# Patient Record
Sex: Female | Born: 1950 | Race: White | Hispanic: No | Marital: Married | Smoking: Former smoker
Health system: Southern US, Community
[De-identification: ages and names within clinical notes are randomized; demographics above are authoritative.]

## PROBLEM LIST (undated history)

## (undated) DIAGNOSIS — M4802 Spinal stenosis, cervical region: Secondary | ICD-10-CM

## (undated) DIAGNOSIS — M419 Scoliosis, unspecified: Secondary | ICD-10-CM

## (undated) DIAGNOSIS — G43909 Migraine, unspecified, not intractable, without status migrainosus: Secondary | ICD-10-CM

## (undated) DIAGNOSIS — K760 Fatty (change of) liver, not elsewhere classified: Secondary | ICD-10-CM

## (undated) DIAGNOSIS — E039 Hypothyroidism, unspecified: Secondary | ICD-10-CM

## (undated) DIAGNOSIS — I499 Cardiac arrhythmia, unspecified: Secondary | ICD-10-CM

## (undated) DIAGNOSIS — J449 Chronic obstructive pulmonary disease, unspecified: Secondary | ICD-10-CM

## (undated) DIAGNOSIS — E119 Type 2 diabetes mellitus without complications: Secondary | ICD-10-CM

## (undated) DIAGNOSIS — M5430 Sciatica, unspecified side: Secondary | ICD-10-CM

## (undated) DIAGNOSIS — F329 Major depressive disorder, single episode, unspecified: Secondary | ICD-10-CM

## (undated) DIAGNOSIS — K259 Gastric ulcer, unspecified as acute or chronic, without hemorrhage or perforation: Secondary | ICD-10-CM

## (undated) DIAGNOSIS — M1990 Unspecified osteoarthritis, unspecified site: Secondary | ICD-10-CM

## (undated) DIAGNOSIS — K219 Gastro-esophageal reflux disease without esophagitis: Secondary | ICD-10-CM

## (undated) DIAGNOSIS — R739 Hyperglycemia, unspecified: Secondary | ICD-10-CM

## (undated) DIAGNOSIS — J45909 Unspecified asthma, uncomplicated: Secondary | ICD-10-CM

## (undated) DIAGNOSIS — F32A Depression, unspecified: Secondary | ICD-10-CM

## (undated) DIAGNOSIS — E559 Vitamin D deficiency, unspecified: Secondary | ICD-10-CM

## (undated) DIAGNOSIS — E785 Hyperlipidemia, unspecified: Secondary | ICD-10-CM

## (undated) DIAGNOSIS — R011 Cardiac murmur, unspecified: Secondary | ICD-10-CM

## (undated) DIAGNOSIS — F419 Anxiety disorder, unspecified: Secondary | ICD-10-CM

## (undated) DIAGNOSIS — M069 Rheumatoid arthritis, unspecified: Secondary | ICD-10-CM

## (undated) DIAGNOSIS — E669 Obesity, unspecified: Secondary | ICD-10-CM

## (undated) DIAGNOSIS — IMO0002 Reserved for concepts with insufficient information to code with codable children: Secondary | ICD-10-CM

## (undated) DIAGNOSIS — T7840XA Allergy, unspecified, initial encounter: Secondary | ICD-10-CM

## (undated) HISTORY — DX: Migraine, unspecified, not intractable, without status migrainosus: G43.909

## (undated) HISTORY — DX: Sciatica, unspecified side: M54.30

## (undated) HISTORY — DX: Cardiac arrhythmia, unspecified: I49.9

## (undated) HISTORY — DX: Scoliosis, unspecified: M41.9

## (undated) HISTORY — DX: Vitamin D deficiency, unspecified: E55.9

## (undated) HISTORY — DX: Gastric ulcer, unspecified as acute or chronic, without hemorrhage or perforation: K25.9

## (undated) HISTORY — DX: Allergy, unspecified, initial encounter: T78.40XA

## (undated) HISTORY — DX: Obesity, unspecified: E66.9

## (undated) HISTORY — DX: Major depressive disorder, single episode, unspecified: F32.9

## (undated) HISTORY — DX: Chronic obstructive pulmonary disease, unspecified: J44.9

## (undated) HISTORY — DX: Unspecified osteoarthritis, unspecified site: M19.90

## (undated) HISTORY — PX: UPPER GI ENDOSCOPY: SHX6162

## (undated) HISTORY — DX: Hyperglycemia, unspecified: R73.9

## (undated) HISTORY — DX: Hypothyroidism, unspecified: E03.9

## (undated) HISTORY — PX: COLONOSCOPY: SHX174

## (undated) HISTORY — DX: Reserved for concepts with insufficient information to code with codable children: IMO0002

## (undated) HISTORY — DX: Anxiety disorder, unspecified: F41.9

## (undated) HISTORY — PX: POLYPECTOMY: SHX149

## (undated) HISTORY — DX: Hyperlipidemia, unspecified: E78.5

## (undated) HISTORY — DX: Fatty (change of) liver, not elsewhere classified: K76.0

## (undated) HISTORY — PX: COLOSTOMY: SHX63

## (undated) HISTORY — PX: CORNEAL TRANSPLANT: SHX108

## (undated) HISTORY — DX: Unspecified asthma, uncomplicated: J45.909

## (undated) HISTORY — PX: UPPER GASTROINTESTINAL ENDOSCOPY: SHX188

## (undated) HISTORY — PX: APPENDECTOMY: SHX54

## (undated) HISTORY — DX: Depression, unspecified: F32.A

## (undated) HISTORY — DX: Rheumatoid arthritis, unspecified: M06.9

---

## 1984-12-02 HISTORY — PX: OTHER SURGICAL HISTORY: SHX169

## 1987-12-03 HISTORY — PX: TOTAL ABDOMINAL HYSTERECTOMY: SHX209

## 1999-03-01 ENCOUNTER — Encounter: Payer: Self-pay | Admitting: *Deleted

## 1999-03-01 ENCOUNTER — Ambulatory Visit (HOSPITAL_COMMUNITY): Admission: RE | Admit: 1999-03-01 | Discharge: 1999-03-01 | Payer: Self-pay | Admitting: *Deleted

## 1999-03-08 ENCOUNTER — Ambulatory Visit (HOSPITAL_COMMUNITY): Admission: RE | Admit: 1999-03-08 | Discharge: 1999-03-08 | Payer: Self-pay | Admitting: *Deleted

## 1999-05-26 ENCOUNTER — Emergency Department (HOSPITAL_COMMUNITY): Admission: EM | Admit: 1999-05-26 | Discharge: 1999-05-26 | Payer: Self-pay

## 2000-02-25 ENCOUNTER — Encounter: Payer: Self-pay | Admitting: Emergency Medicine

## 2000-02-25 ENCOUNTER — Emergency Department (HOSPITAL_COMMUNITY): Admission: EM | Admit: 2000-02-25 | Discharge: 2000-02-25 | Payer: Self-pay | Admitting: Emergency Medicine

## 2000-04-03 ENCOUNTER — Emergency Department (HOSPITAL_COMMUNITY): Admission: EM | Admit: 2000-04-03 | Discharge: 2000-04-03 | Payer: Self-pay | Admitting: Emergency Medicine

## 2000-04-04 ENCOUNTER — Encounter: Payer: Self-pay | Admitting: Emergency Medicine

## 2000-04-04 ENCOUNTER — Emergency Department (HOSPITAL_COMMUNITY): Admission: EM | Admit: 2000-04-04 | Discharge: 2000-04-04 | Payer: Self-pay | Admitting: Emergency Medicine

## 2001-02-12 ENCOUNTER — Other Ambulatory Visit: Admission: RE | Admit: 2001-02-12 | Discharge: 2001-02-12 | Payer: Self-pay | Admitting: Obstetrics & Gynecology

## 2001-02-12 ENCOUNTER — Encounter (INDEPENDENT_AMBULATORY_CARE_PROVIDER_SITE_OTHER): Payer: Self-pay

## 2002-12-02 ENCOUNTER — Emergency Department (HOSPITAL_COMMUNITY): Admission: EM | Admit: 2002-12-02 | Discharge: 2002-12-02 | Payer: Self-pay | Admitting: Emergency Medicine

## 2002-12-29 ENCOUNTER — Other Ambulatory Visit: Admission: RE | Admit: 2002-12-29 | Discharge: 2002-12-29 | Payer: Self-pay | Admitting: Obstetrics & Gynecology

## 2003-02-09 ENCOUNTER — Encounter: Payer: Self-pay | Admitting: Internal Medicine

## 2003-02-09 ENCOUNTER — Encounter: Admission: RE | Admit: 2003-02-09 | Discharge: 2003-02-09 | Payer: Self-pay | Admitting: Internal Medicine

## 2003-09-12 ENCOUNTER — Ambulatory Visit (HOSPITAL_COMMUNITY): Admission: RE | Admit: 2003-09-12 | Discharge: 2003-09-12 | Payer: Self-pay | Admitting: *Deleted

## 2003-09-12 ENCOUNTER — Encounter (INDEPENDENT_AMBULATORY_CARE_PROVIDER_SITE_OTHER): Payer: Self-pay | Admitting: Specialist

## 2004-12-02 HISTORY — PX: HAND SURGERY: SHX662

## 2005-10-04 ENCOUNTER — Encounter: Admission: RE | Admit: 2005-10-04 | Discharge: 2005-10-04 | Payer: Self-pay | Admitting: Internal Medicine

## 2005-10-18 ENCOUNTER — Ambulatory Visit (HOSPITAL_COMMUNITY): Admission: RE | Admit: 2005-10-18 | Discharge: 2005-10-18 | Payer: Self-pay | Admitting: Orthopedic Surgery

## 2005-10-18 ENCOUNTER — Encounter (INDEPENDENT_AMBULATORY_CARE_PROVIDER_SITE_OTHER): Payer: Self-pay | Admitting: *Deleted

## 2005-10-18 ENCOUNTER — Ambulatory Visit (HOSPITAL_BASED_OUTPATIENT_CLINIC_OR_DEPARTMENT_OTHER): Admission: RE | Admit: 2005-10-18 | Discharge: 2005-10-18 | Payer: Self-pay | Admitting: Orthopedic Surgery

## 2007-02-20 ENCOUNTER — Encounter: Admission: RE | Admit: 2007-02-20 | Discharge: 2007-02-20 | Payer: Self-pay | Admitting: Endocrinology

## 2008-03-25 ENCOUNTER — Encounter: Admission: RE | Admit: 2008-03-25 | Discharge: 2008-03-25 | Payer: Self-pay | Admitting: Internal Medicine

## 2008-05-30 ENCOUNTER — Encounter (INDEPENDENT_AMBULATORY_CARE_PROVIDER_SITE_OTHER): Payer: Self-pay | Admitting: *Deleted

## 2008-05-30 ENCOUNTER — Ambulatory Visit (HOSPITAL_COMMUNITY): Admission: RE | Admit: 2008-05-30 | Discharge: 2008-05-30 | Payer: Self-pay | Admitting: *Deleted

## 2008-06-24 ENCOUNTER — Encounter: Admission: RE | Admit: 2008-06-24 | Discharge: 2008-06-24 | Payer: Self-pay | Admitting: Internal Medicine

## 2008-10-21 ENCOUNTER — Encounter: Admission: RE | Admit: 2008-10-21 | Discharge: 2008-10-21 | Payer: Self-pay | Admitting: Internal Medicine

## 2008-11-30 ENCOUNTER — Ambulatory Visit (HOSPITAL_COMMUNITY): Admission: RE | Admit: 2008-11-30 | Discharge: 2008-11-30 | Payer: Self-pay | Admitting: Internal Medicine

## 2008-12-02 HISTORY — PX: CHOLECYSTECTOMY: SHX55

## 2009-01-25 ENCOUNTER — Encounter: Admission: RE | Admit: 2009-01-25 | Discharge: 2009-01-25 | Payer: Self-pay | Admitting: Internal Medicine

## 2009-02-08 ENCOUNTER — Ambulatory Visit (HOSPITAL_COMMUNITY): Admission: RE | Admit: 2009-02-08 | Discharge: 2009-02-09 | Payer: Self-pay | Admitting: Surgery

## 2009-02-08 ENCOUNTER — Encounter (INDEPENDENT_AMBULATORY_CARE_PROVIDER_SITE_OTHER): Payer: Self-pay | Admitting: Surgery

## 2009-05-08 ENCOUNTER — Encounter: Admission: RE | Admit: 2009-05-08 | Discharge: 2009-05-08 | Payer: Self-pay | Admitting: Internal Medicine

## 2009-07-06 ENCOUNTER — Encounter: Admission: RE | Admit: 2009-07-06 | Discharge: 2009-07-06 | Payer: Self-pay | Admitting: Internal Medicine

## 2009-08-30 ENCOUNTER — Encounter: Admission: RE | Admit: 2009-08-30 | Discharge: 2009-08-30 | Payer: Self-pay | Admitting: Internal Medicine

## 2009-12-14 ENCOUNTER — Encounter: Admission: RE | Admit: 2009-12-14 | Discharge: 2009-12-14 | Payer: Self-pay | Admitting: Internal Medicine

## 2011-03-14 LAB — DIFFERENTIAL
Basophils Absolute: 0.1 10*3/uL (ref 0.0–0.1)
Basophils Relative: 1 % (ref 0–1)
Eosinophils Absolute: 0.1 10*3/uL (ref 0.0–0.7)
Eosinophils Relative: 1 % (ref 0–5)
Lymphocytes Relative: 18 % (ref 12–46)
Lymphs Abs: 1.6 10*3/uL (ref 0.7–4.0)
Monocytes Absolute: 0.7 10*3/uL (ref 0.1–1.0)
Monocytes Relative: 8 % (ref 3–12)
Neutro Abs: 6.4 10*3/uL (ref 1.7–7.7)
Neutrophils Relative %: 73 % (ref 43–77)

## 2011-03-14 LAB — URINE MICROSCOPIC-ADD ON

## 2011-03-14 LAB — COMPREHENSIVE METABOLIC PANEL
ALT: 21 U/L (ref 0–35)
AST: 21 U/L (ref 0–37)
Albumin: 3.9 g/dL (ref 3.5–5.2)
Alkaline Phosphatase: 115 U/L (ref 39–117)
BUN: 15 mg/dL (ref 6–23)
CO2: 26 mEq/L (ref 19–32)
Calcium: 9.2 mg/dL (ref 8.4–10.5)
Chloride: 101 mEq/L (ref 96–112)
Creatinine, Ser: 0.67 mg/dL (ref 0.4–1.2)
GFR calc Af Amer: >60 mL/min (ref >60)
GFR calc non Af Amer: >60 mL/min (ref >60)
Glucose, Bld: 106 mg/dL — ABNORMAL HIGH (ref 70–99)
Potassium: 4.3 mEq/L (ref 3.5–5.1)
Sodium: 136 mEq/L (ref 135–145)
Total Bilirubin: 0.8 mg/dL (ref 0.3–1.2)
Total Protein: 6.8 g/dL (ref 6.0–8.3)

## 2011-03-14 LAB — URINALYSIS, ROUTINE W REFLEX MICROSCOPIC
Glucose, UA: NEGATIVE mg/dL
Ketones, ur: NEGATIVE mg/dL
Leukocytes, UA: NEGATIVE
Nitrite: NEGATIVE
Protein, ur: NEGATIVE mg/dL
Specific Gravity, Urine: 1.024 (ref 1.005–1.030)
Urobilinogen, UA: 1 mg/dL (ref 0.0–1.0)
pH: 6 (ref 5.0–8.0)

## 2011-03-14 LAB — CBC
HCT: 44.1 % (ref 36.0–46.0)
Hemoglobin: 15.1 g/dL — ABNORMAL HIGH (ref 12.0–15.0)
MCHC: 34.3 g/dL (ref 30.0–36.0)
MCV: 98 fL (ref 78.0–100.0)
Platelets: 281 10*3/uL (ref 150–400)
RBC: 4.5 MIL/uL (ref 3.87–5.11)
RDW: 12.8 % (ref 11.5–15.5)
WBC: 8.7 10*3/uL (ref 4.0–10.5)

## 2011-03-14 LAB — LIPASE, BLOOD: Lipase: 15 U/L (ref 11–59)

## 2011-04-16 NOTE — Op Note (Signed)
NAME:  Jessica [name], Jessica [name]                  ACCOUNT NO.:  [id number]   MEDICAL RECORD NO.:  [id number]          PATIENT TYPE:  OIB   LOCATION:  1535                         FACILITY:  [facility]   PHYSICIAN:  Currie Paris, M.D.DATE OF BIRTH:  [birth year]   DATE OF PROCEDURE:  02/08/2009  DATE OF DISCHARGE:                               OPERATIVE REPORT   OFFICE MEDICAL RECORD NUMBER CCS [phone].   PREOPERATIVE DIAGNOSIS:  Chronic calculus cholecystitis.   POSTOPERATIVE DIAGNOSIS:  Chronic calculus cholecystitis.   OPERATION:  Laparoscopic cholecystectomy with operative cholangiogram.   SURGEON:  Dr. Jamey Ripa.   ASSISTANT:  Dr. Luisa Hart.   ANESTHESIA:  General.   CLINICAL HISTORY:  This is a 60 year old lady with multiple abdominal  symptoms who has been evaluated.  She has both greasy and spicy food  intolerance.  An ultrasound has showed gallstones, and she elected to  proceed to cholecystectomy.   DESCRIPTION OF PROCEDURE:  The patient was seen in the holding area, and  she had no further questions.  We confirmed cholecystectomy was the  planned procedure.   She was taken to the operating room, and after satisfactory general  endotracheal anesthesia had been obtained, the abdomen was prepped and  draped.  The time out was done.   I infiltrated the umbilical area with 0.25% plain Marcaine, made an  incision, identified the fascia and entered the peritoneal cavity under  direct vision.  There were no significant adhesions noted, and the  Hasson was easily placed and held with a pursestring.   The abdomen was insufflated to 15 and the patient tilted into reverse  Trendelenburg and to the left.  There were a few omental adhesions to  the falciform from an old upper midline scar but virtually no other  adhesions that would prevent injury, so I placed a 10/11 trocar in the  epigastrium and two 5-mm laterally.   The gallbladder was contracted and chronically scarred, consistent with  a  chronic cholecystitis.  There were multiple omental adhesions, and  these were all taken down with cautery.  I was then able to dissect in  the area of the triangle of Calot and could identify the cystic artery  which looked like it was lying just anterior to the cystic duct and  obscuring it.  I freed up the peritoneum so that I had a nice big window  and then went ahead and divided the cystic artery up on the gallbladder  where I could clearly see the anatomy.  With that done, I found another  small branch which I clipped and divided and was able to dissect out a  long segment of cystic duct.   Duct was clipped at the junction of the gallbladder and opened.  A Cook  catheter was introduced and operative cholangiogram done which was  normal.   The cholangiogram catheter was removed and 2 clips placed on the stay  side of the cystic duct, and it was divided.  A couple small branches of  the artery up on the gallbladder were clipped as we removed the  gallbladder from below to above.   The gallbladder was placed in a bag.  We checked to make sure everything  was dry and then brought the gallbladder out the umbilical site.  Final  irrigation was done, and then the lateral ports were removed under  direct vision with no bleeding.  The umbilical site was closed with a  pursestring.  The abdomen was deflated through the epigastric site.  Skin was closed with 4-0 Monocryl subcuticular and Dermabond.   The patient tolerated the procedure well, and there were no  complications.  All counts were correct.      Currie Paris, M.D.  Electronically Signed     CJS/MEDQ  D:  02/08/2009  T:  02/08/2009  Job:  [id number]   cc:   Massie Maroon, MD  Fax: [phone]

## 2011-04-16 NOTE — Op Note (Signed)
NAME:  Jessica [name], Jessica [name]                  ACCOUNT NO.:  [id number]   MEDICAL RECORD NO.:  [id number]          PATIENT TYPE:  AMB   LOCATION:  ENDO                         FACILITY:  [facility]   PHYSICIAN:  Georgiana Spinner, M.D.    DATE OF BIRTH:  06/29/1951   DATE OF PROCEDURE:  05/30/2008  DATE OF DISCHARGE:                               OPERATIVE REPORT   PROCEDURE:  Colonoscopy.   INDICATIONS:  Colon polyps.   ANESTHESIA:  Fentanyl 75 mcg, Versed 5 mg.   PROCEDURE:  With the patient mildly sedated in the left lateral  decubitus position, the Pentax videoscopic pediatric colonoscope was  inserted in the rectum and passed under direct vision to the cecum  identified by the ileocecal valve and appendiceal orifice, both which  were photographed.  From this point the colonoscope was slowly withdrawn  taking circumferential views of colonic mucosa, stopping to photograph  some diverticula seen along until we reached the rectum which appeared  normal on direct and showed hemorrhoids on retroflexed view.  The  endoscope was straightened and withdrawn.  The patient's vital signs and  pulse oximeter remained stable.  The patient tolerated procedure well  without apparent complications.   FINDINGS:  1. Diverticulosis of sigmoid colon.  2. Internal hemorrhoids.   Otherwise unremarkable exam.   PLAN:  Repeat examination in 5 years           ______________________________  Georgiana Spinner, M.D.     GMO/MEDQ  D:  05/30/2008  T:  05/30/2008  Job:  [id number]

## 2011-04-16 NOTE — Op Note (Signed)
NAME:  Jessica [name], Jessica [name]                  ACCOUNT NO.:  [id number]   MEDICAL RECORD NO.:  [id number]          PATIENT TYPE:  AMB   LOCATION:  ENDO                         FACILITY:  [facility]   PHYSICIAN:  Georgiana Spinner, M.D.    DATE OF BIRTH:  June 22, 1951   DATE OF PROCEDURE:  05/30/2008  DATE OF DISCHARGE:                               OPERATIVE REPORT   PROCEDURE:  Upper endoscopy.   INDICATIONS:  Abdominal pain.   ANESTHESIA:  Fentanyl 75 mcg, Versed 7 mg.   PROCEDURE:  With the patient mildly sedated in the left lateral  decubitus position, the Pentax videoscopic endoscope was inserted in the  mouth, passed under direct vision through the esophagus which appeared  normal.  There was 1 area that was questionable for Barrett's which I  elected to biopsy because the esophagus would not fully distend to allow  me to see if this was normal variant.  Subsequently we entered into the  stomach.  Fundus, body, antrum, duodenal bulb, second portion duodenum  all appeared normal.  From this point, the endoscope was slowly  withdrawn taking circumferential views of duodenal mucosa until the  endoscope had been pulled back into stomach, placed in retroflexion to  view the stomach from below.  The endoscope was straightened and  withdrawn taking circumferential views of the remaining gastric and  esophageal mucosa.  The patient's vital signs and pulse oximeter  remained stable.  The patient tolerated procedure well without apparent  complications.   FINDINGS:  Question of Barrett's esophagus versus normal.  Await biopsy  report.  The patient will call me for results and follow up with me as  an outpatient.  Proceed to colonoscopy as planned.           ______________________________  Georgiana Spinner, M.D.     GMO/MEDQ  D:  05/30/2008  T:  05/30/2008  Job:  [id number]

## 2011-04-19 NOTE — Op Note (Signed)
   NAME:  Jessica [name], Jessica [name]                            ACCOUNT NO.:  [id number]   MEDICAL RECORD NO.:  [id number]                   PATIENT TYPE:  AMB   LOCATION:  ENDO                                 FACILITY:  [facility]   PHYSICIAN:  Georgiana Spinner, M.D.                 DATE OF BIRTH:  1951/04/24   DATE OF PROCEDURE:  DATE OF DISCHARGE:                                 OPERATIVE REPORT   PROCEDURE:  Colonoscopy.   ENDOSCOPIST:  Georgiana Spinner, M.D.   ANESTHESIA:  Demerol 40, Versed 4 mg.   INDICATIONS FOR PROCEDURE:  Colon polyp.   DESCRIPTION OF PROCEDURE:  With the patient mildly sedated in the left  lateral decubitus position, the Olympus videoscope colonoscope was inserted  in the rectum and passed under direct vision into the cecum, identified by  the ileocecal valve and appendiceal orifice, both of which were  photographed. In the cecum was [name] small polyp, which was removed using hot  biopsy forceps technique, set on [name] 20/150 on the [employer]  generator.  The endoscope was then withdrawn, taking circumferential views  of the remaining colonic mucosa, stopping only into the rectum, which  appeared normal on direct and showed hemorrhoids on retroflex view. The  endoscope was straightened and withdrawn.  The patient's vital signs and  pulse oximetry remained stable.  The patient tolerated the procedure well,  there were no apparent complications.   FINDINGS:  Polyp of cecum and internal hemorrhoids, otherwise unremarkable  exam.   PLAN:  Patient will call me for results of biopsy and follow up with me as  an outpatient.                                                   Georgiana Spinner, M.D.    GMO/MEDQ  D:  09/12/2003  T:  09/12/2003  Job:  [id number]

## 2011-04-19 NOTE — Op Note (Signed)
   NAME:  Jessica [name], Seerat A                            ACCOUNT NO.:  [id number]   MEDICAL RECORD NO.:  [id number]                   PATIENT TYPE:  AMB   LOCATION:  ENDO                                 FACILITY:  [facility]   PHYSICIAN:  Georgiana Spinner, M.D.                 DATE OF BIRTH:  02-22-1951   DATE OF PROCEDURE:  DATE OF DISCHARGE:                                 OPERATIVE REPORT   PROCEDURE:  Upper endoscopy.   INDICATION:  GERD.   ANESTHESIA:  Demerol 120 mg, Versed 12 mg.   DESCRIPTION OF PROCEDURE:  With the patient mildly sedated in the left  lateral decubitus position, the Olympus videoscopic endoscope was inserted  into the mouth and passed under direct vision through the esophagus which  appeared normal.  There was no evidence of Barrett's.  We entered into the  stomach and the fundus, body, antrum, duodenal bulb, and second portion of  the duodenum all appeared normal.  From this point, the endoscope was slowly  withdrawn, taking circumferential views of the duodenal mucosa until the  endoscope then pulled back into the stomach and placed in retroflexion,  viewing the stomach from below.  The endoscope was straightened and  withdrawn, taking circumferential views of the remaining gastric and  esophageal mucosa.  The patient's vital signs and pulse oximetry remained  stable and the patient tolerated the procedure well without apparent  complications.   FINDINGS:  Unremarkable examination.   PLAN:  Proceed to colonoscopy.                                               Georgiana Spinner, M.D.    GMO/MEDQ  D:  09/12/2003  T:  09/12/2003  Job:  [id number]

## 2011-04-19 NOTE — Op Note (Signed)
NAME:  Jessica [name], Jessica [name]                  ACCOUNT NO.:  [id number]   MEDICAL RECORD NO.:  [id number]          PATIENT TYPE:  AMB   LOCATION:  DSC                          FACILITY:  [facility]   PHYSICIAN:  Katy Fitch. Sypher, M.D. DATE OF BIRTH:  [birth year]   DATE OF PROCEDURE:  10/18/2005  DATE OF DISCHARGE:                                 OPERATIVE REPORT   PREOPERATIVE DIAGNOSIS:  Three month history of swelling right long finger  metacarpal phalangeal joint with periarticular edema stiffness and swelling  consistent with synovitis; rule out seronegative rheumatoid arthritis versus  psoriatic arthritis.   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   OPERATION:  Arthrotomy of right long finger metacarpal phalangeal joint with  incidental Z lengthening of ulnar sagittal fibers to realign extensor  mechanism.  1.  Cheilectomy of metacarpal head.  2.  Injection of right long finger metacarpal phalangeal joint utilizing      Depo-Medrol and 1% lidocaine.   OPERATING SURGEON:  Katy Fitch. Sypher, M.D.   ASSISTANT:  Molly Maduro Dasnoit PA-C.   ANESTHESIA:  General supervised by the anesthesiologist, Dr. Adonis Huguenin.   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   She was referred by Dr. Syliva Overman of [facility] for  evaluation and management of chronic right long finger swelling.   Dr. Jimmy Footman had followed her for two years and had noted polyarthropathy  with signs and symptoms compatible with a possible seronegative rheumatoid  arthritis or possible psoriatic arthritis.   Jessica [name] had been on the previous course of a Azulfidine without  satisfactory release of her symptoms.   During the past three months she developed marked swelling in the region of  her right long  finger metacarpal phalangeal joint with loss of range of  motion in both flexion and extension.  She began to develop slight ulnar  drift of metacarpal phalangeal joint and a slight ulnar translocation of her  extensor mechanism with intrinsic tightness.   She was recently seen by Dr. Jimmy Footman for a consult on September 30, 2005 and  at that time was noted to have the swelling and stiffness of her long finger  metacarpal phalangeal joint and the absence of trauma.   Dr. Jimmy Footman ordered an MRI of the right hand without contrast on October 04, 2005.   This was interpreted by Dr. Jena Gauss to reveal probable osteoarthrosis of the  long finger metacarpal phalangeal joint with significant synovitis and  interosseous ganglion or interosseous cyst.   Due to these findings Dr. Jimmy Footman referred Jessica [name] for a surgical  evaluation.   Essentially, the question was to further define the nature her arthritis  disorder and possibly obtain tissue to confirm whether or not she had an  inflammatory arthropathy.   She was seen in consultation on October 16, 2005, and we reviewed at the  time plain films from 2004 and 2006 as well as her MRI films.  In 2004, Jessica [name] had normal bony anatomy on her plain films of both hands.  On films of 2006, she was noted have cystic change in the ulnar aspect of  her long finger metacarpal head of the right hand and squaring of her  condyle consistent with either  post-traumatic early osteoarthrosis.   Her MRI was remarkable for some considerable periarticular edema and what  appeared to be synovitis and erosive synovitis beneath the ulnar collateral  ligament.   Given the fact that she was developing intrinsic tightness and ulnar drift  there was significant concern that she may indeed have an inflammatory  arthritis.   Dr. Jimmy Footman had advised to consider methotrexate therapy, however, as she  was concerned about possible side effects of methotrexate  there is great  value in determining the nature her arthritis at this time prior to  initiating a particular medical regimen.   After informed consent, she is brought to the operating at this time  anticipating synovial biopsy in an effort to confirm whether not she has an  inflammatory arthropathy.   Preoperatively questions were invited and answered in detail.   DESCRIPTION OF PROCEDURE:  Hiral Lukasiewicz was brought to the operating room and  placed in supine position on the table.   Following anesthesia consultation with Dr. Krista Blue, general anesthesia by LMA  technique was induced.   Examination of the right arm was performed under anesthesia.  The intrinsic  tightness noted in the office was still present but rather than 2+ was now  1+.  She indeed had an ulnar position of her extensor mechanism over the  apex of the long finger metacarpal head and appeared to have increased  tension and her ulnar intrinsic an ulnar sagittal fibers.  She had palpable  osteophytes and synovium within the joint.   After routine Betadine scrub and paint, the right arm was draped with  sterile stockinette and impervious arthroscopy drapes. Ancef 1 gram was  administered as an IV prophylactic antibiotic.  The right arm was  exsanguinated with Esmarch bandage and arterial tourniquet proximal brachium  inflated to 220 mmHg.   The procedure commenced with a short curvilinear scission centered over the  MP joint. The subcutaneous tissues were divided taking care to spare the  cutaneous sensory branches.  The sagittal fibers were carefully inspected  and found be indeed contracted on the ulnar aspect of the MP joint. There  was noted to be a palpable osteophyte on the dorsal ulnar aspect of the  metacarpal head consistent with early osteoarthrosis of post-traumatic  arthrosis.  A step-cut of the ulnar sagittal fibers was accomplished to relieve tension  in the sagittal fibers followed by capsulotomy on the  ulnar aspect of the  joint.  A cheilectomy was performed removing the prominent osteophyte which  was contributing to the displacement of the sagittal fibers.   A complete synovectomy of the MP joint was accomplished utilizing a  microcurette and a fine rongeur. The area deep to the ulnar collateral  ligament origin was carefully inspected. There was synovitis present at this  level that appeared to be mildly inflammatory but not typical of rheumatoid  arthritis.   The typical rheumatoid pannus was not present.   All synovium removed was placed in formalin and passed off for pathologic  evaluation.   The MP joint was then lavaged with a mixture of 1% lidocaine and Depo-Medrol  40 mg/mL followed by repair the capsule with mattress suture of 3-0 Ethibond  and repair of the ulnar sagittal fibers lengthening the sagittal fibers  approximately 3 mm with a step-cut technique.  The long finger extensor was  more centralized over the metacarpal head after these interventions.   The intrinsic tightness was relieved by simple manipulation with  hyperextension of the MP joint and full flexion of the PIP joint.   The wound was then repaired with intradermal 3-0 Prolene followed by  application of voluminous gauze dressing.   For aftercare, we will encourage Jessica [name] to begin immediate range of  motion excises. She is provided postoperative analgesic medication in the  form of Vicodin 5 mg one p.o. q.4-6 hours p.r.n. pain, 20 tablets without  refill.  She will continue in the usual analgesics.   The provisional diagnosis after exploration is that this is a post traumatic  arthrosis rather than inflammatory arthrosis.  This may render use of  methotrexate and other significant anti-inflammatory medications  unnecessary.   At some point in the future, Ms. Eschete may benefit from reconstruction of her  arthritis with an implant arthroplasty.      Katy Fitch Sypher, M.D.  Electronically  Signed     RVS/MEDQ  D:  10/18/2005  T:  10/19/2005  Job:  [id number]   cc:   Lemmie Evens, M.D.  Fax: [phone]

## 2011-08-16 ENCOUNTER — Other Ambulatory Visit: Payer: Self-pay | Admitting: Internal Medicine

## 2011-08-16 DIAGNOSIS — R945 Abnormal results of liver function studies: Secondary | ICD-10-CM

## 2013-02-02 ENCOUNTER — Other Ambulatory Visit: Payer: Self-pay | Admitting: Internal Medicine

## 2013-02-02 DIAGNOSIS — R945 Abnormal results of liver function studies: Secondary | ICD-10-CM

## 2013-03-08 ENCOUNTER — Inpatient Hospital Stay: Admission: RE | Admit: 2013-03-08 | Payer: Self-pay | Source: Ambulatory Visit

## 2014-04-21 ENCOUNTER — Encounter: Payer: Self-pay | Admitting: Gastroenterology

## 2014-06-29 ENCOUNTER — Ambulatory Visit: Payer: Self-pay | Admitting: Gastroenterology

## 2016-07-25 ENCOUNTER — Other Ambulatory Visit: Payer: Self-pay | Admitting: Pain Medicine

## 2016-07-25 DIAGNOSIS — M542 Cervicalgia: Secondary | ICD-10-CM

## 2016-08-03 ENCOUNTER — Ambulatory Visit
Admission: RE | Admit: 2016-08-03 | Discharge: 2016-08-03 | Disposition: A | Discharge status: Home / Self Care | Payer: 59 | Source: Ambulatory Visit | Attending: Pain Medicine | Admitting: Pain Medicine

## 2016-08-03 DIAGNOSIS — M542 Cervicalgia: Secondary | ICD-10-CM

## 2017-01-03 ENCOUNTER — Other Ambulatory Visit: Payer: Self-pay | Admitting: Internal Medicine

## 2017-01-03 DIAGNOSIS — R109 Unspecified abdominal pain: Secondary | ICD-10-CM

## 2017-09-03 ENCOUNTER — Encounter: Payer: Self-pay | Admitting: Gastroenterology

## 2017-10-13 ENCOUNTER — Encounter: Payer: Self-pay | Admitting: Gastroenterology

## 2017-10-13 ENCOUNTER — Ambulatory Visit (INDEPENDENT_AMBULATORY_CARE_PROVIDER_SITE_OTHER): Payer: 59 | Admitting: Gastroenterology

## 2017-10-13 ENCOUNTER — Other Ambulatory Visit (INDEPENDENT_AMBULATORY_CARE_PROVIDER_SITE_OTHER): Payer: 59

## 2017-10-13 ENCOUNTER — Encounter (INDEPENDENT_AMBULATORY_CARE_PROVIDER_SITE_OTHER): Payer: Self-pay

## 2017-10-13 VITALS — BP 130/70 | HR 74 | Ht 66.0 in | Wt 193.1 lb

## 2017-10-13 DIAGNOSIS — R1030 Lower abdominal pain, unspecified: Secondary | ICD-10-CM

## 2017-10-13 DIAGNOSIS — R197 Diarrhea, unspecified: Secondary | ICD-10-CM

## 2017-10-13 DIAGNOSIS — Z86010 Personal history of colonic polyps: Secondary | ICD-10-CM

## 2017-10-13 LAB — CBC WITH DIFFERENTIAL/PLATELET
Basophils Absolute: 0.1 10*3/uL (ref 0.0–0.1)
Basophils Relative: 0.9 % (ref 0.0–3.0)
Eosinophils Absolute: 0 10*3/uL (ref 0.0–0.7)
Eosinophils Relative: 0.3 % (ref 0.0–5.0)
HCT: 42.3 % (ref 36.0–46.0)
Hemoglobin: 14.5 g/dL (ref 12.0–15.0)
Lymphocytes Relative: 15.9 % (ref 12.0–46.0)
Lymphs Abs: 1.4 10*3/uL (ref 0.7–4.0)
MCHC: 34.3 g/dL (ref 30.0–36.0)
MCV: 92.2 fl (ref 78.0–100.0)
Monocytes Absolute: 0.5 10*3/uL (ref 0.1–1.0)
Monocytes Relative: 5.4 % (ref 3.0–12.0)
Neutro Abs: 7 10*3/uL (ref 1.4–7.7)
Neutrophils Relative %: 77.5 % — ABNORMAL HIGH (ref 43.0–77.0)
Platelets: 251 10*3/uL (ref 150.0–400.0)
RBC: 4.59 Mil/uL (ref 3.87–5.11)
RDW: 13.7 % (ref 11.5–15.5)
WBC: 9 10*3/uL (ref 4.0–10.5)

## 2017-10-13 LAB — BASIC METABOLIC PANEL
BUN: 14 mg/dL (ref 6–23)
CO2: 26 mEq/L (ref 19–32)
Calcium: 9.2 mg/dL (ref 8.4–10.5)
Chloride: 100 mEq/L (ref 96–112)
Creatinine, Ser: 0.54 mg/dL (ref 0.40–1.20)
GFR: 120.7 mL/min (ref >60.00)
Glucose, Bld: 311 mg/dL — ABNORMAL HIGH (ref 70–99)
Potassium: 4.4 mEq/L (ref 3.5–5.1)
Sodium: 136 mEq/L (ref 135–145)

## 2017-10-13 LAB — TSH: TSH: 0.11 u[IU]/mL — ABNORMAL LOW (ref 0.35–4.50)

## 2017-10-13 LAB — HEPATIC FUNCTION PANEL
ALT: 21 U/L (ref 0–35)
AST: 20 U/L (ref 0–37)
Albumin: 4 g/dL (ref 3.5–5.2)
Alkaline Phosphatase: 92 U/L (ref 39–117)
Bilirubin, Direct: 0.1 mg/dL (ref 0.0–0.3)
Total Bilirubin: 0.5 mg/dL (ref 0.2–1.2)
Total Protein: 6.9 g/dL (ref 6.0–8.3)

## 2017-10-13 LAB — IGA: IgA: 132 mg/dL (ref 68–378)

## 2017-10-13 NOTE — Patient Instructions (Signed)
Your physician has requested that you go to the basement for lab work before leaving today.  Resume your Robinul twice daily. You already have to prescription at home.   Normal BMI (Body Mass Index- based on height and weight) is between 19 and 25. Your BMI today is Body mass index is 31.17 kg/m. Marland Kitchen Please consider follow up  regarding your BMI with your Primary Care Provider.  Thank you for choosing me and [facility].  Pricilla Riffle. Dagoberto Ligas., MD., Marval Regal

## 2017-10-13 NOTE — Progress Notes (Signed)
History of Present Illness: This is a 66 year old female referred by Jani Gravel, MD for the evaluation of diarrhea.  She relates intermittent diarrhea with periumbilical abdominal pain and then her bowels return to normal bowel pattern with a daily bowel movement without pain.  Diarrhea is often urgent and watery following a meal.  These symptoms have been present for about 6 months.  He has not been taking glycopyrrolate at all for the past several months. She was given an empiric course of Flagyl by Dr. Maudie Mercury however she does not feel her symptoms improved.  Her reflux symptoms are well controlled on omeprazole.  Denies weight loss, constipation, change in stool caliber, melena, hematochezia, nausea, vomiting, dysphagia, chest pain.   Colonoscopy 10/2003, Dr. Lajoyce Corners, colon polyp (adenoma), internal hemorrhoids.  EGD 10/2003, Dr. Lajoyce Corners, unremarkable. EGD 05/2008, Dr Lajoyce Corners, ? Barrett's, otherwise normal - no Barrett on pathology Colonoscopy 05/2008, Dr Lajoyce Corners, diverticulosis, internal hemorrhoids   Allergies  Allergen Reactions  . Remicade [Infliximab] Itching   Outpatient Medications Prior to Visit  Medication Sig Dispense Refill  . butalbital-acetaminophen-caffeine (FIORICET, ESGIC) 50-325-40 MG per tablet Take 1 tablet by mouth 3 (three) times daily as needed for headache.    . Calcium Carbonate-Vitamin D (CALCIUM-VITAMIN D) 500-200 MG-UNIT per tablet Take 1 tablet by mouth daily.    . cyclobenzaprine (FLEXERIL) 10 MG tablet Take 10 mg by mouth 2 (two) times daily as needed for muscle spasms.    . ergocalciferol (VITAMIN D2) 50000 UNITS capsule Take 50,000 Units by mouth once a week.    . Exenatide (BYDUREON [place]) Inject into the skin.    Marland Kitchen FLUoxetine (PROZAC) 40 MG capsule Take 60 mg by mouth daily.     . folic acid (FOLVITE) 1 MG tablet Take 1 mg by mouth daily.    Marland Kitchen gabapentin (NEURONTIN) 300 MG capsule Take 300 mg by mouth 2 (two) times daily.    Marland Kitchen glimepiride (AMARYL) 4 MG tablet Take 4 mg by  mouth daily with breakfast.    . glycopyrrolate (ROBINUL) 2 MG tablet Take 2 mg by mouth 2 (two) times daily.    Marland Kitchen HYDROcodone-acetaminophen (NORCO/VICODIN) 5-325 MG per tablet Take 1 tablet by mouth at bedtime.    Marland Kitchen levothyroxine (SYNTHROID, LEVOTHROID) 137 MCG tablet Take 137 mcg by mouth daily before breakfast.    . methotrexate (RHEUMATREX) 2.5 MG tablet Take 10 mg by mouth once a week. Caution:Chemotherapy. Protect from light.    . Multiple Vitamin (MULTIVITAMIN) tablet Take 1 tablet by mouth daily.    Marland Kitchen omeprazole (PRILOSEC) 20 MG capsule Take 20 mg by mouth daily.    . pioglitazone (ACTOS) 15 MG tablet Take 15 mg by mouth daily.    . predniSONE (DELTASONE) 10 MG tablet Take 10 mg by mouth daily with breakfast.    . propranolol ER (INDERAL LA) 120 MG 24 hr capsule Take 120 mg by mouth daily.    Marland Kitchen inFLIXimab (REMICADE) 100 MG injection Inject 10 mg/kg into the vein every 30 (thirty) days.    . simvastatin (ZOCOR) 40 MG tablet Take 40 mg by mouth daily.     No facility-administered medications prior to visit.    Past Medical History:  Diagnosis Date  . Asthma   . COPD (chronic obstructive pulmonary disease) ([place])   . DDD (degenerative disc disease)   . Depression   . Fatty liver   . Gastric ulcer   . Hyperglycemia   . Hyperlipidemia   . Hypothyroidism   .  Irregular heartbeat   . Migraines   . Multiple gastric ulcers   . Obesity   . Osteoarthritis   . Rheumatoid arthritis ([place])   . Sciatica   . Scoliosis   . Vitamin D deficiency    Past Surgical History:  Procedure Laterality Date  . APPENDECTOMY    . 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 Perforated Colon  1986   by Dr. Margot Chimes  . TOTAL ABDOMINAL HYSTERECTOMY  1989   Dr. Nori Riis   Social History   Socioeconomic History  . Marital status: Married    Spouse name: None  . Number of children: None  . Years of education: None  . Highest education level: None  Social Needs  .  Financial resource strain: None  . Food insecurity - worry: None  . Food insecurity - inability: None  . Transportation needs - medical: None  . Transportation needs - non-medical: None  Occupational History  . Occupation: Inpatient coordinator  Tobacco Use  . Smoking status: Current Every Day Smoker    Types: Cigarettes  . Smokeless tobacco: Never Used  Substance and Sexual Activity  . Alcohol use: No  . Drug use: No  . Sexual activity: None  Other Topics Concern  . None  Social History Narrative  . None   Family History  Problem Relation Age of Onset  . Heart attack Father   . Heart disease Mother   . Hypertension Mother   . Breast cancer Sister   . Diabetes Brother       Review of Systems: Pertinent positive and negative review of systems were noted in the above HPI section. All other review of systems were otherwise negative.   Physical Exam: General: Well developed, well nourished, no acute distress Head: Normocephalic and atraumatic Eyes:  sclerae anicteric, EOMI Ears: Normal auditory acuity Mouth: No deformity or lesions Neck: Supple, no masses or thyromegaly Lungs: Clear throughout to auscultation Heart: Regular rate and rhythm; no murmurs, rubs or bruits Abdomen: Soft, non tender and non distended. No masses, hepatosplenomegaly or hernias noted. Normal Bowel sounds Musculoskeletal: Symmetrical with no gross deformities  Skin: No lesions on visible extremities Pulses:  Normal pulses noted Extremities: No clubbing, cyanosis, edema or deformities noted Neurological: Alert oriented x 4, grossly nonfocal Cervical Nodes:  No significant cervical adenopathy Inguinal Nodes: No significant inguinal adenopathy Psychological:  Alert and cooperative. Normal mood and affect  Assessment and Recommendations:  1. Diarrhea and lower abdominal pain. Probable IBS. R/O infection, IBD, etc. CMP, CBC, TSH, tTG, IgA and GI pathogen panel.  Resume glycopyrrolate 2 mg twice  daily. RE in 2 months.  2.  Personal history of adenomatous colon polyps.  Overdue for surveillance colonoscopy.  Plan to proceed with colonoscopy at her return office visit.  3.  GERD.  Continue omeprazole 20 mg daily and follow standard antireflux measures.  cc: Jani Gravel, MD

## 2017-10-14 LAB — TISSUE TRANSGLUTAMINASE, IGA: (tTG) Ab, IgA: 1 U/mL

## 2017-10-15 ENCOUNTER — Other Ambulatory Visit: Payer: 59

## 2017-10-15 DIAGNOSIS — R1030 Lower abdominal pain, unspecified: Secondary | ICD-10-CM

## 2017-10-15 DIAGNOSIS — R197 Diarrhea, unspecified: Secondary | ICD-10-CM

## 2017-10-20 LAB — GASTROINTESTINAL PATHOGEN PANEL PCR
C. difficile Tox A/B, PCR: NOT DETECTED
Campylobacter, PCR: NOT DETECTED
Cryptosporidium, PCR: NOT DETECTED
E coli (ETEC) LT/ST PCR: NOT DETECTED
E coli (STEC) stx1/stx2, PCR: NOT DETECTED
E coli 0157, PCR: NOT DETECTED
Giardia lamblia, PCR: NOT DETECTED
Norovirus, PCR: NOT DETECTED
Rotavirus A, PCR: NOT DETECTED
Salmonella, PCR: NOT DETECTED
Shigella, PCR: NOT DETECTED

## 2017-10-29 ENCOUNTER — Other Ambulatory Visit: Payer: Self-pay | Admitting: Neurosurgery

## 2017-12-03 NOTE — Pre-Procedure Instructions (Signed)
Jessica [name]  12/03/2017      [facility] [address] [place], [place] [address] Phone: [phone] Fax: [phone]    Your procedure is scheduled on Mon. Jan. 7  Report to [facility] Admitting at 11:20 A.M.  Call this number if you have problems the morning of surgery:  [phone]   Remember:  Do not eat food or drink liquids after midnight on Sun. Jan. 6  Take these medicines the morning of surgery with A SIP OF WATER :cyclobenzaprine (flexeril), fluoxetine (prozac),gabapentin (neurontin), hydrocodone if needed,levothyroxine (synthroid), omeprazole(prilosec), prednisone (deltasone)                How to Manage Your Diabetes Before and After Surgery  Why is it important to control my blood sugar before and after surgery? . Improving blood sugar levels before and after surgery helps healing and can limit problems. . A way of improving blood sugar control is eating a healthy diet by: o  Eating less sugar and carbohydrates o  Increasing activity/exercise o  Talking with your doctor about reaching your blood sugar goals . High blood sugars (greater than 180 mg/dL) can raise your risk of infections and slow your recovery, so you will need to focus on controlling your diabetes during the weeks before surgery. . Make sure that the doctor who takes care of your diabetes knows about your planned surgery including the date and location.  How do I manage my blood sugar before surgery? . Check your blood sugar at least 4 times a day, starting 2 days before surgery, to make sure that the level is not too high or low. o Check your blood sugar the morning of your surgery when you wake up and every 2 hours until you get to the Short Stay unit. . If your blood sugar is less than 70 mg/dL, you will need to treat for low blood sugar: o Do not take insulin. o Treat a low blood sugar (less than 70 mg/dL) with  cup of  clear juice (cranberry or apple), 4 glucose tablets, OR glucose gel. Recheck blood sugar in 15 minutes after treatment (to make sure it is greater than 70 mg/dL). If your blood sugar is not greater than 70 mg/dL on recheck, call [phone] o  for further instructions. . Report your blood sugar to the short stay nurse when you get to Short Stay.  . If you are admitted to the hospital after surgery: o Your blood sugar will be checked by the staff and you will probably be given insulin after surgery (instead of oral diabetes medicines) to make sure you have good blood sugar levels. o The goal for blood sugar control after surgery is 80-180 mg/dL.      WHAT DO I DO ABOUT MY DIABETES MEDICATION?   Marland Kitchen Do not take oral diabetes medicines (pills) the morning of surgery.    Do not wear jewelry, make-up or nail polish.  Do not wear lotions, powders, or perfumes, or deodorant.  Do not shave 48 hours prior to surgery.  Men may shave face and neck.  Do not bring valuables to the hospital.  [facility] is not responsible for any belongings or valuables.  Contacts, dentures or bridgework may not be worn into surgery.  Leave your suitcase in the car.  After surgery it may be brought to your room.  For patients admitted to the hospital, discharge time  will be determined by your treatment team.  Patients discharged the day of surgery will not be allowed to drive home.   Special instructions:  [facility]- Preparing For Surgery  Before surgery, you can play an important role. Because skin is not sterile, your skin needs to be as free of germs as possible. You can reduce the number of germs on your skin by washing with CHG (chlorahexidine gluconate) Soap before surgery.  CHG is an antiseptic cleaner which kills germs and bonds with the skin to continue killing germs even after washing.  Please do not use if you have an allergy to CHG or antibacterial soaps. If your skin becomes reddened/irritated  stop using the CHG.  Do not shave (including legs and underarms) for at least 48 hours prior to first CHG shower. It is OK to shave your face.  Please follow these instructions carefully.   1. Shower the NIGHT BEFORE SURGERY and the MORNING OF SURGERY with CHG.   2. If you chose to wash your hair, wash your hair first as usual with your normal shampoo.  3. After you shampoo, rinse your hair and body thoroughly to remove the shampoo.  4. Use CHG as you would any other liquid soap. You can apply CHG directly to the skin and wash gently with a scrungie or a clean washcloth.   5. Apply the CHG Soap to your body ONLY FROM THE NECK DOWN.  Do not use on open wounds or open sores. Avoid contact with your eyes, ears, mouth and genitals (private parts). Wash Face and genitals (private parts)  with your normal soap.  6. Wash thoroughly, paying special attention to the area where your surgery will be performed.  7. Thoroughly rinse your body with warm water from the neck down.  8. DO NOT shower/wash with your normal soap after using and rinsing off the CHG Soap.  9. Pat yourself dry with a CLEAN TOWEL.  10. Wear CLEAN PAJAMAS to bed the night before surgery, wear comfortable clothes the morning of surgery  11. Place CLEAN SHEETS on your bed the night of your first shower and DO NOT SLEEP WITH PETS.    Day of Surgery: Do not apply any deodorants/lotions. Please wear clean clothes to the hospital/surgery center.      Please read over the following fact sheets that you were given. Coughing and Deep Breathing and Surgical Site Infection Prevention

## 2017-12-04 ENCOUNTER — Encounter (HOSPITAL_COMMUNITY)
Admission: RE | Admit: 2017-12-04 | Discharge: 2017-12-04 | Disposition: A | Discharge status: Home / Self Care | Payer: 59 | Source: Ambulatory Visit | Attending: Neurosurgery | Admitting: Neurosurgery

## 2017-12-04 ENCOUNTER — Encounter (HOSPITAL_COMMUNITY): Payer: Self-pay | Admitting: *Deleted

## 2017-12-04 ENCOUNTER — Other Ambulatory Visit: Payer: Self-pay

## 2017-12-04 DIAGNOSIS — K760 Fatty (change of) liver, not elsewhere classified: Secondary | ICD-10-CM | POA: Diagnosis not present

## 2017-12-04 DIAGNOSIS — Z9049 Acquired absence of other specified parts of digestive tract: Secondary | ICD-10-CM | POA: Diagnosis not present

## 2017-12-04 DIAGNOSIS — G43909 Migraine, unspecified, not intractable, without status migrainosus: Secondary | ICD-10-CM | POA: Diagnosis not present

## 2017-12-04 DIAGNOSIS — M419 Scoliosis, unspecified: Secondary | ICD-10-CM | POA: Diagnosis not present

## 2017-12-04 DIAGNOSIS — Z8711 Personal history of peptic ulcer disease: Secondary | ICD-10-CM | POA: Insufficient documentation

## 2017-12-04 DIAGNOSIS — E119 Type 2 diabetes mellitus without complications: Secondary | ICD-10-CM | POA: Insufficient documentation

## 2017-12-04 DIAGNOSIS — M069 Rheumatoid arthritis, unspecified: Secondary | ICD-10-CM | POA: Diagnosis not present

## 2017-12-04 DIAGNOSIS — Z7984 Long term (current) use of oral hypoglycemic drugs: Secondary | ICD-10-CM | POA: Insufficient documentation

## 2017-12-04 DIAGNOSIS — K219 Gastro-esophageal reflux disease without esophagitis: Secondary | ICD-10-CM | POA: Diagnosis not present

## 2017-12-04 DIAGNOSIS — Z79899 Other long term (current) drug therapy: Secondary | ICD-10-CM | POA: Insufficient documentation

## 2017-12-04 DIAGNOSIS — J449 Chronic obstructive pulmonary disease, unspecified: Secondary | ICD-10-CM | POA: Diagnosis not present

## 2017-12-04 DIAGNOSIS — E039 Hypothyroidism, unspecified: Secondary | ICD-10-CM | POA: Insufficient documentation

## 2017-12-04 DIAGNOSIS — Z90710 Acquired absence of both cervix and uterus: Secondary | ICD-10-CM | POA: Diagnosis not present

## 2017-12-04 DIAGNOSIS — Z01818 Encounter for other preprocedural examination: Secondary | ICD-10-CM | POA: Diagnosis present

## 2017-12-04 DIAGNOSIS — F329 Major depressive disorder, single episode, unspecified: Secondary | ICD-10-CM | POA: Diagnosis not present

## 2017-12-04 DIAGNOSIS — Z7952 Long term (current) use of systemic steroids: Secondary | ICD-10-CM | POA: Insufficient documentation

## 2017-12-04 DIAGNOSIS — Z01812 Encounter for preprocedural laboratory examination: Secondary | ICD-10-CM | POA: Diagnosis not present

## 2017-12-04 DIAGNOSIS — Z79890 Hormone replacement therapy (postmenopausal): Secondary | ICD-10-CM | POA: Insufficient documentation

## 2017-12-04 HISTORY — DX: Gastro-esophageal reflux disease without esophagitis: K21.9

## 2017-12-04 HISTORY — DX: Cardiac murmur, unspecified: R01.1

## 2017-12-04 HISTORY — DX: Type 2 diabetes mellitus without complications: E11.9

## 2017-12-04 LAB — HEMOGLOBIN A1C
Hgb A1c MFr Bld: 7.8 % — ABNORMAL HIGH (ref 4.8–5.6)
Mean Plasma Glucose: 177.16 mg/dL

## 2017-12-04 LAB — BASIC METABOLIC PANEL
Anion gap: 10 (ref 5–15)
BUN: 9 mg/dL (ref 6–20)
CO2: 25 mmol/L (ref 22–32)
Calcium: 9.3 mg/dL (ref 8.9–10.3)
Chloride: 104 mmol/L (ref 101–111)
Creatinine, Ser: 0.46 mg/dL (ref 0.44–1.00)
GFR calc Af Amer: >60 mL/min (ref >60)
GFR calc non Af Amer: >60 mL/min (ref >60)
Glucose, Bld: 106 mg/dL — ABNORMAL HIGH (ref 65–99)
Potassium: 3.8 mmol/L (ref 3.5–5.1)
Sodium: 139 mmol/L (ref 135–145)

## 2017-12-04 LAB — SURGICAL PCR SCREEN
MRSA, PCR: NEGATIVE
Staphylococcus aureus: POSITIVE — AB

## 2017-12-04 LAB — CBC WITH DIFFERENTIAL/PLATELET
Basophils Absolute: 0 10*3/uL (ref 0.0–0.1)
Basophils Relative: 0 %
Eosinophils Absolute: 0 10*3/uL (ref 0.0–0.7)
Eosinophils Relative: 0 %
HCT: 44.3 % (ref 36.0–46.0)
Hemoglobin: 14.9 g/dL (ref 12.0–15.0)
Lymphocytes Relative: 20 %
Lymphs Abs: 2.4 10*3/uL (ref 0.7–4.0)
MCH: 31.8 pg (ref 26.0–34.0)
MCHC: 33.6 g/dL (ref 30.0–36.0)
MCV: 94.5 fL (ref 78.0–100.0)
Monocytes Absolute: 0.8 10*3/uL (ref 0.1–1.0)
Monocytes Relative: 7 %
Neutro Abs: 8.6 10*3/uL — ABNORMAL HIGH (ref 1.7–7.7)
Neutrophils Relative %: 73 %
Platelets: 241 10*3/uL (ref 150–400)
RBC: 4.69 MIL/uL (ref 3.87–5.11)
RDW: 14.6 % (ref 11.5–15.5)
WBC: 11.9 10*3/uL — ABNORMAL HIGH (ref 4.0–10.5)

## 2017-12-04 LAB — GLUCOSE, CAPILLARY: Glucose-Capillary: 146 mg/dL — ABNORMAL HIGH (ref 65–99)

## 2017-12-04 NOTE — Pre-Procedure Instructions (Signed)
Jessica [name]  12/04/2017      [facility] [address], [place] [address] Phone: [phone] Fax: [phone]    Your procedure is scheduled on December 08, 2017.  Report to [facility] Admitting at 11:20 A.M.  Call this number if you have problems the morning of surgery:  [phone]   Remember:  Do not eat food or drink liquids after midnight.   Take these medicines the morning of surgery with A SIP OF WATER :  Fioricet if needed Cyclobenzaprine (Flexeril) if needed Fluoxetine (Prozac) Gabapentin (Neurontin) Hydrocodone-acetaminophen (Norco) if needed Levothyroxine (Synthroid) Omeprazole (Prilosec) Prednisone Propranolol ER (Inderal LA)   **DO NOT TAKE Pioglitazone (ACTOS) or Metformin (GLUCOPHAGE-XR) the morning of surgery**   7 days prior to surgery STOP taking any Aspirin(unless otherwise instructed by your surgeon), Aleve, Naproxen, Ibuprofen, Motrin, Advil, Goody's, BC's, all herbal medications, fish oil, and all vitamins      How to Manage Your Diabetes Before and After Surgery  Why is it important to control my blood sugar before and after surgery? . Improving blood sugar levels before and after surgery helps healing and can limit problems. . A way of improving blood sugar control is eating a healthy diet by: o  Eating less sugar and carbohydrates o  Increasing activity/exercise o  Talking with your doctor about reaching your blood sugar goals . High blood sugars (greater than 180 mg/dL) can raise your risk of infections and slow your recovery, so you will need to focus on controlling your diabetes during the weeks before surgery. . Make sure that the doctor who takes care of your diabetes knows about your planned surgery including the date and location.  How do I manage my blood sugar before surgery? . Check your blood sugar at least 4 times a day, starting 2 days before  surgery, to make sure that the level is not too high or low. o Check your blood sugar the morning of your surgery when you wake up and every 2 hours until you get to the Short Stay unit. . If your blood sugar is less than 70 mg/dL, you will need to treat for low blood sugar: o Do not take insulin. o Treat a low blood sugar (less than 70 mg/dL) with  cup of clear juice (cranberry or apple), 4 glucose tablets, OR glucose gel. Recheck blood sugar in 15 minutes after treatment (to make sure it is greater than 70 mg/dL). If your blood sugar is not greater than 70 mg/dL on recheck, call [phone] o  for further instructions. . Report your blood sugar to the short stay nurse when you get to Short Stay.  . If you are admitted to the hospital after surgery: o Your blood sugar will be checked by the staff and you will probably be given insulin after surgery (instead of oral diabetes medicines) to make sure you have good blood sugar levels. o The goal for blood sugar control after surgery is 80-180 mg/dL.     Do not wear jewelry, make-up or nail polish.  Do not wear lotions, powders, or perfumes, or deoderant.  Do not shave 48 hours prior to surgery.    Do not bring valuables to the hospital.   [facility] is not responsible for any belongings or valuables.  Contacts, dentures or bridgework may not be worn into surgery.  Leave your suitcase in the car.  After surgery  it may be brought to your room.  For patients admitted to the hospital, discharge time will be determined by your treatment team.  Patients discharged the day of surgery will not be allowed to drive home.  Special instructions:   [facility]- Preparing For Surgery  Before surgery, you can play an important role. Because skin is not sterile, your skin needs to be as free of germs as possible. You can reduce the number of germs on your skin by washing with CHG (chlorahexidine gluconate) Soap before surgery.  CHG is an antiseptic  cleaner which kills germs and bonds with the skin to continue killing germs even after washing.  Please do not use if you have an allergy to CHG or antibacterial soaps. If your skin becomes reddened/irritated stop using the CHG.  Do not shave (including legs and underarms) for at least 48 hours prior to first CHG shower. It is OK to shave your face.  Please follow these instructions carefully.   1. Shower the NIGHT BEFORE SURGERY and the MORNING OF SURGERY with CHG.   2. If you chose to wash your hair, wash your hair first as usual with your normal shampoo.  3. After you shampoo, rinse your hair and body thoroughly to remove the shampoo.  4. Use CHG as you would any other liquid soap. You can apply CHG directly to the skin and wash gently with a scrungie or a clean washcloth.   5. Apply the CHG Soap to your body ONLY FROM THE NECK DOWN.  Do not use on open wounds or open sores. Avoid contact with your eyes, ears, mouth and genitals (private parts). Wash Face and genitals (private parts)  with your normal soap.  6. Wash thoroughly, paying special attention to the area where your surgery will be performed.  7. Thoroughly rinse your body with warm water from the neck down.  8. DO NOT shower/wash with your normal soap after using and rinsing off the CHG Soap.  9. Pat yourself dry with a CLEAN TOWEL.  10. Wear CLEAN PAJAMAS to bed the night before surgery, wear comfortable clothes the morning of surgery  11. Place CLEAN SHEETS on your bed the night of your first shower and DO NOT SLEEP WITH PETS.    Day of Surgery: Do not apply any deodorants/lotions. Please wear clean clothes to the hospital/surgery center.      Please read over the following fact sheets that you were given. Coughing and Deep Breathing and Surgical Site Infection Prevention

## 2017-12-04 NOTE — Progress Notes (Signed)
PCP: Dr. Jani Gravel  EKG: Today CXR: Denies ECHO: Denies Stress Test: Denies Cardiac Cath: Denies  Pt doesn't know typical fasting blood sugars, she reports checking CBG "every few months."    Patient denies shortness of breath, fever, cough, and chest pain at PAT appointment.  Patient verbalized understanding of instructions provided today at the PAT appointment.  Patient asked to review instructions at home and day of surgery.

## 2017-12-05 NOTE — Progress Notes (Addendum)
Anesthesia Chart Review: Patient is a 67 year old female scheduled for C4-5, C5-6, C6-7 ACDF on 12/08/17 by Dr. Earnie Larsson.  History includes smoking, COPD, asthma, RA, osteoarthritis, scoliosis, DM2, irregular heartbeat (not specified; PVCs on EKG), murmur, depression, hypothyroidism, migraines, GERD, gastric ulcers, fatty liver, hysterectomy '89, cholecystectomy '10, appendectomy, perforated colon surgery '86, corneal transplant '70's. BMI is consistent with obesity.  - PCP is Dr. Jani Gravel at [facility]. (09/03/17 office note scanned under Media tab.). - GI is Dr. Lucio Edward.  Meds include Xanax, Fioricet, Exenatide ER, Vytorin, Prozac, Neurontin, Amaryl, Robinul, Norco, levothyroxine, Glucophage XR, methotrexate, Remeron, Prilosec, Actos, prednisone, Inderal LA.  BP 132/71   Pulse 76   Temp 36.9 C   Resp 20   Ht 5\' 6"  (1.676 m)   Wt 196 lb 9.6 oz (89.2 kg)   SpO2 95%   BMI 31.73 kg/m    EKG 12/04/17: SR with occasional PVCs, non-specific ST abnormality. No prior EKG in [facility] or [facility]. She denied chest pain, SOB at PAT.   Preoperative labs noted. Cr 0.46. WBC 11.9, H/H 14.9/44.3. PLT 241. A1c 7.8. LFTS were WNL on 10/13/17.   I was not asked to evaluate patient during PAT. She did list history of murmur, but otherwise no details. She denied a prior echo, stress, and cath. By 10/13/17 notes, Dr. Fuller Plan did not auscultate a murmur on exam, and by 09/03/17 notes by Dr. Jani Gravel, there was no murmur on exam. Anesthesiologist to evaluate on the day of surgery, but based on currently available information, I would anticipate that she can proceed as planned If no acute changes.   George Hugh [facility] Short Stay Center/Anesthesiology Phone [phone] 12/05/2017 11:15 AM

## 2017-12-08 ENCOUNTER — Inpatient Hospital Stay (HOSPITAL_COMMUNITY): Payer: 59 | Admitting: Certified Registered"

## 2017-12-08 ENCOUNTER — Encounter (HOSPITAL_COMMUNITY): Payer: Self-pay

## 2017-12-08 ENCOUNTER — Inpatient Hospital Stay (HOSPITAL_COMMUNITY): Payer: 59 | Admitting: Vascular Surgery

## 2017-12-08 ENCOUNTER — Encounter (HOSPITAL_COMMUNITY)
Admission: RE | Disposition: A | Discharge status: Home / Self Care | Payer: Self-pay | Source: Ambulatory Visit | Attending: Neurosurgery

## 2017-12-08 ENCOUNTER — Inpatient Hospital Stay (HOSPITAL_COMMUNITY)
Admission: RE | Admit: 2017-12-08 | Discharge: 2017-12-09 | DRG: 473 | Disposition: A | Discharge status: Home / Self Care | Payer: 59 | Source: Ambulatory Visit | Attending: Neurosurgery | Admitting: Neurosurgery

## 2017-12-08 ENCOUNTER — Other Ambulatory Visit: Payer: Self-pay

## 2017-12-08 ENCOUNTER — Inpatient Hospital Stay (HOSPITAL_COMMUNITY): Payer: 59

## 2017-12-08 DIAGNOSIS — K219 Gastro-esophageal reflux disease without esophagitis: Secondary | ICD-10-CM | POA: Diagnosis present

## 2017-12-08 DIAGNOSIS — M5030 Other cervical disc degeneration, unspecified cervical region: Secondary | ICD-10-CM | POA: Diagnosis present

## 2017-12-08 DIAGNOSIS — E785 Hyperlipidemia, unspecified: Secondary | ICD-10-CM | POA: Diagnosis present

## 2017-12-08 DIAGNOSIS — Z7952 Long term (current) use of systemic steroids: Secondary | ICD-10-CM | POA: Diagnosis not present

## 2017-12-08 DIAGNOSIS — M4802 Spinal stenosis, cervical region: Secondary | ICD-10-CM | POA: Diagnosis present

## 2017-12-08 DIAGNOSIS — Z79890 Hormone replacement therapy (postmenopausal): Secondary | ICD-10-CM

## 2017-12-08 DIAGNOSIS — Z7984 Long term (current) use of oral hypoglycemic drugs: Secondary | ICD-10-CM | POA: Diagnosis not present

## 2017-12-08 DIAGNOSIS — M542 Cervicalgia: Secondary | ICD-10-CM | POA: Diagnosis present

## 2017-12-08 DIAGNOSIS — M4722 Other spondylosis with radiculopathy, cervical region: Principal | ICD-10-CM | POA: Diagnosis present

## 2017-12-08 DIAGNOSIS — E039 Hypothyroidism, unspecified: Secondary | ICD-10-CM | POA: Diagnosis present

## 2017-12-08 DIAGNOSIS — E119 Type 2 diabetes mellitus without complications: Secondary | ICD-10-CM | POA: Diagnosis present

## 2017-12-08 DIAGNOSIS — K760 Fatty (change of) liver, not elsewhere classified: Secondary | ICD-10-CM | POA: Diagnosis present

## 2017-12-08 DIAGNOSIS — J449 Chronic obstructive pulmonary disease, unspecified: Secondary | ICD-10-CM | POA: Diagnosis present

## 2017-12-08 DIAGNOSIS — M069 Rheumatoid arthritis, unspecified: Secondary | ICD-10-CM | POA: Diagnosis present

## 2017-12-08 DIAGNOSIS — F17210 Nicotine dependence, cigarettes, uncomplicated: Secondary | ICD-10-CM | POA: Diagnosis present

## 2017-12-08 DIAGNOSIS — Z79899 Other long term (current) drug therapy: Secondary | ICD-10-CM | POA: Diagnosis not present

## 2017-12-08 HISTORY — DX: Spinal stenosis, cervical region: M48.02

## 2017-12-08 HISTORY — PX: ANTERIOR CERVICAL DECOMP/DISCECTOMY FUSION: SHX1161

## 2017-12-08 LAB — GLUCOSE, CAPILLARY
Glucose-Capillary: 175 mg/dL — ABNORMAL HIGH (ref 65–99)
Glucose-Capillary: 192 mg/dL — ABNORMAL HIGH (ref 65–99)
Glucose-Capillary: 190 mg/dL — ABNORMAL HIGH (ref 65–99)
Glucose-Capillary: 148 mg/dL — ABNORMAL HIGH (ref 65–99)

## 2017-12-08 SURGERY — ANTERIOR CERVICAL DECOMPRESSION/DISCECTOMY FUSION 3 LEVELS
Anesthesia: General | Site: Spine Cervical

## 2017-12-08 MED ORDER — LIDOCAINE 2% (20 MG/ML) 5 ML SYRINGE
INTRAMUSCULAR | Status: DC | PRN
  Administered 2017-12-08 (×2): 60 mg via INTRAVENOUS

## 2017-12-08 MED ORDER — OXYCODONE HCL 5 MG/5ML PO SOLN: 5.0000 mg | Freq: Once | ORAL | Status: AC | PRN

## 2017-12-08 MED ORDER — HYDROCODONE-ACETAMINOPHEN 5-325 MG PO TABS: 1.0000 | ORAL_TABLET | ORAL | Status: DC | PRN

## 2017-12-08 MED ORDER — SODIUM CHLORIDE 0.9% FLUSH: 3.0000 mL | INTRAVENOUS | Status: DC | PRN

## 2017-12-08 MED ORDER — ACETAMINOPHEN 325 MG PO TABS: 325.0000 mg | ORAL_TABLET | ORAL | Status: DC | PRN

## 2017-12-08 MED ORDER — 0.9 % SODIUM CHLORIDE (POUR BTL) OPTIME
TOPICAL | Status: DC | PRN
  Administered 2017-12-08: 1000 mL

## 2017-12-08 MED ORDER — GLYCOPYRROLATE 0.2 MG/ML IV SOSY
PREFILLED_SYRINGE | INTRAVENOUS | Status: DC | PRN
  Administered 2017-12-08: .2 mg via INTRAVENOUS

## 2017-12-08 MED ORDER — CYCLOBENZAPRINE HCL 10 MG PO TABS
10.0000 mg | ORAL_TABLET | Freq: Three times a day (TID) | ORAL | Status: DC | PRN
  Administered 2017-12-08: 10 mg via ORAL
  Filled 2017-12-08: qty 1

## 2017-12-08 MED ORDER — DIPHENHYDRAMINE HCL 50 MG/ML IJ SOLN
INTRAMUSCULAR | Status: DC | PRN
  Administered 2017-12-08: 12.5 mg via INTRAVENOUS

## 2017-12-08 MED ORDER — GLYCOPYRROLATE 1 MG PO TABS
2.0000 mg | ORAL_TABLET | Freq: Two times a day (BID) | ORAL | Status: DC
  Administered 2017-12-09: 2 mg via ORAL
  Filled 2017-12-08 (×2): qty 2

## 2017-12-08 MED ORDER — ONDANSETRON HCL 4 MG/2ML IJ SOLN
INTRAMUSCULAR | Status: AC
  Filled 2017-12-08: qty 2

## 2017-12-08 MED ORDER — MIDAZOLAM HCL 2 MG/2ML IJ SOLN
INTRAMUSCULAR | Status: DC | PRN
  Administered 2017-12-08: 2 mg via INTRAVENOUS

## 2017-12-08 MED ORDER — ACETAMINOPHEN 325 MG PO TABS: 650.0000 mg | ORAL_TABLET | ORAL | Status: DC | PRN

## 2017-12-08 MED ORDER — MIDAZOLAM HCL 2 MG/2ML IJ SOLN
INTRAMUSCULAR | Status: AC
  Filled 2017-12-08: qty 2

## 2017-12-08 MED ORDER — MENTHOL 3 MG MT LOZG: 1.0000 | LOZENGE | OROMUCOSAL | Status: DC | PRN

## 2017-12-08 MED ORDER — CEFAZOLIN SODIUM-DEXTROSE 2-4 GM/100ML-% IV SOLN
2.0000 g | INTRAVENOUS | Status: AC
  Administered 2017-12-08: 2 g via INTRAVENOUS
  Filled 2017-12-08: qty 100

## 2017-12-08 MED ORDER — PHENYLEPHRINE 40 MCG/ML (10ML) SYRINGE FOR IV PUSH (FOR BLOOD PRESSURE SUPPORT)
PREFILLED_SYRINGE | INTRAVENOUS | Status: DC | PRN
  Administered 2017-12-08: 80 ug via INTRAVENOUS

## 2017-12-08 MED ORDER — FENTANYL CITRATE (PF) 100 MCG/2ML IJ SOLN
INTRAMUSCULAR | Status: DC | PRN
  Administered 2017-12-08 (×2): 50 ug via INTRAVENOUS
  Administered 2017-12-08: 100 ug via INTRAVENOUS
  Administered 2017-12-08: 50 ug via INTRAVENOUS
  Administered 2017-12-08: 100 ug via INTRAVENOUS
  Administered 2017-12-08 (×2): 50 ug via INTRAVENOUS

## 2017-12-08 MED ORDER — THROMBIN (RECOMBINANT) 5000 UNITS EX SOLR
CUTANEOUS | Status: DC | PRN
  Administered 2017-12-08: 5000 [IU] via TOPICAL

## 2017-12-08 MED ORDER — CEFAZOLIN SODIUM-DEXTROSE 1-4 GM/50ML-% IV SOLN
1.0000 g | Freq: Three times a day (TID) | INTRAVENOUS | Status: AC
  Administered 2017-12-09: 1 g via INTRAVENOUS
  Filled 2017-12-08 (×2): qty 50

## 2017-12-08 MED ORDER — FENTANYL CITRATE (PF) 100 MCG/2ML IJ SOLN
25.0000 ug | INTRAMUSCULAR | Status: DC | PRN
  Administered 2017-12-08 (×2): 50 ug via INTRAVENOUS

## 2017-12-08 MED ORDER — THROMBIN (RECOMBINANT) 20000 UNITS EX SOLR
CUTANEOUS | Status: DC | PRN
  Administered 2017-12-08: 20 mL via TOPICAL

## 2017-12-08 MED ORDER — PHENOL 1.4 % MT LIQD
1.0000 | OROMUCOSAL | Status: DC | PRN
  Administered 2017-12-08: 1 via OROMUCOSAL
  Filled 2017-12-08: qty 177

## 2017-12-08 MED ORDER — GLIMEPIRIDE 2 MG PO TABS
4.0000 mg | ORAL_TABLET | Freq: Every day | ORAL | Status: DC
  Administered 2017-12-09: 4 mg via ORAL
  Filled 2017-12-08: qty 2

## 2017-12-08 MED ORDER — LEVOTHYROXINE SODIUM 137 MCG PO TABS
137.0000 ug | ORAL_TABLET | Freq: Every day | ORAL | Status: DC
  Administered 2017-12-09: 137 ug via ORAL
  Filled 2017-12-08: qty 1

## 2017-12-08 MED ORDER — PROPOFOL 10 MG/ML IV BOLUS
INTRAVENOUS | Status: AC
  Filled 2017-12-08: qty 20

## 2017-12-08 MED ORDER — LIDOCAINE 2% (20 MG/ML) 5 ML SYRINGE
INTRAMUSCULAR | Status: AC
  Filled 2017-12-08: qty 5

## 2017-12-08 MED ORDER — ACETAMINOPHEN 650 MG RE SUPP: 650.0000 mg | RECTAL | Status: DC | PRN

## 2017-12-08 MED ORDER — MIRTAZAPINE 15 MG PO TABS
15.0000 mg | ORAL_TABLET | Freq: Every day | ORAL | Status: DC
  Administered 2017-12-08: 15 mg via ORAL
  Filled 2017-12-08: qty 1

## 2017-12-08 MED ORDER — PREDNISONE 10 MG PO TABS
10.0000 mg | ORAL_TABLET | Freq: Every day | ORAL | Status: DC
  Administered 2017-12-09: 10 mg via ORAL
  Filled 2017-12-08: qty 1

## 2017-12-08 MED ORDER — SUGAMMADEX SODIUM 200 MG/2ML IV SOLN
INTRAVENOUS | Status: AC
  Filled 2017-12-08: qty 2

## 2017-12-08 MED ORDER — ROCURONIUM BROMIDE 10 MG/ML (PF) SYRINGE
PREFILLED_SYRINGE | INTRAVENOUS | Status: DC | PRN
  Administered 2017-12-08: 20 mg via INTRAVENOUS
  Administered 2017-12-08: 50 mg via INTRAVENOUS

## 2017-12-08 MED ORDER — VITAMIN D (ERGOCALCIFEROL) 1.25 MG (50000 UNIT) PO CAPS: 50000.0000 [IU] | ORAL_CAPSULE | ORAL | Status: DC

## 2017-12-08 MED ORDER — SUGAMMADEX SODIUM 200 MG/2ML IV SOLN
INTRAVENOUS | Status: DC | PRN
  Administered 2017-12-08: 180 mg via INTRAVENOUS

## 2017-12-08 MED ORDER — PROPOFOL 1000 MG/100ML IV EMUL
INTRAVENOUS | Status: AC
  Filled 2017-12-08: qty 100

## 2017-12-08 MED ORDER — DEXAMETHASONE SODIUM PHOSPHATE 10 MG/ML IJ SOLN
10.0000 mg | INTRAMUSCULAR | Status: AC
  Administered 2017-12-08: 10 mg via INTRAVENOUS
  Filled 2017-12-08: qty 1

## 2017-12-08 MED ORDER — CHLORHEXIDINE GLUCONATE CLOTH 2 % EX PADS: 6.0000 | MEDICATED_PAD | Freq: Once | CUTANEOUS | Status: DC

## 2017-12-08 MED ORDER — FENTANYL CITRATE (PF) 250 MCG/5ML IJ SOLN
INTRAMUSCULAR | Status: AC
  Filled 2017-12-08: qty 5

## 2017-12-08 MED ORDER — OXYCODONE HCL 5 MG PO TABS
5.0000 mg | ORAL_TABLET | Freq: Once | ORAL | Status: AC | PRN
  Administered 2017-12-08: 5 mg via ORAL

## 2017-12-08 MED ORDER — BUTALBITAL-APAP-CAFFEINE 50-325-40 MG PO TABS
1.0000 | ORAL_TABLET | Freq: Three times a day (TID) | ORAL | Status: DC | PRN
  Administered 2017-12-09: 1 via ORAL
  Filled 2017-12-08: qty 1

## 2017-12-08 MED ORDER — ALPRAZOLAM 0.5 MG PO TABS
0.5000 mg | ORAL_TABLET | Freq: Every day | ORAL | Status: DC
Start: 2017-12-08 — End: 2017-12-09
  Administered 2017-12-08: 0.5 mg via ORAL
  Filled 2017-12-08: qty 1

## 2017-12-08 MED ORDER — ONDANSETRON HCL 4 MG/2ML IJ SOLN: 4.0000 mg | Freq: Four times a day (QID) | INTRAMUSCULAR | Status: DC | PRN

## 2017-12-08 MED ORDER — LACTATED RINGERS IV SOLN
INTRAVENOUS | Status: DC
Start: 2017-12-08 — End: 2017-12-08
  Administered 2017-12-08 (×3): via INTRAVENOUS

## 2017-12-08 MED ORDER — ADULT MULTIVITAMIN W/MINERALS CH: 1.0000 | ORAL_TABLET | Freq: Every day | ORAL | Status: DC

## 2017-12-08 MED ORDER — DIPHENHYDRAMINE HCL 50 MG/ML IJ SOLN
INTRAMUSCULAR | Status: AC
  Filled 2017-12-08: qty 1

## 2017-12-08 MED ORDER — PROPOFOL 500 MG/50ML IV EMUL
INTRAVENOUS | Status: DC | PRN
  Administered 2017-12-08: 25 ug/kg/min via INTRAVENOUS

## 2017-12-08 MED ORDER — OXYCODONE HCL 5 MG PO TABS
ORAL_TABLET | ORAL | Status: AC
  Administered 2017-12-08: 5 mg via ORAL
  Filled 2017-12-08: qty 1

## 2017-12-08 MED ORDER — FENTANYL CITRATE (PF) 100 MCG/2ML IJ SOLN
INTRAMUSCULAR | Status: AC
  Administered 2017-12-08: 50 ug via INTRAVENOUS
  Filled 2017-12-08: qty 2

## 2017-12-08 MED ORDER — ONDANSETRON HCL 4 MG PO TABS
4.0000 mg | ORAL_TABLET | Freq: Four times a day (QID) | ORAL | Status: DC | PRN
  Administered 2017-12-09: 4 mg via ORAL
  Filled 2017-12-08: qty 1

## 2017-12-08 MED ORDER — METFORMIN HCL ER 500 MG PO TB24
500.0000 mg | ORAL_TABLET | Freq: Every day | ORAL | Status: DC
  Administered 2017-12-08: 500 mg via ORAL
  Filled 2017-12-08: qty 1

## 2017-12-08 MED ORDER — ROCURONIUM BROMIDE 10 MG/ML (PF) SYRINGE
PREFILLED_SYRINGE | INTRAVENOUS | Status: AC
  Filled 2017-12-08: qty 5

## 2017-12-08 MED ORDER — THROMBIN (RECOMBINANT) 5000 UNITS EX SOLR
CUTANEOUS | Status: AC
  Filled 2017-12-08: qty 5000

## 2017-12-08 MED ORDER — THROMBIN (RECOMBINANT) 20000 UNITS EX SOLR
CUTANEOUS | Status: AC
  Filled 2017-12-08: qty 20000

## 2017-12-08 MED ORDER — INSULIN ASPART 100 UNIT/ML ~~LOC~~ SOLN
0.0000 [IU] | Freq: Three times a day (TID) | SUBCUTANEOUS | Status: DC
  Administered 2017-12-08: 4 [IU] via SUBCUTANEOUS
  Administered 2017-12-09: 3 [IU] via SUBCUTANEOUS
  Administered 2017-12-09: 7 [IU] via SUBCUTANEOUS

## 2017-12-08 MED ORDER — HEMOSTATIC AGENTS (NO CHARGE) OPTIME
TOPICAL | Status: DC | PRN
  Administered 2017-12-08: 1 via TOPICAL

## 2017-12-08 MED ORDER — HYDROCODONE-ACETAMINOPHEN 10-325 MG PO TABS
2.0000 | ORAL_TABLET | ORAL | Status: DC | PRN
  Administered 2017-12-08 – 2017-12-09 (×5): 2 via ORAL
  Filled 2017-12-08 (×5): qty 2

## 2017-12-08 MED ORDER — EZETIMIBE-SIMVASTATIN 10-40 MG PO TABS
1.0000 | ORAL_TABLET | Freq: Every day | ORAL | Status: DC
  Administered 2017-12-09: 1 via ORAL
  Filled 2017-12-08 (×2): qty 1

## 2017-12-08 MED ORDER — SODIUM CHLORIDE 0.9% FLUSH
3.0000 mL | Freq: Two times a day (BID) | INTRAVENOUS | Status: DC
  Administered 2017-12-08: 3 mL via INTRAVENOUS

## 2017-12-08 MED ORDER — PIOGLITAZONE HCL 15 MG PO TABS
15.0000 mg | ORAL_TABLET | Freq: Every day | ORAL | Status: DC
  Administered 2017-12-09: 15 mg via ORAL
  Filled 2017-12-08 (×2): qty 1

## 2017-12-08 MED ORDER — PANTOPRAZOLE SODIUM 40 MG PO TBEC
40.0000 mg | DELAYED_RELEASE_TABLET | Freq: Every day | ORAL | Status: DC
  Administered 2017-12-09: 40 mg via ORAL
  Filled 2017-12-08: qty 1

## 2017-12-08 MED ORDER — GABAPENTIN 300 MG PO CAPS
300.0000 mg | ORAL_CAPSULE | Freq: Three times a day (TID) | ORAL | Status: DC
  Administered 2017-12-08 – 2017-12-09 (×3): 300 mg via ORAL
  Filled 2017-12-08 (×3): qty 1

## 2017-12-08 MED ORDER — FLUOXETINE HCL 20 MG PO CAPS
60.0000 mg | ORAL_CAPSULE | Freq: Every day | ORAL | Status: DC
  Administered 2017-12-09: 60 mg via ORAL
  Filled 2017-12-08: qty 3

## 2017-12-08 MED ORDER — SODIUM CHLORIDE 0.9 % IR SOLN
Status: DC | PRN
  Administered 2017-12-08: 500 mL

## 2017-12-08 MED ORDER — PHENYLEPHRINE HCL 10 MG/ML IJ SOLN
INTRAMUSCULAR | Status: DC | PRN
  Administered 2017-12-08: 20 ug/min via INTRAVENOUS

## 2017-12-08 MED ORDER — HYDROMORPHONE HCL 1 MG/ML IJ SOLN
1.0000 mg | INTRAMUSCULAR | Status: DC | PRN
  Administered 2017-12-08: 1 mg via INTRAVENOUS
  Filled 2017-12-08: qty 1

## 2017-12-08 MED ORDER — ONDANSETRON HCL 4 MG/2ML IJ SOLN
INTRAMUSCULAR | Status: DC | PRN
  Administered 2017-12-08: 4 mg via INTRAVENOUS

## 2017-12-08 MED ORDER — ACETAMINOPHEN 160 MG/5ML PO SOLN: 325.0000 mg | ORAL | Status: DC | PRN

## 2017-12-08 MED ORDER — PROPRANOLOL HCL ER 120 MG PO CP24
120.0000 mg | ORAL_CAPSULE | Freq: Every day | ORAL | Status: DC
  Administered 2017-12-09: 120 mg via ORAL
  Filled 2017-12-08 (×2): qty 1

## 2017-12-08 MED ORDER — PROPOFOL 10 MG/ML IV BOLUS
INTRAVENOUS | Status: DC | PRN
  Administered 2017-12-08: 130 mg via INTRAVENOUS
  Administered 2017-12-08: 10 mg via INTRAVENOUS

## 2017-12-08 SURGICAL SUPPLY — 66 items
ADH SKN CLS APL DERMABOND .7 (GAUZE/BANDAGES/DRESSINGS) ×1
APL SKNCLS STERI-STRIP NONHPOA (GAUZE/BANDAGES/DRESSINGS) ×1
BAG DECANTER FOR FLEXI CONT (MISCELLANEOUS) ×2 IMPLANT
BENZOIN TINCTURE PRP APPL 2/3 (GAUZE/BANDAGES/DRESSINGS) ×2 IMPLANT
BIT DRILL 13 (BIT) ×1 IMPLANT
BUR MATCHSTICK NEURO 3.0 LAGG (BURR) ×2 IMPLANT
CAGE PEEK 6X14X11 (Cage) ×6 IMPLANT
CAGE SPNL 11X14X6XRADOPQ (Cage) IMPLANT
CANISTER SUCT 3000ML PPV (MISCELLANEOUS) ×2 IMPLANT
CARTRIDGE OIL MAESTRO DRILL (MISCELLANEOUS) ×1 IMPLANT
CLSR STERI-STRIP ANTIMIC 1/2X4 (GAUZE/BANDAGES/DRESSINGS) ×1 IMPLANT
DERMABOND ADVANCED (GAUZE/BANDAGES/DRESSINGS) ×1
DERMABOND ADVANCED .7 DNX12 (GAUZE/BANDAGES/DRESSINGS) IMPLANT
DIFFUSER DRILL AIR PNEUMATIC (MISCELLANEOUS) ×2 IMPLANT
DRAPE C-ARM 42X72 X-RAY (DRAPES) ×4 IMPLANT
DRAPE LAPAROTOMY 100X72 PEDS (DRAPES) ×2 IMPLANT
DRAPE MICROSCOPE LEICA (MISCELLANEOUS) ×2 IMPLANT
DRAPE POUCH INSTRU U-SHP 10X18 (DRAPES) ×2 IMPLANT
DURAPREP 6ML APPLICATOR 50/CS (WOUND CARE) ×2 IMPLANT
ELECT COATED BLADE 2.86 ST (ELECTRODE) ×2 IMPLANT
ELECT REM PT RETURN 9FT ADLT (ELECTROSURGICAL) ×2
ELECTRODE REM PT RTRN 9FT ADLT (ELECTROSURGICAL) ×1 IMPLANT
GAUZE SPONGE 4X4 12PLY STRL (GAUZE/BANDAGES/DRESSINGS) ×2 IMPLANT
GAUZE SPONGE 4X4 12PLY STRL LF (GAUZE/BANDAGES/DRESSINGS) ×1 IMPLANT
GAUZE SPONGE 4X4 16PLY XRAY LF (GAUZE/BANDAGES/DRESSINGS) ×1 IMPLANT
GLOVE BIOGEL PI IND STRL 6.5 (GLOVE) IMPLANT
GLOVE BIOGEL PI INDICATOR 6.5 (GLOVE) ×2
GLOVE ECLIPSE 9.0 STRL (GLOVE) ×1 IMPLANT
GLOVE EXAM NITRILE LRG STRL (GLOVE) IMPLANT
GLOVE EXAM NITRILE XL STR (GLOVE) IMPLANT
GLOVE EXAM NITRILE XS STR PU (GLOVE) IMPLANT
GLOVE SS PI 9.0 STRL (GLOVE) ×1 IMPLANT
GLOVE SURG SS PI 6.0 STRL IVOR (GLOVE) ×2 IMPLANT
GLOVE SURG SS PI 6.5 STRL IVOR (GLOVE) ×2 IMPLANT
GOWN STRL REUS W/ TWL LRG LVL3 (GOWN DISPOSABLE) IMPLANT
GOWN STRL REUS W/ TWL XL LVL3 (GOWN DISPOSABLE) IMPLANT
GOWN STRL REUS W/TWL 2XL LVL3 (GOWN DISPOSABLE) IMPLANT
GOWN STRL REUS W/TWL LRG LVL3 (GOWN DISPOSABLE) ×4
GOWN STRL REUS W/TWL XL LVL3 (GOWN DISPOSABLE) ×2
HALTER HD/CHIN CERV TRACTION D (MISCELLANEOUS) ×2 IMPLANT
HEMOSTAT POWDER SURGIFOAM 1G (HEMOSTASIS) ×1 IMPLANT
KIT BASIN OR (CUSTOM PROCEDURE TRAY) ×2 IMPLANT
KIT ROOM TURNOVER OR (KITS) ×2 IMPLANT
NDL SPNL 20GX3.5 QUINCKE YW (NEEDLE) ×1 IMPLANT
NEEDLE SPNL 20GX3.5 QUINCKE YW (NEEDLE) ×2 IMPLANT
NS IRRIG 1000ML POUR BTL (IV SOLUTION) ×2 IMPLANT
OIL CARTRIDGE MAESTRO DRILL (MISCELLANEOUS) ×2
PACK LAMINECTOMY NEURO (CUSTOM PROCEDURE TRAY) ×2 IMPLANT
PAD ARMBOARD 7.5X6 YLW CONV (MISCELLANEOUS) ×6 IMPLANT
PLATE 3 55XLCK NS SPNE CVD (Plate) IMPLANT
PLATE 3 ATLANTIS TRANS (Plate) ×2 IMPLANT
RUBBERBAND STERILE (MISCELLANEOUS) ×4 IMPLANT
SCREW ST FIX 4 ATL 3120213 (Screw) ×8 IMPLANT
SPACER SPNL 11X14X6XPEEK CVD (Cage) IMPLANT
SPCR SPNL 11X14X6XPEEK CVD (Cage) ×2 IMPLANT
SPONGE INTESTINAL PEANUT (DISPOSABLE) ×2 IMPLANT
SPONGE SURGIFOAM ABS GEL 100 (HEMOSTASIS) ×2 IMPLANT
STRIP CLOSURE SKIN 1/2X4 (GAUZE/BANDAGES/DRESSINGS) ×2 IMPLANT
SUT VIC AB 3-0 SH 8-18 (SUTURE) ×2 IMPLANT
SUT VIC AB 4-0 RB1 18 (SUTURE) ×2 IMPLANT
TAPE CLOTH 4X10 WHT NS (GAUZE/BANDAGES/DRESSINGS) ×2 IMPLANT
TAPE CLOTH SURG 4X10 WHT LF (GAUZE/BANDAGES/DRESSINGS) ×1 IMPLANT
TOWEL GREEN STERILE (TOWEL DISPOSABLE) ×2 IMPLANT
TOWEL GREEN STERILE FF (TOWEL DISPOSABLE) ×2 IMPLANT
TRAP SPECIMEN MUCOUS 40CC (MISCELLANEOUS) ×2 IMPLANT
WATER STERILE IRR 1000ML POUR (IV SOLUTION) ×2 IMPLANT

## 2017-12-08 NOTE — Op Note (Signed)
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  Asst. Surgeon: Ditty  Anesthesia: General  Indication: 67 year old female with progressive neck and bilateral upper extremity symptoms failing conservative management.  Workup demonstrates evidence of marked multilevel cervical disc degeneration with associated spondylosis and severe foraminal stenosis.  Patient presents now for 3 level anterior cervical discectomy and fusion in hopes of improving her symptoms.  Operative note: After induction of anesthesia, patient position supine with neck slightly extended and held in place holder traction.  Patient's anterior cervical region prepped and draped sterilely.  Incision made overlying C5-6.  Dissection performed on the right.  Retractor placed.  Fluoroscopy used.  Levels confirmed.  Disc spaces then incised at all 3 levels.  Discectomy performed using various instruments down to level the posterior annulus.  Microscope was then brought to the field used throughout the remainder of the discectomies.  Remaining aspects of annulus and osteophytes were removed down to level the posterior longitudinal ligament.  Posterior logical is not elevated and resected in a piecemeal fashion.  Underlying thecal sac was then identified.  Wide central decompression was then performed by undercutting the bodies of C4 and C5.  Decompression then proceeded into each neural foramen.  Wide anterior foraminotomies were performed on the course exiting C5 nerve roots bilaterally.  At this point a very thorough decompression had been achieved.  There was no evidence of injury to the  thecal sac or nerve roots.  Procedure was then repeated at C5-6 and C6-7 again without complication.  Wound was then irrigated fanlike solution.  [employer] anatomic peek cages packed with locally harvested autograft was then impacted into place at all 3 levels.  Each cage was recessed slightly from the anterior cortical margin.  Atlantis translational plate was then placed over the C4, C5, C6 and C7 levels.  This then attached under fluoroscopic guidance using 13 mm fixed angle screws to each at all 4 levels.  All screws given final tightening found to be solidly within the bone.  Locking screws engaged all 4levels.  Wound was then irrigated with antibiotic solution.  Final images reveal good position of the cages and the instrumentation at the proper level with normal alignment of spine.  Hemostasis was assured with bipolar cautery.  Wounds and closed in layers.  Steri-Strips and sterile dressing were applied.  No apparent complications.  Patient tolerated the procedure well and she returns to the recovery room postop.Marland Kitchen

## 2017-12-08 NOTE — Transfer of Care (Signed)
Immediate Anesthesia Transfer of Care Note  Patient: Jessica [name]  Procedure(s) Performed: Anterior Cervical Decompression/Discectomy Fusion - Cervical four-Cervical five - Cervical five-Cervical six - Cervical six-Cervical seven (Spine Cervical)  Patient Location: PACU  Anesthesia Type:General  Level of Consciousness: awake, oriented and patient cooperative  Airway & Oxygen Therapy: Patient Spontanous Breathing and Patient connected to nasal cannula oxygen  Post-op Assessment: Report given to RN and Patient moving all extremities X 4  Post vital signs: Reviewed and stable  Last Vitals:  Vitals:   12/08/17 0840 12/08/17 1308  BP: (!) 121/91 129/75  Pulse: 70 83  Resp: 18 17  Temp: 36.7 C 37.7 C  SpO2: 98% 92%    Last Pain:  Vitals:   12/08/17 0853  TempSrc:   PainSc: 0-No pain      Patients Stated Pain Goal: 3 (16/07/37 1062)  Complications: No apparent anesthesia complications

## 2017-12-08 NOTE — Anesthesia Postprocedure Evaluation (Signed)
Anesthesia Post Note  Patient: Avriel A Bessinger  Procedure(s) Performed: Anterior Cervical Decompression/Discectomy Fusion - Cervical four-Cervical five - Cervical five-Cervical six - Cervical six-Cervical seven (Spine Cervical)     Patient location during evaluation: PACU Anesthesia Type: General Level of consciousness: awake and alert Pain management: pain level controlled Vital Signs Assessment: post-procedure vital signs reviewed and stable Respiratory status: spontaneous breathing, nonlabored ventilation, respiratory function stable and patient connected to nasal cannula oxygen Cardiovascular status: blood pressure returned to baseline and stable Postop Assessment: no apparent nausea or vomiting Anesthetic complications: no    Last Vitals:  Vitals:   12/08/17 1407 12/08/17 1415  BP: 126/68 111/69  Pulse: 65 71  Resp: 16 11  Temp:    SpO2: 93% 93%    Last Pain:  Vitals:   12/08/17 1407  TempSrc:   PainSc: 7                  Alexavier Tsutsui

## 2017-12-08 NOTE — Anesthesia Procedure Notes (Signed)
Procedure Name: Intubation Date/Time: 12/08/2017 10:20 AM Performed by: Barrington Ellison, CRNA Pre-anesthesia Checklist: Patient identified, Emergency Drugs available, Suction available and Patient being monitored Patient Re-evaluated:Patient Re-evaluated prior to induction Oxygen Delivery Method: Circle System Utilized Preoxygenation: Pre-oxygenation with 100% oxygen Induction Type: IV induction Ventilation: Mask ventilation without difficulty Laryngoscope Size: Mac and 3 Grade View: Grade I Tube type: Oral Tube size: 7.0 mm Number of attempts: 2 Airway Equipment and Method: Stylet and Oral airway Placement Confirmation: ETT inserted through vocal cords under direct vision,  positive ETCO2 and breath sounds checked- equal and bilateral Secured at: 21 cm Tube secured with: Tape Dental Injury: Teeth and Oropharynx as per pre-operative assessment

## 2017-12-08 NOTE — H&P (Signed)
Jessica [name] is an 67 y.o. female.   Chief Complaint: Neck pain HPI: 67 year old female with chronic and progressive neck pain with radiation to both upper extremities with associated sensory loss and some weakness.  Workup demonstrates evidence of marked cervical disc degeneration with associated spondylosis and severe foraminal stenosis worse at C5-6 and C6-7 but also present at C4-5 patient presents now for 3 level anterior cervical decompression and fusion in hopes of improving her symptoms.  Past Medical History:  Diagnosis Date  . Asthma   . Cervical stenosis of spine   . COPD (chronic obstructive pulmonary disease) ([place])   . DDD (degenerative disc disease)   . Depression   . Diabetes mellitus without complication ([place])   . Fatty liver   . Gastric ulcer   . GERD (gastroesophageal reflux disease)   . Heart murmur   . Hyperglycemia   . Hyperlipidemia   . Hypothyroidism   . Irregular heartbeat   . Migraines   . Multiple gastric ulcers   . Obesity   . Osteoarthritis   . Rheumatoid arthritis ([place])   . Sciatica   . Scoliosis   . Vitamin D deficiency     Past Surgical History:  Procedure Laterality Date  . APPENDECTOMY    . CHOLECYSTECTOMY  2010  . COLONOSCOPY    . 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 Perforated Colon  1986   by Dr. Margot Chimes  . TOTAL ABDOMINAL HYSTERECTOMY  1989   Dr. Nori Riis  . UPPER GI ENDOSCOPY      Family History  Problem Relation Age of Onset  . Heart attack Father   . Heart disease Mother   . Hypertension Mother   . Breast cancer Sister   . Diabetes Brother    Social History:  reports that she has been smoking cigarettes.  She has a 40.00 pack-year smoking history. she has never used smokeless tobacco. She reports that she does not drink alcohol or use drugs.  Allergies:  Allergies  Allergen Reactions  . Remicade [Infliximab] Itching  . Adhesive [Tape] Itching, Rash and Other (See Comments)    Paper  tape okay  . Latex Rash    Medications Prior to Admission  Medication Sig Dispense Refill  . ALPRAZolam (XANAX) 0.5 MG tablet Take 0.5 mg by mouth at bedtime.    . butalbital-acetaminophen-caffeine (FIORICET, ESGIC) 50-325-40 MG per tablet Take 1 tablet by mouth 3 (three) times daily as needed for headache or migraine.     . cyclobenzaprine (FLEXERIL) 10 MG tablet Take 10 mg by mouth 2 (two) times daily as needed for muscle spasms.    . ergocalciferol (VITAMIN D2) 50000 UNITS capsule Take 50,000 Units by mouth every Wednesday.     . Exenatide ER (BYDUREON) 2 MG PEN Inject 2 mg into the skin every Wednesday.    . ezetimibe-simvastatin (VYTORIN) 10-40 MG tablet Take 1 tablet by mouth daily.    Marland Kitchen FLUoxetine (PROZAC) 20 MG capsule Take 60 mg by mouth daily.     Marland Kitchen gabapentin (NEURONTIN) 300 MG capsule Take 300 mg by mouth 3 (three) times daily.     Marland Kitchen glimepiride (AMARYL) 4 MG tablet Take 4 mg by mouth daily with breakfast.    . glycopyrrolate (ROBINUL) 2 MG tablet Take 2 mg by mouth 2 (two) times daily.    Marland Kitchen HYDROcodone-acetaminophen (NORCO/VICODIN) 5-325 MG per tablet Take 1 tablet by mouth daily.     Marland Kitchen levothyroxine (  SYNTHROID, LEVOTHROID) 137 MCG tablet Take 137 mcg by mouth daily before breakfast.    . metFORMIN (GLUCOPHAGE-XR) 500 MG 24 hr tablet Take 500 mg by mouth at bedtime.    . methotrexate (RHEUMATREX) 2.5 MG tablet Take 10 mg by mouth every Wednesday. Caution:Chemotherapy. Protect from light.     . mirtazapine (REMERON) 15 MG tablet Take 15 mg by mouth at bedtime.    . Multiple Vitamin (MULTIVITAMIN) tablet Take 1 tablet by mouth daily.    Marland Kitchen omeprazole (PRILOSEC) 20 MG capsule Take 20 mg by mouth daily.    . pioglitazone (ACTOS) 15 MG tablet Take 15 mg by mouth daily.    . predniSONE (DELTASONE) 10 MG tablet Take 10 mg by mouth daily with breakfast.    . propranolol ER (INDERAL LA) 120 MG 24 hr capsule Take 120 mg by mouth daily.      Results for orders placed or performed during  the hospital encounter of 12/08/17 (from the past 48 hour(s))  Glucose, capillary     Status: Abnormal   Collection Time: 12/08/17  8:38 AM  Result Value Ref Range   Glucose-Capillary 175 (H) 65 - 99 mg/dL   Comment 1 Notify RN    No results found.  Pertinent items noted in HPI and remainder of comprehensive ROS otherwise negative.  Blood pressure (!) 121/91, pulse 70, temperature 98 F (36.7 C), temperature source Oral, resp. rate 18, height 5\' 6"  (1.676 m), weight 88.9 kg (196 lb), SpO2 98 %.  Patient is awake and alert.  She is oriented and appropriate.  Her speech is fluent.  Her judgment and insight are intact cranial nerve function normal bilaterally.  Motor and sensory function of the extremities with some mild distal sensory loss in both upper extremities and some mild weakness of grip and intrinsic function bilaterally.  No evidence of long track signs.  Gait and posture normal.  Examination of the head ears eyes nose throat is unremarkable her chest and abdomen are benign.  Extremities are free from injury deformity. Assessment/Plan C4-5, C5-6, C6-7 spondylosis with foraminal stenosis and neck pain with radiculopathy.  Plan C4-5, C5-6, C6-7 anterior cervical discectomy with interbody fusion utilizing interbody cages, locally harvested autograft, and anterior plate instrumentation.  Risks and benefits of been explained.  Patient wishes to proceed.  Mallie Mussel A Nadene Witherspoon 12/08/2017, 9:47 AM

## 2017-12-08 NOTE — Brief Op Note (Signed)
12/08/2017  12:32 PM  PATIENT:  Jessica [name]  67 y.o. female  PRE-OPERATIVE DIAGNOSIS:  Stenosis  POST-OPERATIVE DIAGNOSIS:  Stenosis  PROCEDURE:  Procedure(s): Anterior Cervical Decompression/Discectomy Fusion - Cervical four-Cervical five - Cervical five-Cervical six - Cervical six-Cervical seven  SURGEON:  Surgeon(s) and Role:    * Earnie Larsson, MD - Primary    * Ditty, Kevan Ny, MD - Assisting  PHYSICIAN ASSISTANT:   ASSISTANTS:    ANESTHESIA:   general  EBL:  150 mL   BLOOD ADMINISTERED:none  DRAINS: none   LOCAL MEDICATIONS USED:  NONE  SPECIMEN:  No Specimen  DISPOSITION OF SPECIMEN:  N/A  COUNTS:  YES  TOURNIQUET:  * No tourniquets in log *  DICTATION: .Dragon Dictation  PLAN OF CARE: Admit to inpatient   PATIENT DISPOSITION:  PACU - hemodynamically stable.   Delay start of Pharmacological VTE agent (>24hrs) due to surgical blood loss or risk of bleeding: yes

## 2017-12-08 NOTE — Anesthesia Preprocedure Evaluation (Signed)
Anesthesia Evaluation  Patient identified by MRN, date of birth, ID band Patient awake    Reviewed: Allergy & Precautions, NPO status , Patient's Chart, lab work & pertinent test results, reviewed documented beta blocker date and time   History of Anesthesia Complications Negative for: history of anesthetic complications  Airway Mallampati: II  TM Distance: >3 FB Neck ROM: Full    Dental  (+) Teeth Intact,    Pulmonary asthma , COPD, Current Smoker,    breath sounds clear to auscultation       Cardiovascular + dysrhythmias  Rhythm:Regular     Neuro/Psych  Headaches, PSYCHIATRIC DISORDERS Depression  Neuromuscular disease    GI/Hepatic Neg liver ROS, PUD, GERD  Medicated and Controlled,  Endo/Other  diabetes, Type 2Hypothyroidism   Renal/GU      Musculoskeletal  (+) Arthritis ,   Abdominal   Peds  Hematology negative hematology ROS (+)   Anesthesia Other Findings   Reproductive/Obstetrics                             Anesthesia Physical Anesthesia Plan  ASA: II  Anesthesia Plan: General   Post-op Pain Management:    Induction: Intravenous  PONV Risk Score and Plan: 2 and Ondansetron and Dexamethasone  Airway Management Planned: Oral ETT  Additional Equipment: None  Intra-op Plan:   Post-operative Plan: Extubation in OR  Informed Consent: I have reviewed the patients History and Physical, chart, labs and discussed the procedure including the risks, benefits and alternatives for the proposed anesthesia with the patient or authorized representative who has indicated his/her understanding and acceptance.   Dental advisory given  Plan Discussed with: CRNA and Surgeon  Anesthesia Plan Comments:         Anesthesia Quick Evaluation

## 2017-12-09 ENCOUNTER — Ambulatory Visit: Payer: 59 | Admitting: Gastroenterology

## 2017-12-09 LAB — GLUCOSE, CAPILLARY
Glucose-Capillary: 130 mg/dL — ABNORMAL HIGH (ref 65–99)
Glucose-Capillary: 212 mg/dL — ABNORMAL HIGH (ref 65–99)

## 2017-12-09 MED ORDER — CYCLOBENZAPRINE HCL 10 MG PO TABS: 10.0000 mg | ORAL_TABLET | Freq: Three times a day (TID) | ORAL | 0 refills | Status: DC | PRN

## 2017-12-09 MED ORDER — HYDROCODONE-ACETAMINOPHEN 5-325 MG PO TABS: 1.0000 | ORAL_TABLET | ORAL | 0 refills | Status: DC | PRN

## 2017-12-09 NOTE — Progress Notes (Signed)
Patient alert and oriented, mae's well, voiding adequate amount of urine, swallowing without difficulty, no c/o pain at time of discharge. Patient discharged home with family. Script and discharged instructions given to patient. Patient and family stated understanding of instructions given. Patient has an appointment with Dr. Pool  

## 2017-12-09 NOTE — Discharge Summary (Signed)
Physician Discharge Summary  Patient ID: Jessica [name] MRN: [id number] DOB/AGE: [birth year] 67 y.o.  Admit date: 12/08/2017 Discharge date: 12/09/2017  Admission Diagnoses:  Discharge Diagnoses:  Active Problems:   Foraminal stenosis of cervical region   Discharged Condition: good  Hospital Course: Patient admitted to hospital where she underwent an uncomplicated 3 level anterior cervical decompression and fusion.  Postoperatively doing well.  Preoperative neck and upper extremity pain much improved.  Standing and walking without difficulty.  Swallowing well.  Ready for discharge home  Consults:   Significant Diagnostic Studies:   Treatments:   Discharge Exam: Blood pressure 118/65, pulse 77, temperature 99.3 F (37.4 C), resp. rate 18, height 5\' 6"  (1.676 m), weight 88.9 kg (196 lb), SpO2 96 %. Awake and alert.  Oriented and appropriate.  Cranial nerve function intact.  Motor and sensory function extremities normal.  Wound clean and dry.  Chest and abdomen benign.  Disposition:    Allergies as of 12/09/2017      Reactions   Remicade [infliximab] Itching   Adhesive [tape] Itching, Rash, Other (See Comments)   Paper tape okay   Latex Rash      Medication List    TAKE these medications   ALPRAZolam 0.5 MG tablet Commonly known as:  XANAX Take 0.5 mg by mouth at bedtime.   butalbital-acetaminophen-caffeine 50-325-40 MG tablet Commonly known as:  FIORICET, ESGIC Take 1 tablet by mouth 3 (three) times daily as needed for headache or migraine.   BYDUREON 2 MG Pen Generic drug:  Exenatide ER Inject 2 mg into the skin every Wednesday.   cyclobenzaprine 10 MG tablet Commonly known as:  FLEXERIL Take 1 tablet (10 mg total) by mouth 3 (three) times daily as needed for muscle spasms. What changed:  when to take this   ergocalciferol 50000 units capsule Commonly known as:  VITAMIN D2 Take 50,000 Units by mouth every Wednesday.   ezetimibe-simvastatin 10-40 MG  tablet Commonly known as:  VYTORIN Take 1 tablet by mouth daily.   FLUoxetine 20 MG capsule Commonly known as:  PROZAC Take 60 mg by mouth daily.   gabapentin 300 MG capsule Commonly known as:  NEURONTIN Take 300 mg by mouth 3 (three) times daily.   glimepiride 4 MG tablet Commonly known as:  AMARYL Take 4 mg by mouth daily with breakfast.   glycopyrrolate 2 MG tablet Commonly known as:  ROBINUL Take 2 mg by mouth 2 (two) times daily.   HYDROcodone-acetaminophen 5-325 MG tablet Commonly known as:  NORCO/VICODIN Take 1-2 tablets by mouth every 4 (four) hours as needed for moderate pain. What changed:    how much to take  when to take this  reasons to take this   levothyroxine 137 MCG tablet Commonly known as:  SYNTHROID, LEVOTHROID Take 137 mcg by mouth daily before breakfast.   metFORMIN 500 MG 24 hr tablet Commonly known as:  GLUCOPHAGE-XR Take 500 mg by mouth at bedtime.   methotrexate 2.5 MG tablet Commonly known as:  RHEUMATREX Take 10 mg by mouth every Wednesday. Caution:Chemotherapy. Protect from light.   mirtazapine 15 MG tablet Commonly known as:  REMERON Take 15 mg by mouth at bedtime.   multivitamin tablet Take 1 tablet by mouth daily.   omeprazole 20 MG capsule Commonly known as:  PRILOSEC Take 20 mg by mouth daily.   pioglitazone 15 MG tablet Commonly known as:  ACTOS Take 15 mg by mouth daily.   predniSONE 10 MG tablet Commonly known as:  DELTASONE  Take 10 mg by mouth daily with breakfast.   propranolol ER 120 MG 24 hr capsule Commonly known as:  INDERAL LA Take 120 mg by mouth daily.        Signed: Cooper Render Emmy Keng 12/09/2017, 1:23 PM

## 2017-12-09 NOTE — Discharge Instructions (Signed)
Wound Care Keep incision covered and dry. Do not put any creams, lotions, or ointments on incision. Leave steri-strips on neck.  They will fall off by themselves. Activity Walk each and every day, increasing distance each day. No lifting greater than 5 lbs.  Avoid excessive neck motion. No driving for 2 weeks; may ride as a passenger locally. Wear neck brace at all times except when showering. Diet Resume your normal diet.  Return to Work Will be discussed at you follow up appointment. Call Your Doctor If Any of These Occur Redness, drainage, or swelling at the wound.  Temperature greater than 101 degrees. Severe pain not relieved by pain medication. Incision starts to come apart. Follow Up Appt Call today for appointment in 1-2 weeks ([phone]) or for problems.  If you have any hardware placed in your spine, you will need an x-ray before your appointment.

## 2017-12-12 ENCOUNTER — Encounter (HOSPITAL_COMMUNITY): Payer: Self-pay | Admitting: Neurosurgery

## 2017-12-15 ENCOUNTER — Emergency Department (HOSPITAL_COMMUNITY)
Admission: EM | Admit: 2017-12-15 | Discharge: 2017-12-15 | Disposition: A | Discharge status: Home / Self Care | Payer: 59 | Source: Home / Self Care

## 2017-12-15 ENCOUNTER — Emergency Department (HOSPITAL_COMMUNITY): Payer: 59

## 2017-12-15 ENCOUNTER — Encounter (HOSPITAL_COMMUNITY): Payer: Self-pay | Admitting: Emergency Medicine

## 2017-12-15 ENCOUNTER — Other Ambulatory Visit: Payer: Self-pay

## 2017-12-15 ENCOUNTER — Encounter (HOSPITAL_COMMUNITY): Payer: Self-pay

## 2017-12-15 ENCOUNTER — Emergency Department (HOSPITAL_COMMUNITY)
Admission: EM | Admit: 2017-12-15 | Discharge: 2017-12-15 | Disposition: A | Discharge status: Home / Self Care | Payer: 59 | Attending: Emergency Medicine | Admitting: Emergency Medicine

## 2017-12-15 DIAGNOSIS — Z7984 Long term (current) use of oral hypoglycemic drugs: Secondary | ICD-10-CM | POA: Insufficient documentation

## 2017-12-15 DIAGNOSIS — Z5321 Procedure and treatment not carried out due to patient leaving prior to being seen by health care provider: Secondary | ICD-10-CM | POA: Insufficient documentation

## 2017-12-15 DIAGNOSIS — J45909 Unspecified asthma, uncomplicated: Secondary | ICD-10-CM | POA: Insufficient documentation

## 2017-12-15 DIAGNOSIS — J449 Chronic obstructive pulmonary disease, unspecified: Secondary | ICD-10-CM | POA: Diagnosis not present

## 2017-12-15 DIAGNOSIS — G8928 Other chronic postprocedural pain: Secondary | ICD-10-CM | POA: Diagnosis not present

## 2017-12-15 DIAGNOSIS — G8918 Other acute postprocedural pain: Secondary | ICD-10-CM

## 2017-12-15 DIAGNOSIS — M542 Cervicalgia: Secondary | ICD-10-CM | POA: Insufficient documentation

## 2017-12-15 DIAGNOSIS — E119 Type 2 diabetes mellitus without complications: Secondary | ICD-10-CM | POA: Diagnosis not present

## 2017-12-15 DIAGNOSIS — F17210 Nicotine dependence, cigarettes, uncomplicated: Secondary | ICD-10-CM | POA: Insufficient documentation

## 2017-12-15 MED ORDER — FENTANYL CITRATE (PF) 100 MCG/2ML IJ SOLN
50.0000 ug | Freq: Once | INTRAMUSCULAR | Status: DC
  Filled 2017-12-15: qty 2

## 2017-12-15 MED ORDER — DIAZEPAM 5 MG PO TABS: 5.0000 mg | ORAL_TABLET | Freq: Two times a day (BID) | ORAL | 0 refills | Status: DC

## 2017-12-15 MED ORDER — DIAZEPAM 5 MG PO TABS
5.0000 mg | ORAL_TABLET | Freq: Once | ORAL | Status: AC
  Administered 2017-12-15: 5 mg via ORAL
  Filled 2017-12-15: qty 1

## 2017-12-15 NOTE — ED Provider Notes (Signed)
[facility] DEPT Provider Note   CSN: [id number] Arrival date & time: 12/15/17  0546     History   Chief Complaint Chief Complaint  Patient presents with  . Neck Pain    HPI Jessica [name] is a 67 y.o. female who recently underwent a 3 level anterior cervical decompression and fusion for cervical disc degeneration with associated spondylolysis and severe foraminal stenosis of C4-5, C5-C6 and C6-7 on 12/08/2017 by Dr. Annette Stable of neurosurgery.  Patient states that she was discharged home on Norco, and muscle relaxers.  She has been taking these without relief of her symptoms.  She states she has been taking 1-2 Norco every 4-6 hours for her symptoms without relief.  She states that the pain was worse tonight and woke her from her sleep. She states that around 3am she awoke with pain in the back of her neck with radiation to her shoulder that she described as sharp. She has been wearing her soft cervical collar as prescribed. No fall or trauma.  She denies any weakness of the upper extremities or paresthesias. She has been without fever, N/V, drainage from incision site. No difficulty swallowing or breathing.   HPI  Past Medical History:  Diagnosis Date  . Asthma   . Cervical stenosis of spine   . COPD (chronic obstructive pulmonary disease) ([place])   . DDD (degenerative disc disease)   . Depression   . Diabetes mellitus without complication ([place])   . Fatty liver   . Gastric ulcer   . GERD (gastroesophageal reflux disease)   . Heart murmur   . Hyperglycemia   . Hyperlipidemia   . Hypothyroidism   . Irregular heartbeat   . Migraines   . Multiple gastric ulcers   . Obesity   . Osteoarthritis   . Rheumatoid arthritis ([place])   . Sciatica   . Scoliosis   . Vitamin D deficiency     Patient Active Problem List   Diagnosis Date Noted  . Foraminal stenosis of cervical region 12/08/2017    Past Surgical History:  Procedure Laterality Date  . ANTERIOR  CERVICAL DECOMP/DISCECTOMY FUSION  12/08/2017   Procedure: Anterior Cervical Decompression/Discectomy Fusion - Cervical four-Cervical five - Cervical five-Cervical six - Cervical six-Cervical seven;  Surgeon: Earnie Larsson, MD;  Location: [facility];  Service: Neurosurgery;;  . APPENDECTOMY    . CHOLECYSTECTOMY  2010  . COLONOSCOPY    . 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 Perforated Colon  1986   by Dr. Margot Chimes  . TOTAL ABDOMINAL HYSTERECTOMY  1989   Dr. Nori Riis  . UPPER GI ENDOSCOPY      OB History    No data available       Home Medications    Prior to Admission medications   Medication Sig Start Date End Date Taking? Authorizing Provider  ALPRAZolam Duanne Moron) 0.5 MG tablet Take 0.5 mg by mouth at bedtime.    [provider]  butalbital-acetaminophen-caffeine (FIORICET, ESGIC) 50-325-40 MG per tablet Take 1 tablet by mouth 3 (three) times daily as needed for headache or migraine.     [provider]  cyclobenzaprine (FLEXERIL) 10 MG tablet Take 1 tablet (10 mg total) by mouth 3 (three) times daily as needed for muscle spasms. 12/09/17   Earnie Larsson, MD  ergocalciferol (VITAMIN D2) 50000 UNITS capsule Take 50,000 Units by mouth every Wednesday.     [provider]  Exenatide ER (BYDUREON)  2 MG PEN Inject 2 mg into the skin every Wednesday.    [provider]  ezetimibe-simvastatin (VYTORIN) 10-40 MG tablet Take 1 tablet by mouth daily.    [provider]  FLUoxetine (PROZAC) 20 MG capsule Take 60 mg by mouth daily.     [provider]  gabapentin (NEURONTIN) 300 MG capsule Take 300 mg by mouth 3 (three) times daily.     [provider]  glimepiride (AMARYL) 4 MG tablet Take 4 mg by mouth daily with breakfast.    [provider]  glycopyrrolate (ROBINUL) 2 MG tablet Take 2 mg by mouth 2 (two) times daily.    [provider]  HYDROcodone-acetaminophen (NORCO/VICODIN) 5-325 MG  tablet Take 1-2 tablets by mouth every 4 (four) hours as needed for moderate pain. 12/09/17   Earnie Larsson, MD  levothyroxine (SYNTHROID, LEVOTHROID) 137 MCG tablet Take 137 mcg by mouth daily before breakfast.    [provider]  metFORMIN (GLUCOPHAGE-XR) 500 MG 24 hr tablet Take 500 mg by mouth at bedtime.    [provider]  methotrexate (RHEUMATREX) 2.5 MG tablet Take 10 mg by mouth every Wednesday. Caution:Chemotherapy. Protect from light.     [provider]  mirtazapine (REMERON) 15 MG tablet Take 15 mg by mouth at bedtime.    [provider]  Multiple Vitamin (MULTIVITAMIN) tablet Take 1 tablet by mouth daily.    [provider]  omeprazole (PRILOSEC) 20 MG capsule Take 20 mg by mouth daily.    [provider]  pioglitazone (ACTOS) 15 MG tablet Take 15 mg by mouth daily.    [provider]  predniSONE (DELTASONE) 10 MG tablet Take 10 mg by mouth daily with breakfast.    [provider]  propranolol ER (INDERAL LA) 120 MG 24 hr capsule Take 120 mg by mouth daily.    [provider]    Family History Family History  Problem Relation Age of Onset  . Heart attack Father   . Heart disease Mother   . Hypertension Mother   . Breast cancer Sister   . Diabetes Brother     Social History Social History   Tobacco Use  . Smoking status: Current Every Day Smoker    Packs/day: 1.00    Years: 40.00    Pack years: 40.00    Types: Cigarettes  . Smokeless tobacco: Never Used  Substance Use Topics  . Alcohol use: No  . Drug use: No     Allergies   Remicade [infliximab]; Adhesive [tape]; and Latex   Review of Systems Review of Systems  All other systems reviewed and are negative.    Physical Exam Updated Vital Signs BP 134/72   Pulse 89   Temp 98.1 F (36.7 C) (Oral)   Resp 16   SpO2 95%   Physical Exam  Constitutional: She appears well-developed and well-nourished.  HENT:  Head:  Normocephalic and atraumatic.  Right Ear: External ear normal.  Left Ear: External ear normal.  Eyes: Conjunctivae are normal. Right eye exhibits no discharge. Left eye exhibits no discharge. No scleral icterus.  Neck:  Patient soft cervical collar.  After removal there is a 6 cm healing incision above the right clavicle from recent surgery.  There is noted ecchymosis around this area without any redness, heat, induration, fluctuance or drainage.  Patient does have tenderness diffusely throughout the entire cervical spine  Pulmonary/Chest: Effort normal. No respiratory distress.  Musculoskeletal:       Right  shoulder: Normal.       Left shoulder: Normal.  Patient with full range of motion of the bilateral shoulders.  Bilateral shoulder and grip strength 5/5.  Sensation intact to light touch for axillary, radial, median and ulnar nerve distrubution's. No wrist drop.   Neurological: She is alert.  Skin: No pallor.  Psychiatric: She has a normal mood and affect.  Nursing note and vitals reviewed.    ED Treatments / Results  Labs (all labs ordered are listed, but only abnormal results are displayed) Labs Reviewed - No data to display  EKG  EKG Interpretation None       Radiology No results found.  Procedures Procedures (including critical care time)  Medications Ordered in ED Medications  diazepam (VALIUM) tablet 5 mg (5 mg Oral Given 12/15/17 0731)     Initial Impression / Assessment and Plan / ED Course  I have reviewed the triage vital signs and the nursing notes.  Pertinent labs & imaging results that were available during my care of the patient were reviewed by me and considered in my medical decision making (see chart for details).     67 y.o. female who recently underwent a 3 level anterior cervical decompression and fusion for cervical disc degeneration with associated spondylolysis and severe foraminal stenosis of C4-5, C5-C6 and C6-7 on 12/08/2017 by Dr. Annette Stable of  neurosurgery resenting with neck pain that radiates into her bilateral trapezius.  There is no paresthesias or decreased range of motion's.  She is without fever or signs of infection around incision site.  She denies difficulty swallowing or breathing.  Exam without swelling, redness, discharge, fluctuance, induration or concern for infection or abscess development at incision site.  Patient is in control of her secretions and able to swallow without difficulty.  She has normal phonation.  Patient with diffuse neck pain on palpation. She is NVI.  X-ray reassuring.  She was given Valium in the department with relief of her symptoms.  Will discharge with short course of this and close follow-up with neurosurgeon.  She agreed to call as soon as she leaves the department today.  She was given strict return precautions and appears safe for discharge.  Final Clinical Impressions(s) / ED Diagnoses   Final diagnoses:  Post-operative pain    ED Discharge Orders    None       Lorelle Gibbs 12/15/17 7412    Virgel Manifold, MD 12/15/17 1630

## 2017-12-15 NOTE — ED Notes (Signed)
Patient transported to X-ray 

## 2017-12-15 NOTE — Discharge Instructions (Signed)
Your exam and x-ray were reassuring.  It is important to follow-up with your neurosurgeon.  Please call his office as soon as you leave the department today.  I am prescribing you short course of Valium to take as needed for additional symptoms.  If you have any difficulty swallowing, redness or drainage around the incision site please return.  If any numbness/tingling or weakness of the upper extremities you can also return to the emergency room.

## 2017-12-15 NOTE — ED Triage Notes (Signed)
Pt brought in by EMS from home with c/o neck pain  Pt had cervical fusion on Monday and has been having pain ever since  Pt has not called the dr  Pt was prescribed oxycodone 5mg  1-2 every 4 hrs as needed   Script was filled on 1/9 with 60 tabs and has 9 left in bottle

## 2017-12-15 NOTE — ED Triage Notes (Signed)
Pt had neck surgery last week and cannot get her pain under control, pt is drowsy and can't keep her eyes open, family states she last took pain meds about 5pm

## 2018-02-18 ENCOUNTER — Encounter: Payer: Self-pay | Admitting: Gastroenterology

## 2018-02-18 ENCOUNTER — Ambulatory Visit (INDEPENDENT_AMBULATORY_CARE_PROVIDER_SITE_OTHER): Payer: 59 | Admitting: Gastroenterology

## 2018-02-18 ENCOUNTER — Other Ambulatory Visit (INDEPENDENT_AMBULATORY_CARE_PROVIDER_SITE_OTHER): Payer: 59

## 2018-02-18 VITALS — BP 116/82 | HR 86 | Ht 66.0 in | Wt 191.1 lb

## 2018-02-18 DIAGNOSIS — R1084 Generalized abdominal pain: Secondary | ICD-10-CM

## 2018-02-18 DIAGNOSIS — K219 Gastro-esophageal reflux disease without esophagitis: Secondary | ICD-10-CM

## 2018-02-18 DIAGNOSIS — Z86010 Personal history of colonic polyps: Secondary | ICD-10-CM

## 2018-02-18 DIAGNOSIS — R197 Diarrhea, unspecified: Secondary | ICD-10-CM | POA: Diagnosis not present

## 2018-02-18 MED ORDER — NA SULFATE-K SULFATE-MG SULF 17.5-3.13-1.6 GM/177ML PO SOLN: 1.0000 | Freq: Once | ORAL | 0 refills | Status: AC

## 2018-02-18 MED ORDER — OMEPRAZOLE 20 MG PO CPDR: 20.0000 mg | DELAYED_RELEASE_CAPSULE | Freq: Two times a day (BID) | ORAL | 11 refills | Status: DC

## 2018-02-18 NOTE — Patient Instructions (Addendum)
Your physician has requested that you go to the basement for lab work before leaving today.  We have sent the following medications to your pharmacy for you to pick up at your convenience: omeprazole 20 mg one tablet by mouth twice daily.   You have been scheduled for a CT scan of the abdomen and pelvis at [facility] ([address]---this is in the same building as [facility]).   You are scheduled on 02/20/18 at 3:00pm. You should arrive 15 minutes prior to your appointment time for registration. Please follow the written instructions below on the day of your exam:  WARNING: IF YOU ARE ALLERGIC TO IODINE/X-RAY DYE, PLEASE NOTIFY RADIOLOGY IMMEDIATELY AT [phone]! YOU WILL BE GIVEN A 13 HOUR PREMEDICATION PREP.  1) Do not eat or drink anything after 11:00am (4 hours prior to your test) 2) You have been given 2 bottles of oral contrast to drink. The solution may taste               better if refrigerated, but do NOT add ice or any other liquid to this solution. Shake             well before drinking.    Drink 1 bottle of contrast @ 1:00pm (2 hours prior to your exam)  Drink 1 bottle of contrast @ 2:00pm (1 hour prior to your exam)  You may take any medications as prescribed with a small amount of water except for the following: Metformin, Glucophage, Glucovance, Avandamet, Riomet, Fortamet, Actoplus Met, Janumet, Glumetza or Metaglip. The above medications must be held the day of the exam AND 48 hours after the exam.  The purpose of you drinking the oral contrast is to aid in the visualization of your intestinal tract. The contrast solution may cause some diarrhea. Before your exam is started, you will be given a small amount of fluid to drink. Depending on your individual set of symptoms, you may also receive an intravenous injection of x-ray contrast/dye. Plan on being at [facility] for 30 minutes or longer, depending on the type of exam you are having  performed.  This test typically takes 30-45 minutes to complete.  If you have any questions regarding your exam or if you need to reschedule, you may call the CT department at [phone] between the hours of 8:00 am and 5:00 pm, Monday-Friday.  ________________________________________________________________________  Jessica [name] have been scheduled for an endoscopy and colonoscopy. Please follow the written instructions given to you at your visit today. Please pick up your prep supplies at the pharmacy within the next 1-3 days. If you use inhalers (even only as needed), please bring them with you on the day of your procedure. Your physician has requested that you go to www.startemmi.com and enter the access code given to you at your visit today. This web site gives a general overview about your procedure. However, you should still follow specific instructions given to you by our office regarding your preparation for the procedure.  Normal BMI (Body Mass Index- based on height and weight) is between 19 and 25. Your BMI today is Body mass index is 30.85 kg/m. Marland Kitchen Please consider follow up  regarding your BMI with your Primary Care Provider.  Thank you for choosing me and [facility].  Pricilla Riffle. Dagoberto Ligas., MD., Marval Regal

## 2018-02-18 NOTE — Progress Notes (Signed)
    History of Present Illness: This is a 67 year old with frequent diarrhea generalized abdominal pain.  She states her symptoms slightly improved with dicyclomine and Imodium but all symptoms persist.  She states she has been very anxious since discontinuing cigarette smoking in January.  She is very concerned she may have a significant underlying gastrointestinal disease.  Reflux symptoms are not well controlled on current regimen with frequent breakthrough symptoms.  CBC, CMP, tTG, IgA, GI pathogen panel in 10/2017 were all negative.   Current Medications, Allergies, Past Medical History, Past Surgical History, Family History and Social History were reviewed in [employer] record.  Physical Exam: General: Well developed, well nourished, no acute distress Head: Normocephalic and atraumatic Eyes:  sclerae anicteric, EOMI Ears: Normal auditory acuity Mouth: No deformity or lesions Lungs: Clear throughout to auscultation Heart: Regular rate and rhythm; no murmurs, rubs or bruits Abdomen: Soft, non tender and non distended. No masses, hepatosplenomegaly or hernias noted. Normal Bowel sounds Rectal: deferred to colonoscopy Musculoskeletal: Symmetrical with no gross deformities  Pulses:  Normal pulses noted Extremities: No clubbing, cyanosis, edema or deformities noted Neurological: Alert oriented x 4, grossly nonfocal Psychological:  Alert and cooperative. Anxious.   Assessment and Recommendations:  1.  Generalized abdominal pain, frequent diarrhea, GERD.  Continue glycopyrrolate 2 mg twice daily.  Continue Imodium 3 times daily as needed.  Closely follow antireflux measures.  Increase omeprazole to 20 mg twice daily.  Trial of a low FODMAP diet for 1 week.  Schedule abdominal pelvic CT, EGD and colonoscopy. The risks (including bleeding, perforation, infection, missed lesions, medication reactions and possible hospitalization or surgery if complications occur), benefits,  and alternatives to endoscopy with possible biopsy and possible dilation were discussed with the patient and they consent to proceed. The risks (including bleeding, perforation, infection, missed lesions, medication reactions and possible hospitalization or surgery if complications occur), benefits, and alternatives to endoscopy with possible biopsy and possible dilation were discussed with the patient and they consent to proceed.   2.  Personal history of adenomatous colon polyps. Colonoscopy as above.

## 2018-02-19 LAB — BASIC METABOLIC PANEL
BUN: 9 mg/dL (ref 6–23)
CO2: 19 mEq/L (ref 19–32)
Calcium: 9.5 mg/dL (ref 8.4–10.5)
Chloride: 101 mEq/L (ref 96–112)
Creatinine, Ser: 0.52 mg/dL (ref 0.40–1.20)
GFR: 125.93 mL/min (ref >60.00)
Glucose, Bld: 271 mg/dL — ABNORMAL HIGH (ref 70–99)
Potassium: 4.5 mEq/L (ref 3.5–5.1)
Sodium: 140 mEq/L (ref 135–145)

## 2018-02-20 ENCOUNTER — Ambulatory Visit (INDEPENDENT_AMBULATORY_CARE_PROVIDER_SITE_OTHER)
Admission: RE | Admit: 2018-02-20 | Discharge: 2018-02-20 | Disposition: A | Discharge status: Home / Self Care | Payer: 59 | Source: Ambulatory Visit | Attending: Gastroenterology | Admitting: Gastroenterology

## 2018-02-20 DIAGNOSIS — R197 Diarrhea, unspecified: Secondary | ICD-10-CM

## 2018-02-20 DIAGNOSIS — K219 Gastro-esophageal reflux disease without esophagitis: Secondary | ICD-10-CM

## 2018-02-20 DIAGNOSIS — R1084 Generalized abdominal pain: Secondary | ICD-10-CM | POA: Diagnosis not present

## 2018-02-20 MED ORDER — IOPAMIDOL (ISOVUE-300) INJECTION 61%
100.0000 mL | Freq: Once | INTRAVENOUS | Status: AC | PRN
  Administered 2018-02-20: 100 mL via INTRAVENOUS

## 2018-03-04 ENCOUNTER — Encounter: Payer: Self-pay | Admitting: Gastroenterology

## 2018-03-12 ENCOUNTER — Ambulatory Visit (AMBULATORY_SURGERY_CENTER): Payer: 59 | Admitting: Gastroenterology

## 2018-03-12 ENCOUNTER — Encounter: Payer: Self-pay | Admitting: Gastroenterology

## 2018-03-12 ENCOUNTER — Other Ambulatory Visit: Payer: Self-pay

## 2018-03-12 VITALS — BP 140/78 | HR 73 | Temp 97.3°F | Resp 15 | Ht 66.0 in | Wt 191.0 lb

## 2018-03-12 DIAGNOSIS — R1084 Generalized abdominal pain: Secondary | ICD-10-CM | POA: Diagnosis not present

## 2018-03-12 DIAGNOSIS — K219 Gastro-esophageal reflux disease without esophagitis: Secondary | ICD-10-CM | POA: Diagnosis not present

## 2018-03-12 DIAGNOSIS — K635 Polyp of colon: Secondary | ICD-10-CM | POA: Diagnosis not present

## 2018-03-12 DIAGNOSIS — D126 Benign neoplasm of colon, unspecified: Secondary | ICD-10-CM

## 2018-03-12 DIAGNOSIS — D123 Benign neoplasm of transverse colon: Secondary | ICD-10-CM

## 2018-03-12 DIAGNOSIS — Z86010 Personal history of colonic polyps: Secondary | ICD-10-CM

## 2018-03-12 DIAGNOSIS — D124 Benign neoplasm of descending colon: Secondary | ICD-10-CM | POA: Diagnosis not present

## 2018-03-12 DIAGNOSIS — R197 Diarrhea, unspecified: Secondary | ICD-10-CM

## 2018-03-12 MED ORDER — SODIUM CHLORIDE 0.9 % IV SOLN: 500.0000 mL | Freq: Once | INTRAVENOUS | Status: DC

## 2018-03-12 NOTE — Op Note (Addendum)
[place] Patient Name: Jessica [name] Procedure Date: 03/12/2018 3:00 PM MRN: [id number] Endoscopist: Ladene Artist , MD Age: 67 Referring MD:  Date of Birth: March 13, 1951 Gender: Female Account #: [id number] Procedure:                Colonoscopy Indications:              Clinically significant diarrhea of unexplained                            origin, Personal history of adenomatous colon                            polyps. Medicines:                Monitored Anesthesia Care Procedure:                Pre-Anesthesia Assessment:                           - Prior to the procedure, a History and Physical                            was performed, and patient medications and                            allergies were reviewed. The patient's tolerance of                            previous anesthesia was also reviewed. The risks                            and benefits of the procedure and the sedation                            options and risks were discussed with the patient.                            All questions were answered, and informed consent                            was obtained. Prior Anticoagulants: The patient has                            taken no previous anticoagulant or antiplatelet                            agents. ASA Grade Assessment: II - A patient with                            mild systemic disease. After reviewing the risks                            and benefits, the patient was deemed in  satisfactory condition to undergo the procedure.                           After obtaining informed consent, the colonoscope                            was passed under direct vision. Throughout the                            procedure, the patient's blood pressure, pulse, and                            oxygen saturations were monitored continuously. The                            Model PCF-H190DL [id number]) scope was introduced                   through the anus and advanced to the the terminal                            ileum, with identification of the appendiceal                            orifice and IC valve. The terminal ileum, ileocecal                            valve, appendiceal orifice, and rectum were                            photographed. The quality of the bowel preparation                            was excellent. The colonoscopy was performed                            without difficulty. The patient tolerated the                            procedure well. Scope In: 3:06:42 PM Scope Out: 3:30:27 PM Scope Withdrawal Time: 0 hours 20 minutes 2 seconds  Total Procedure Duration: 0 hours 23 minutes 45 seconds  Findings:                 The perianal and digital rectal examinations were                            normal.                           The terminal ileum contained a single small                            diverticulum.                           The remainder of the  exam in the terminal ileum was                            normal.                           There was evidence of a prior end-to-end                            colo-colonic anastomosis in the transverse colon.                            This was patent and was characterized by healthy                            appearing mucosa. The anastomosis was traversed.                           Four sessile polyps were found in the descending                            colon (2) and transverse colon (2). The polyps were                            6 to 7 mm in size. These polyps were removed with a                            cold snare. Resection and retrieval were complete.                           A 4 mm polyp was found in the descending colon. The                            polyp was sessile. The polyp was removed with a                            cold biopsy forceps. Resection and retrieval were                            complete.                            Multiple medium-mouthed diverticula were found in                            the left colon. There was no evidence of                            diverticular bleeding.                           A localized area of moderately erythematous mucosa  was found in the distal rectum. Biopsies were taken                            with a cold forceps for histology.                           The exam was otherwise without abnormality on                            direct and retroflexion views. Random biopsies                            obtained throughout the colon. Complications:            No immediate complications. Estimated blood loss:                            None. Estimated Blood Loss:     Estimated blood loss: none. Impression:               - Ileal diverticulum.                           - Patent end-to-end colo-colonic anastomosis,                            characterized by healthy appearing mucosa.                           - Four 6 to 7 mm polyps in the descending colon and                            in the transverse colon, removed with a cold snare.                            Resected and retrieved.                           - One 4 mm polyp in the descending colon, removed                            with a cold biopsy forceps. Resected and retrieved.                           - Moderate diverticulosis in the left colon. There                            was no evidence of diverticular bleeding.                           - Erythematous mucosa in the distal rectum.                            Biopsied.                           -  The examination was otherwise normal on direct                            and retroflexion views. Random biopsies obtained. Recommendation:           - Repeat colonoscopy in 3 - 5 years for                            surveillance pending pathology review.                           - Patient has a contact  number available for                            emergencies. The signs and symptoms of potential                            delayed complications were discussed with the                            patient. Return to normal activities tomorrow.                            Written discharge instructions were provided to the                            patient.                           - Resume previous diet.                           - Continue present medications.                           - Await pathology results.                           - Hold metformin for 3-4 days under supervision of                            Dr. Maudie Mercury and assess impact on diarrhea Ladene Artist, MD 03/12/2018 3:45:40 PM This report has been signed electronically.

## 2018-03-12 NOTE — Patient Instructions (Addendum)
YOU HAD AN ENDOSCOPIC PROCEDURE TODAY AT [facility]:   Refer to the procedure report that was given to you for any specific questions about what was found during the examination.  If the procedure report does not answer your questions, please call your gastroenterologist to clarify.  If you requested that your care partner not be given the details of your procedure findings, then the procedure report has been included in a sealed envelope for you to review at your convenience later.  YOU SHOULD EXPECT: Some feelings of bloating in the abdomen. Passage of more gas than usual.  Walking can help get rid of the air that was put into your GI tract during the procedure and reduce the bloating. If you had a lower endoscopy (such as a colonoscopy or flexible sigmoidoscopy) you may notice spotting of blood in your stool or on the toilet paper. If you underwent a bowel prep for your procedure, you may not have a normal bowel movement for a few days.  Please Note:  You might notice some irritation and congestion in your nose or some drainage.  This is from the oxygen used during your procedure.  There is no need for concern and it should clear up in a day or so.  SYMPTOMS TO REPORT IMMEDIATELY:   Following lower endoscopy (colonoscopy or flexible sigmoidoscopy):  Excessive amounts of blood in the stool  Significant tenderness or worsening of abdominal pains  Swelling of the abdomen that is new, acute  Fever of 100F or higher   Following upper endoscopy (EGD)  Vomiting of blood or coffee ground material  New chest pain or pain under the shoulder blades  Painful or persistently difficult swallowing  New shortness of breath  Fever of 100F or higher  Black, tarry-looking stools  For urgent or emergent issues, a gastroenterologist can be reached at any hour by calling [phone].   DIET:  We do recommend a small meal at first, but then you may proceed to your regular diet.  Drink  plenty of fluids but you should avoid alcoholic beverages for 24 hours.  MEDICATIONS: Continue present medications. Hold Metformin for 3-4 days under supervision of Dr. Maudie Mercury and assess impact on diarrhea.  Please see handouts given to you by your recovery nurse.  Follow antireflux precautions (see handout given to you by your recovery nurse).  ACTIVITY:  You should plan to take it easy for the rest of today and you should NOT DRIVE or use heavy machinery until tomorrow (because of the sedation medicines used during the test).    FOLLOW UP: Our staff will call the number listed on your records the next business day following your procedure to check on you and address any questions or concerns that you may have regarding the information given to you following your procedure. If we do not reach you, we will leave a message.  However, if you are feeling well and you are not experiencing any problems, there is no need to return our call.  We will assume that you have returned to your regular daily activities without incident.  If any biopsies were taken you will be contacted by phone or by letter within the next 1-3 weeks.  Please call us at [phone] if you have not heard about the biopsies in 3 weeks.   Thank you for allowing [place] to provide for your healthcare needs today.   SIGNATURES/CONFIDENTIALITY: You and/or your care partner have signed paperwork which will be  entered into your electronic medical record.  These signatures attest to the fact that that the information above on your After Visit Summary has been reviewed and is understood.  Full responsibility of the confidentiality of this discharge information lies with you and/or your care-partner. 

## 2018-03-12 NOTE — Progress Notes (Signed)
Report to PACU, RN, vss, BBS= Clear.  

## 2018-03-12 NOTE — Progress Notes (Signed)
Pt had cervical spine surg 12-08-17.  Metal in her neck. maw

## 2018-03-12 NOTE — Op Note (Signed)
[place] Patient Name: Jessica [name] Procedure Date: 03/12/2018 3:00 PM MRN: [id number] Endoscopist: Ladene Artist , MD Age: 67 Referring MD:  Date of Birth: 1951/05/09 Gender: Female Account #: [id number] Procedure:                Upper GI endoscopy Indications:              Generalized abdominal pain, Gastroesophageal reflux                            disease Medicines:                Monitored Anesthesia Care Procedure:                Pre-Anesthesia Assessment:                           - Prior to the procedure, a History and Physical                            was performed, and patient medications and                            allergies were reviewed. The patient's tolerance of                            previous anesthesia was also reviewed. The risks                            and benefits of the procedure and the sedation                            options and risks were discussed with the patient.                            All questions were answered, and informed consent                            was obtained. Prior Anticoagulants: The patient has                            taken no previous anticoagulant or antiplatelet                            agents. ASA Grade Assessment: II - A patient with                            mild systemic disease. After reviewing the risks                            and benefits, the patient was deemed in                            satisfactory condition to undergo the procedure.  After obtaining informed consent, the endoscope was                            passed under direct vision. Throughout the                            procedure, the patient's blood pressure, pulse, and                            oxygen saturations were monitored continuously. The                            Endoscope was introduced through the mouth, and                            advanced to the second part of duodenum. The upper                             GI endoscopy was accomplished without difficulty.                            The patient tolerated the procedure well. Scope In: Scope Out: Findings:                 The examined esophagus was normal.                           Patchy mildly erythematous mucosa without bleeding                            was found in the gastric body and in the gastric                            antrum. Biopsies were taken with a cold forceps for                            histology.                           The exam of the stomach was otherwise normal.                           The duodenal bulb and second portion of the                            duodenum were normal. Complications:            No immediate complications. Estimated Blood Loss:     Estimated blood loss was minimal. Impression:               - Normal esophagus.                           - Erythematous mucosa in the gastric body and  antrum. Biopsied.                           - Normal duodenal bulb and second portion of the                            duodenum. Recommendation:           - Patient has a contact number available for                            emergencies. The signs and symptoms of potential                            delayed complications were discussed with the                            patient. Return to normal activities tomorrow.                            Written discharge instructions were provided to the                            patient.                           - Resume previous diet.                           - Follow antireflux measures.                           - Continue present medications.                           - Await pathology results. Ladene Artist, MD 03/12/2018 3:50:00 PM This report has been signed electronically.

## 2018-03-13 ENCOUNTER — Telehealth: Payer: Self-pay

## 2018-03-13 ENCOUNTER — Telehealth: Payer: Self-pay | Admitting: *Deleted

## 2018-03-13 NOTE — Telephone Encounter (Signed)
No answer. Name identifier. Message left to call if questions or concerns. 

## 2018-03-13 NOTE — Telephone Encounter (Signed)
Attempted to reach pt. With follow-up call following endoscopic procedure 03/12/2018.  LM on pt. Voice mail.   Will try to reach pt. Again later today.

## 2018-03-20 ENCOUNTER — Encounter: Payer: Self-pay | Admitting: Gastroenterology

## 2018-05-19 ENCOUNTER — Telehealth: Payer: Self-pay | Admitting: Gastroenterology

## 2018-05-19 NOTE — Telephone Encounter (Signed)
Patient with chronic diarrhea.  She feels she is dehydrated.  Her primary care gave her flagyl but it is not helping.  She will come in and see Ellouise Newer, PA on 05/22/18 2:30

## 2018-05-19 NOTE — Telephone Encounter (Signed)
Pt states that she has been having diarrhea for several weeks and does not know what to do. She had endo colon this past April and everything was normal. She wants to know if there is anything else that she can be tested for.

## 2018-05-22 ENCOUNTER — Ambulatory Visit (INDEPENDENT_AMBULATORY_CARE_PROVIDER_SITE_OTHER): Payer: 59 | Admitting: Physician Assistant

## 2018-05-22 ENCOUNTER — Encounter: Payer: Self-pay | Admitting: Physician Assistant

## 2018-05-22 VITALS — BP 128/72 | HR 78 | Ht 66.0 in | Wt 190.1 lb

## 2018-05-22 DIAGNOSIS — K529 Noninfective gastroenteritis and colitis, unspecified: Secondary | ICD-10-CM

## 2018-05-22 MED ORDER — CHOLESTYRAMINE 4 G PO PACK: 4.0000 g | PACK | Freq: Two times a day (BID) | ORAL | 3 refills | Status: DC

## 2018-05-22 NOTE — Progress Notes (Signed)
Chief Complaint: Chronic diarrhea  HPI:    [name] is a 67 year old female with a past medical history as listed below, who follows with Dr. Fuller Plan and presents to clinic today for follow-up of her chronic diarrhea.     02/18/2018 office visit for frequent diarrhea and generalized abdominal pain.  It improved slightly on dicyclomine and Imodium.  Had work-up including CBC, CMP, celiac studies and GI pathogen panel which were all negative.  At that time she is continued on glycopyrrolate 2 mg twice daily and Imodium 3 times daily.  Also scheduled for a CT of the abdomen and pelvis as well as EGD and colonoscopy.    02/20/2018 CT abdomen pelvis with no acute abdominal pelvic findings, mass or lesions or lymphadenopathy, diffuse fatty infiltration of the liver mild hepatomegaly, status post cholecystectomy with no biliary dilatation and age advanced atherosclerotic calcifications involving the aorta and iliac arteries.    03/12/2018 colonoscopy showing ileal diverticulum, patent end-to-end colocolonic anastomosis, 4 6-7 mm polyps in the descending and transverse colon, one 4 mm polyp in the descending colon, moderate diverticulosis in the left colon, erythematous mucosal and distal rectum and otherwise normal.  Repeat was recommended in 3 years.  Pathology showed tubular adenoma as well as ischemic changes in the rectum.  It was recommend the patient trial stopping her metformin.    03/12/18 EGD with erythematous mucosa in the gastric body and antrum.    Today, explains that she will have episodes of diarrhea which can last for weeks, typically she eats and about 30 minutes later will hear a lot of gurgling and it will all come out, like [address].  Patient claims she can hear her stools working all the way down through her until they come out.  Also with some incontinence.  Can be in the bathroom for up to 30 minutes after eating having multiple loose stools.  About 2 weeks ago patient did have to 3  days of formed stools, but prior to this also had diarrhea.  Patient does not believe that Glycopyrrolate is working at all.  She just recently was started on Cholestyramine 1 packet a day earlier this week by her PCP but tells me she does not like the orange taste.    Denies fever, chills, blood in her stool, melena, weight loss, anorexia, nausea, vomiting, heartburn or reflux.  Past Medical History:  Diagnosis Date  . Allergy   . Anxiety   . Asthma   . Cervical stenosis of spine   . COPD (chronic obstructive pulmonary disease) ([place])   . DDD (degenerative disc disease)   . Depression   . Diabetes mellitus without complication ([place])   . Fatty liver   . Gastric ulcer   . GERD (gastroesophageal reflux disease)   . Heart murmur   . Hyperglycemia   . Hyperlipidemia   . Hypothyroidism   . Irregular heartbeat   . Migraines   . Multiple gastric ulcers   . Obesity   . Osteoarthritis   . Rheumatoid arthritis ([place])   . Sciatica   . Scoliosis   . Vitamin D deficiency     Past Surgical History:  Procedure Laterality Date  . ANTERIOR CERVICAL DECOMP/DISCECTOMY FUSION  12/08/2017   Procedure: Anterior Cervical Decompression/Discectomy Fusion - Cervical four-Cervical five - Cervical five-Cervical six - Cervical six-Cervical seven;  Surgeon: Earnie Larsson, MD;  Location: [facility];  Service: Neurosurgery;;  . APPENDECTOMY    . CHOLECYSTECTOMY  2010  . COLONOSCOPY    .  COLOSTOMY     removed 3 inches of colon 1987  . 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 Perforated Colon  1986   by Dr. Margot Chimes  . POLYPECTOMY    . TOTAL ABDOMINAL HYSTERECTOMY  1989   Dr. Nori Riis  . UPPER GASTROINTESTINAL ENDOSCOPY    . UPPER GI ENDOSCOPY      Current Outpatient Medications  Medication Sig Dispense Refill  . ALPRAZolam (XANAX) 0.5 MG tablet Take 0.5 mg by mouth at bedtime.    . butalbital-acetaminophen-caffeine (FIORICET, ESGIC) 50-325-40 MG per tablet Take 1 tablet by  mouth 3 (three) times daily as needed for headache or migraine.     . cyclobenzaprine (FLEXERIL) 10 MG tablet Take 1 tablet (10 mg total) by mouth 3 (three) times daily as needed for muscle spasms. 30 tablet 0  . ergocalciferol (VITAMIN D2) 50000 UNITS capsule Take 50,000 Units by mouth every Wednesday.     . ezetimibe-simvastatin (VYTORIN) 10-40 MG tablet Take 1 tablet by mouth daily.    Marland Kitchen FLUoxetine (PROZAC) 20 MG capsule Take 60 mg by mouth daily.     Marland Kitchen gabapentin (NEURONTIN) 300 MG capsule Take 300 mg by mouth 3 (three) times daily.     Marland Kitchen glimepiride (AMARYL) 4 MG tablet Take 4 mg by mouth daily with breakfast.    . glycopyrrolate (ROBINUL) 2 MG tablet Take 2 mg by mouth 2 (two) times daily.    Marland Kitchen levothyroxine (SYNTHROID, LEVOTHROID) 137 MCG tablet Take 137 mcg by mouth daily before breakfast.    . metroNIDAZOLE (FLAGYL) 500 MG tablet Take 500 mg by mouth every 8 (eight) hours. For 10 days    . Multiple Vitamin (MULTIVITAMIN) tablet Take 1 tablet by mouth daily.    Marland Kitchen omeprazole (PRILOSEC) 20 MG capsule Take 1 capsule (20 mg total) by mouth 2 (two) times daily before a meal. 60 capsule 11  . pioglitazone (ACTOS) 15 MG tablet Take 15 mg by mouth daily.    . predniSONE (DELTASONE) 10 MG tablet Take 10 mg by mouth daily with breakfast.    . propranolol ER (INDERAL LA) 120 MG 24 hr capsule Take 120 mg by mouth daily.    . cholestyramine (QUESTRAN) 4 g packet Take 1 packet (4 g total) by mouth 2 (two) times daily. 60 each 3   Current Facility-Administered Medications  Medication Dose Route Frequency Provider Last Rate Last Dose  . 0.9 %  sodium chloride infusion  500 mL Intravenous Once Ladene Artist, MD        Allergies as of 05/22/2018 - Review Complete 05/22/2018  Allergen Reaction Noted  . Remicade [infliximab] Itching 10/13/2017  . Adhesive [tape] Itching, Rash, and Other (See Comments) 11/27/2017  . Latex Rash 11/27/2017    Family History  Problem Relation Age of Onset  . Heart  attack Father   . Heart disease Mother   . Hypertension Mother   . Breast cancer Sister   . Diabetes Brother   . Colon cancer Neg Hx   . Esophageal cancer Neg Hx   . Liver cancer Neg Hx   . Pancreatic cancer Neg Hx   . Rectal cancer Neg Hx   . Stomach cancer Neg Hx     Social History   Socioeconomic History  . Marital status: Married    Spouse name: Not on file  . Number of children: Not on file  . Years of education: Not on file  . Highest  education level: Not on file  Occupational History  . Occupation: Inpatient coordinator  Social Needs  . Financial resource strain: Not on file  . Food insecurity:    Worry: Not on file    Inability: Not on file  . Transportation needs:    Medical: Not on file    Non-medical: Not on file  Tobacco Use  . Smoking status: Former Smoker    Packs/day: 1.00    Years: 40.00    Pack years: 40.00    Types: Cigarettes    Last attempt to quit: 12/08/2017    Years since quitting: 0.4  . Smokeless tobacco: Never Used  Substance and Sexual Activity  . Alcohol use: No  . Drug use: No  . Sexual activity: Not on file  Lifestyle  . Physical activity:    Days per week: Not on file    Minutes per session: Not on file  . Stress: Not on file  Relationships  . Social connections:    Talks on phone: Not on file    Gets together: Not on file    Attends religious service: Not on file    Active member of club or organization: Not on file    Attends meetings of clubs or organizations: Not on file    Relationship status: Not on file  . Intimate partner violence:    Fear of current or ex partner: Not on file    Emotionally abused: Not on file    Physically abused: Not on file    Forced sexual activity: Not on file  Other Topics Concern  . Not on file  Social History Narrative  . Not on file    Review of Systems:    Constitutional: No weight loss, fever or chills Cardiovascular: No chest pain Respiratory: No SOB  Gastrointestinal: See HPI  and otherwise negative   Physical Exam:  Vital signs: BP 128/72   Pulse 78   Ht 5\' 6"  (1.676 m)   Wt 190 lb 2 oz (86.2 kg)   BMI 30.69 kg/m   Constitutional:   Pleasant Caucasian female appears to be in NAD, Well developed, Well nourished, alert and cooperative Respiratory: Respirations even and unlabored. Lungs clear to auscultation bilaterally.   No wheezes, crackles, or rhonchi.  Cardiovascular: Normal S1, S2. No MRG. Regular rate and rhythm. No peripheral edema, cyanosis or pallor.  Gastrointestinal:  Soft, nondistended, nontender. No rebound or guarding. Normal bowel sounds. No appreciable masses or hepatomegaly. Psychiatric:  Demonstrates good judgement and reason without abnormal affect or behaviors.  RELEVANT LABS AND IMAGING: CBC    Component Value Date/Time   WBC 11.9 (H) 12/04/2017 1500   RBC 4.69 12/04/2017 1500   HGB 14.9 12/04/2017 1500   HCT 44.3 12/04/2017 1500   PLT 241 12/04/2017 1500   MCV 94.5 12/04/2017 1500   MCH 31.8 12/04/2017 1500   MCHC 33.6 12/04/2017 1500   RDW 14.6 12/04/2017 1500   LYMPHSABS 2.4 12/04/2017 1500   MONOABS 0.8 12/04/2017 1500   EOSABS 0.0 12/04/2017 1500   BASOSABS 0.0 12/04/2017 1500    CMP     Component Value Date/Time   NA 140 02/18/2018 1225   K 4.5 02/18/2018 1225   CL 101 02/18/2018 1225   CO2 19 02/18/2018 1225   GLUCOSE 271 (H) 02/18/2018 1225   BUN 9 02/18/2018 1225   CREATININE 0.52 02/18/2018 1225   CALCIUM 9.5 02/18/2018 1225   PROT 6.9 10/13/2017 1043   ALBUMIN 4.0 10/13/2017 1043  AST 20 10/13/2017 1043   ALT 21 10/13/2017 1043   ALKPHOS 92 10/13/2017 1043   BILITOT 0.5 10/13/2017 1043   GFRNONAA >60 12/04/2017 1500   GFRAA >60 12/04/2017 1500    Assessment: 1.  Chronic diarrhea: Continues with loose stool, regardless of Glycopyrrolate and Imodium; consider relation to status post cholecystectomy/bile dumping versus IBS  Plan: 1.  Recommend the patient increase her Cholestyramine to twice daily  dosing.  She can discuss flavoring with the pharmacy. 2.  Also recommend patient start IBGard 2 capsules twice daily x1 month, hopefully we can titrate this down with time.  Provided her with samples and a coupon. 3.  Can stop Glycopyrrolate for now 4.  Patient to follow in clinic with me in 4-6 weeks or sooner if necessary.  Ellouise Newer, PA-C [facility] 05/22/2018, 4:25 PM  Cc: Jani Gravel, MD

## 2018-05-22 NOTE — Patient Instructions (Signed)
We have sent the following medications to your pharmacy for you to pick up at your convenience:  Cholestyramine twice a day   Start IB gard 2 capsules twice a day.

## 2018-05-25 NOTE — Progress Notes (Signed)
Reviewed and agree with initial management plan.  Cardell Rachel T. Jovane Foutz, MD FACG 

## 2018-06-19 ENCOUNTER — Ambulatory Visit: Payer: 59 | Admitting: Physician Assistant

## 2018-09-12 IMAGING — CR DG CERVICAL SPINE COMPLETE 4+V
8 series · 8 of 8 positions shown · non-contrast
Comparison: Cervical MRI October 29, 2017; intraoperative cervical
image December 08, 2017

CLINICAL DATA: Cervicalgia.  Recent fusion

EXAM:
CERVICAL SPINE - COMPLETE 4+ VIEW

[w cervical spine lat]
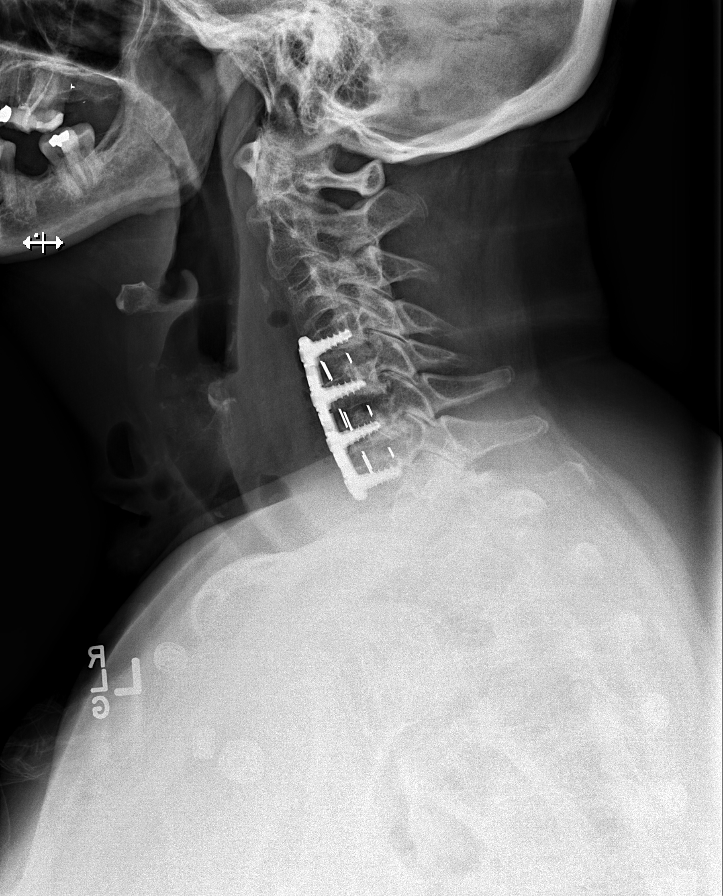

[w cervical spine ap_obl (1 of 4)]
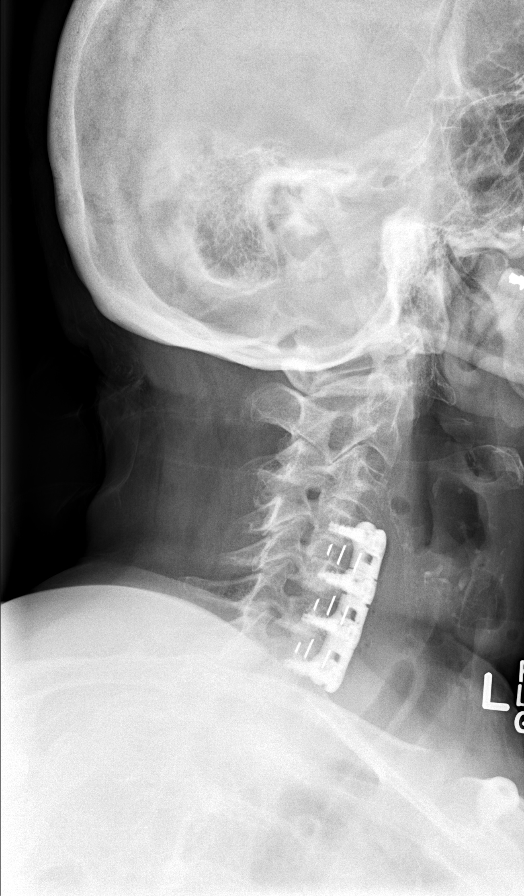

[w cervical spine ap_obl (2 of 4)]
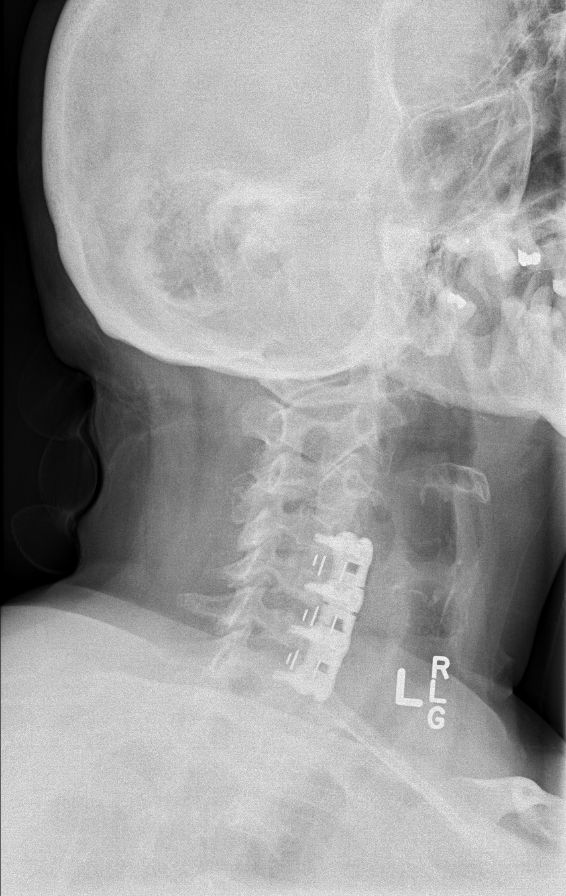

[w cervical spine ap_obl (3 of 4)]
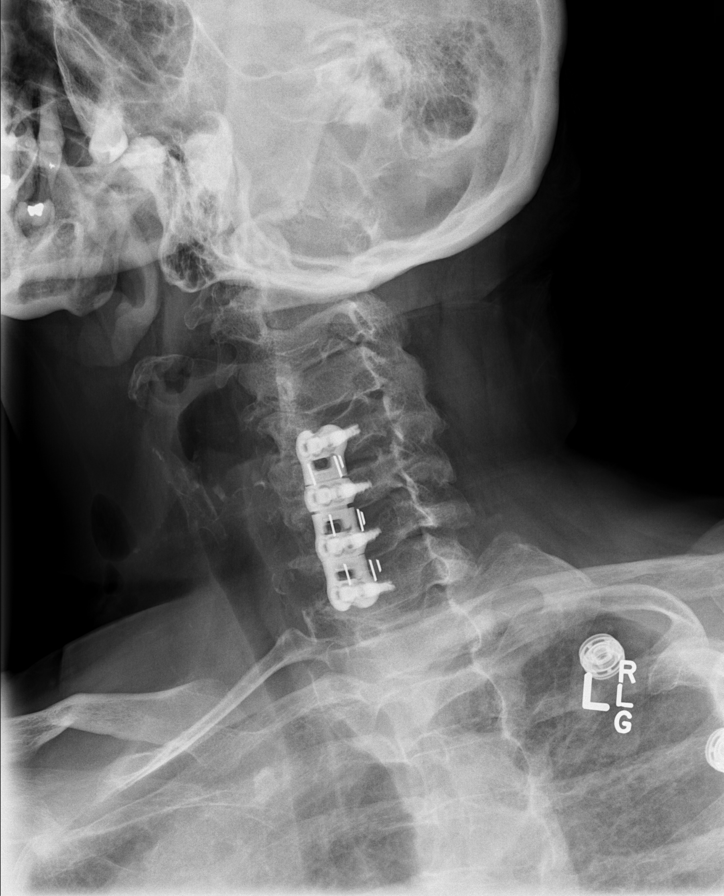

[w cervical spine ap_obl (4 of 4)]
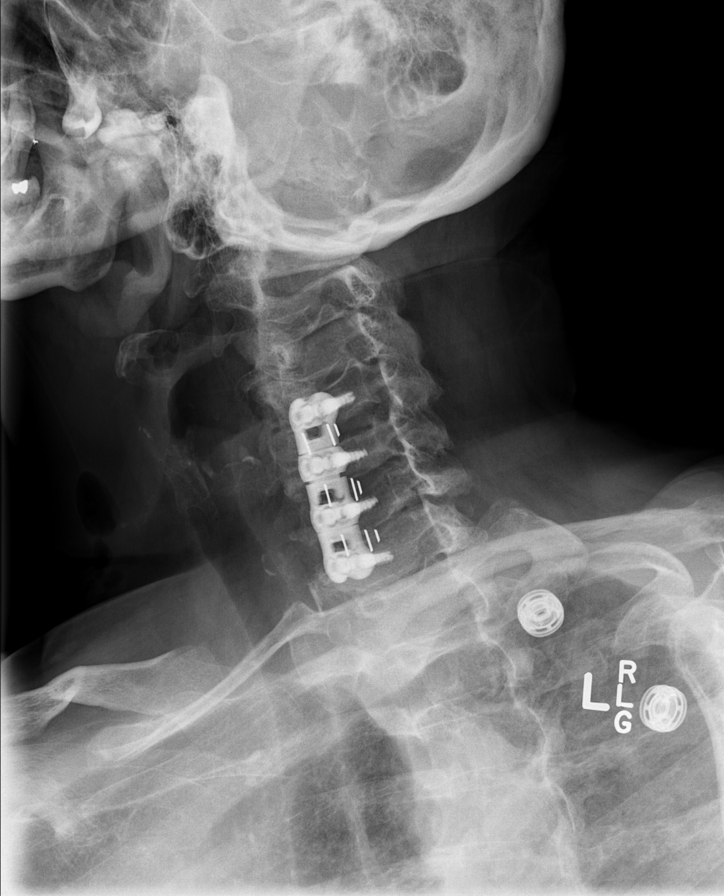

[w cervical spine ap]
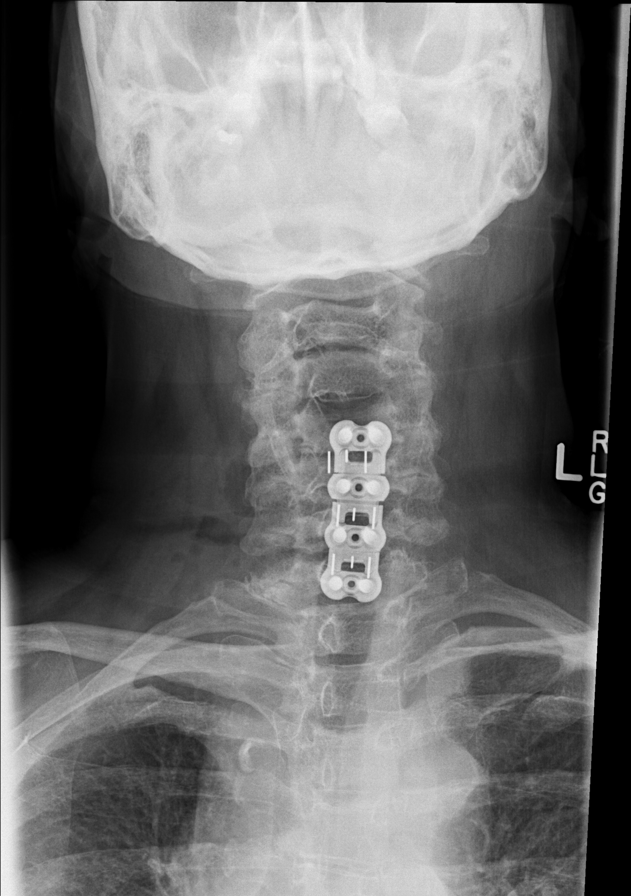

[w cervical spine odontoid (1 of 2)]
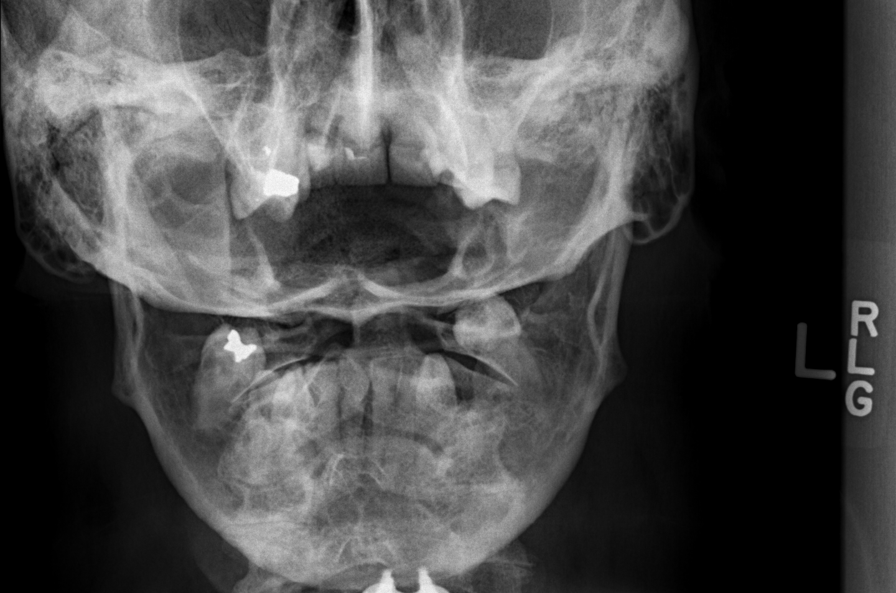

[w cervical spine odontoid (2 of 2)]
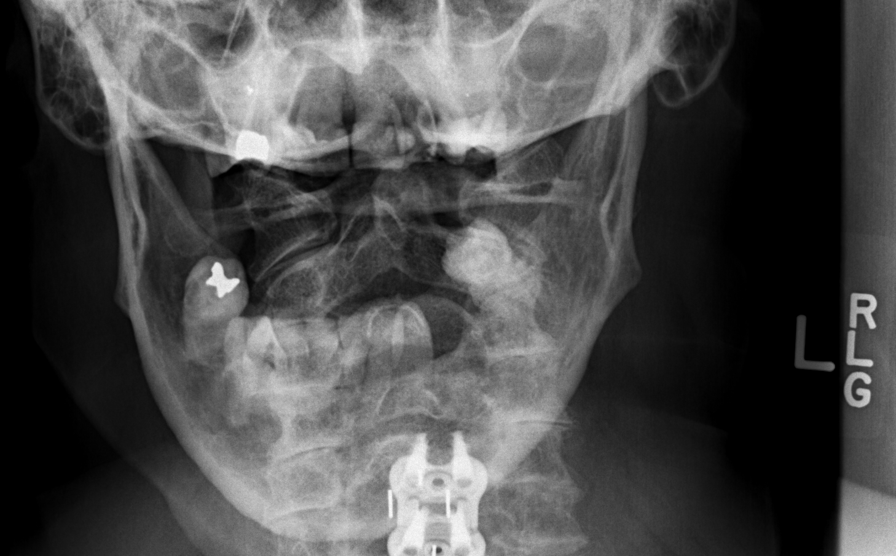

[8 of 8 positions shown; findings below may reference images not displayed]

FINDINGS: Frontal, lateral, open-mouth odontoid, and bilateral oblique views
were obtained. The patient is status post anterior screw and plate
fixation from C4-C7. There are disc spacers at C4-5, C5-6, and C6-7.
The support hardware appears intact. There is no demonstrable
fracture or spondylolisthesis. Prevertebral soft tissues and
predental space regions are normal. The disc spaces appear
unremarkable. There is relatively mild bony hypertrophy on the right
at C3-4 and C4-5 and on the left at C4-5, felt to be due to a degree
of facet osteoarthritic change. Lung apices are clear.
IMPRESSION: Areas of facet osteoarthritic change. Postoperative change from
C4-C7 with support hardware intact. No appreciable disc space
narrowing. No fracture or spondylolisthesis.

## 2018-11-27 ENCOUNTER — Emergency Department (HOSPITAL_COMMUNITY)
Admission: EM | Admit: 2018-11-27 | Discharge: 2018-11-27 | Disposition: A | Discharge status: Home / Self Care | Payer: 59 | Attending: Emergency Medicine | Admitting: Emergency Medicine

## 2018-11-27 ENCOUNTER — Other Ambulatory Visit: Payer: Self-pay

## 2018-11-27 ENCOUNTER — Emergency Department (HOSPITAL_COMMUNITY): Payer: 59

## 2018-11-27 ENCOUNTER — Encounter (HOSPITAL_COMMUNITY): Payer: Self-pay | Admitting: Emergency Medicine

## 2018-11-27 DIAGNOSIS — R1110 Vomiting, unspecified: Secondary | ICD-10-CM

## 2018-11-27 DIAGNOSIS — Z87891 Personal history of nicotine dependence: Secondary | ICD-10-CM | POA: Insufficient documentation

## 2018-11-27 DIAGNOSIS — Z7984 Long term (current) use of oral hypoglycemic drugs: Secondary | ICD-10-CM | POA: Insufficient documentation

## 2018-11-27 DIAGNOSIS — Z79899 Other long term (current) drug therapy: Secondary | ICD-10-CM | POA: Insufficient documentation

## 2018-11-27 DIAGNOSIS — E039 Hypothyroidism, unspecified: Secondary | ICD-10-CM | POA: Diagnosis not present

## 2018-11-27 DIAGNOSIS — R112 Nausea with vomiting, unspecified: Secondary | ICD-10-CM | POA: Diagnosis not present

## 2018-11-27 DIAGNOSIS — E119 Type 2 diabetes mellitus without complications: Secondary | ICD-10-CM | POA: Insufficient documentation

## 2018-11-27 DIAGNOSIS — R197 Diarrhea, unspecified: Secondary | ICD-10-CM | POA: Insufficient documentation

## 2018-11-27 DIAGNOSIS — J449 Chronic obstructive pulmonary disease, unspecified: Secondary | ICD-10-CM | POA: Insufficient documentation

## 2018-11-27 DIAGNOSIS — Z91040 Latex allergy status: Secondary | ICD-10-CM | POA: Diagnosis not present

## 2018-11-27 LAB — CBC
HCT: 47.5 % — ABNORMAL HIGH (ref 36.0–46.0)
Hemoglobin: 15.7 g/dL — ABNORMAL HIGH (ref 12.0–15.0)
MCH: 33.2 pg (ref 26.0–34.0)
MCHC: 33.1 g/dL (ref 30.0–36.0)
MCV: 100.4 fL — ABNORMAL HIGH (ref 80.0–100.0)
Platelets: 263 10*3/uL (ref 150–400)
RBC: 4.73 MIL/uL (ref 3.87–5.11)
RDW: 13.6 % (ref 11.5–15.5)
WBC: 11.4 10*3/uL — ABNORMAL HIGH (ref 4.0–10.5)
nRBC: 0 % (ref 0.0–0.2)

## 2018-11-27 LAB — COMPREHENSIVE METABOLIC PANEL
ALT: 32 U/L (ref 0–44)
AST: 62 U/L — ABNORMAL HIGH (ref 15–41)
Albumin: 4.6 g/dL (ref 3.5–5.0)
Alkaline Phosphatase: 98 U/L (ref 38–126)
Anion gap: 17 — ABNORMAL HIGH (ref 5–15)
BUN: 12 mg/dL (ref 8–23)
CO2: 24 mmol/L (ref 22–32)
Calcium: 8.7 mg/dL — ABNORMAL LOW (ref 8.9–10.3)
Chloride: 98 mmol/L (ref 98–111)
Creatinine, Ser: 0.66 mg/dL (ref 0.44–1.00)
GFR calc Af Amer: >60 mL/min (ref >60)
GFR calc non Af Amer: >60 mL/min (ref >60)
Glucose, Bld: 221 mg/dL — ABNORMAL HIGH (ref 70–99)
Potassium: 4.3 mmol/L (ref 3.5–5.1)
Sodium: 139 mmol/L (ref 135–145)
Total Bilirubin: 1.5 mg/dL — ABNORMAL HIGH (ref 0.3–1.2)
Total Protein: 8.2 g/dL — ABNORMAL HIGH (ref 6.5–8.1)

## 2018-11-27 LAB — URINALYSIS, ROUTINE W REFLEX MICROSCOPIC
Bacteria, UA: NONE SEEN
Bilirubin Urine: NEGATIVE
Glucose, UA: NEGATIVE mg/dL
Ketones, ur: 20 mg/dL — AB
Leukocytes, UA: NEGATIVE
Nitrite: NEGATIVE
Protein, ur: NEGATIVE mg/dL
Specific Gravity, Urine: 1.016 (ref 1.005–1.030)
pH: 5 (ref 5.0–8.0)

## 2018-11-27 LAB — LIPASE, BLOOD: Lipase: 25 U/L (ref 11–51)

## 2018-11-27 MED ORDER — METOCLOPRAMIDE HCL 5 MG/ML IJ SOLN
10.0000 mg | Freq: Once | INTRAMUSCULAR | Status: AC
  Administered 2018-11-27: 10 mg via INTRAVENOUS
  Filled 2018-11-27: qty 2

## 2018-11-27 MED ORDER — ONDANSETRON HCL 4 MG/2ML IJ SOLN
4.0000 mg | Freq: Once | INTRAMUSCULAR | Status: AC | PRN
  Administered 2018-11-27: 4 mg via INTRAVENOUS

## 2018-11-27 MED ORDER — ONDANSETRON 8 MG PO TBDP: 8.0000 mg | ORAL_TABLET | Freq: Three times a day (TID) | ORAL | 0 refills | Status: DC | PRN

## 2018-11-27 MED ORDER — MORPHINE SULFATE (PF) 4 MG/ML IV SOLN
4.0000 mg | Freq: Once | INTRAVENOUS | Status: AC
  Administered 2018-11-27: 4 mg via INTRAVENOUS
  Filled 2018-11-27: qty 1

## 2018-11-27 MED ORDER — SODIUM CHLORIDE 0.9 % IV BOLUS
1000.0000 mL | Freq: Once | INTRAVENOUS | Status: AC
  Administered 2018-11-27: 1000 mL via INTRAVENOUS

## 2018-11-27 NOTE — ED Notes (Signed)
Bed: SY45 Expected date:  Expected time:  Means of arrival:  Comments: EMS-V/D

## 2018-11-27 NOTE — ED Triage Notes (Signed)
Pt woke this morning with nausea, diarrhea.

## 2018-11-27 NOTE — ED Provider Notes (Signed)
[facility] DEPT Provider Note   CSN: [id number] Arrival date & time: 11/27/18  0808     History   Chief Complaint Chief Complaint  Patient presents with  . Nausea  . Emesis  . Diarrhea    HPI Jessica [name] is a 67 y.o. female.  HPI 67 year old female presents to the emergency department nausea vomiting and diarrhea.  No blood in her vomit.  No blood in her stool.  No recent sick contacts.  No recent antibiotics.  Denies recent travel.  No fevers or chills.  Denies focal abdominal pain.  No recent change in medications.  Symptoms are moderate in severity   Past Medical History:  Diagnosis Date  . Allergy   . Anxiety   . Asthma   . Cervical stenosis of spine   . COPD (chronic obstructive pulmonary disease) ([place])   . DDD (degenerative disc disease)   . Depression   . Diabetes mellitus without complication ([place])   . Fatty liver   . Gastric ulcer   . GERD (gastroesophageal reflux disease)   . Heart murmur   . Hyperglycemia   . Hyperlipidemia   . Hypothyroidism   . Irregular heartbeat   . Migraines   . Multiple gastric ulcers   . Obesity   . Osteoarthritis   . Rheumatoid arthritis ([place])   . Sciatica   . Scoliosis   . Vitamin D deficiency     Patient Active Problem List   Diagnosis Date Noted  . Foraminal stenosis of cervical region 12/08/2017    Past Surgical History:  Procedure Laterality Date  . ANTERIOR CERVICAL DECOMP/DISCECTOMY FUSION  12/08/2017   Procedure: Anterior Cervical Decompression/Discectomy Fusion - Cervical four-Cervical five - Cervical five-Cervical six - Cervical six-Cervical seven;  Surgeon: Earnie Larsson, MD;  Location: [facility];  Service: Neurosurgery;;  . APPENDECTOMY    . CHOLECYSTECTOMY  2010  . COLONOSCOPY    . COLOSTOMY     removed 3 inches of colon 1987  . 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 Perforated Colon  1986   by Dr. Margot Chimes  . POLYPECTOMY    . TOTAL  ABDOMINAL HYSTERECTOMY  1989   Dr. Nori Riis  . UPPER GASTROINTESTINAL ENDOSCOPY    . UPPER GI ENDOSCOPY       OB History   No obstetric history on file.      Home Medications    Prior to Admission medications   Medication Sig Start Date End Date Taking? Authorizing Provider  ALPRAZolam Duanne Moron) 0.5 MG tablet Take 0.5 mg by mouth at bedtime.   Yes [provider]  butalbital-acetaminophen-caffeine (FIORICET, ESGIC) 50-325-40 MG per tablet Take 1 tablet by mouth 3 (three) times daily as needed for headache or migraine.    Yes [provider]  ergocalciferol (VITAMIN D2) 50000 UNITS capsule Take 50,000 Units by mouth every Wednesday.    Yes [provider]  ezetimibe-simvastatin (VYTORIN) 10-40 MG tablet Take 1 tablet by mouth daily.   Yes [provider]  gabapentin (NEURONTIN) 300 MG capsule Take 300 mg by mouth 2 (two) times daily as needed (nerve pain).    Yes [provider]  glimepiride (AMARYL) 4 MG tablet Take 4 mg by mouth daily with breakfast.   Yes [provider]  glycopyrrolate (ROBINUL) 2 MG tablet Take 2 mg by mouth 2 (two) times daily.   Yes [provider]  levothyroxine (SYNTHROID, [place]) [place]  tablet Take 137 mcg by mouth daily before breakfast.   Yes [provider]  Multiple Vitamin (MULTIVITAMIN) tablet Take 1 tablet by mouth daily.   Yes [provider]  omeprazole (PRILOSEC) 20 MG capsule Take 1 capsule (20 mg total) by mouth 2 (two) times daily before a meal. 02/18/18  Yes Ladene Artist, MD  pioglitazone (ACTOS) 15 MG tablet Take 15 mg by mouth daily.   Yes [provider]  predniSONE (DELTASONE) 10 MG tablet Take 10 mg by mouth daily with breakfast.   Yes [provider]  propranolol ER (INDERAL LA) 120 MG 24 hr capsule Take 120 mg by mouth daily.   Yes [provider]  cholestyramine (QUESTRAN) 4 g packet Take 1 packet (4 g total) by mouth 2 (two) times  daily. Patient not taking: Reported on 11/27/2018 05/22/18   Levin Erp, PA  cyclobenzaprine (FLEXERIL) 10 MG tablet Take 1 tablet (10 mg total) by mouth 3 (three) times daily as needed for muscle spasms. Patient not taking: Reported on 11/27/2018 12/09/17   Earnie Larsson, MD  ondansetron (ZOFRAN ODT) 8 MG disintegrating tablet Take 1 tablet (8 mg total) by mouth every 8 (eight) hours as needed for nausea or vomiting. 11/27/18   Jola Schmidt, MD    Family History Family History  Problem Relation Age of Onset  . Heart attack Father   . Heart disease Mother   . Hypertension Mother   . Breast cancer Sister   . Diabetes Brother   . Colon cancer Neg Hx   . Esophageal cancer Neg Hx   . Liver cancer Neg Hx   . Pancreatic cancer Neg Hx   . Rectal cancer Neg Hx   . Stomach cancer Neg Hx     Social History Social History   Tobacco Use  . Smoking status: Former Smoker    Packs/day: 1.00    Years: 40.00    Pack years: 40.00    Types: Cigarettes    Last attempt to quit: 12/08/2017    Years since quitting: 0.9  . Smokeless tobacco: Never Used  Substance Use Topics  . Alcohol use: No  . Drug use: No     Allergies   Remicade [infliximab]; Adhesive [tape]; and Latex   Review of Systems Review of Systems  All other systems reviewed and are negative.    Physical Exam Updated Vital Signs BP (!) 120/109   Pulse (!) 103   Temp (!) 97.4 F (36.3 C) (Oral)   Resp 20   SpO2 94%   Physical Exam Vitals signs and nursing note reviewed.  Constitutional:      General: She is not in acute distress.    Appearance: She is well-developed.  HENT:     Head: Normocephalic and atraumatic.  Neck:     Musculoskeletal: Normal range of motion.  Cardiovascular:     Rate and Rhythm: Normal rate and regular rhythm.     Heart sounds: Normal heart sounds.  Pulmonary:     Effort: Pulmonary effort is normal.     Breath sounds: Normal breath sounds.  Abdominal:     General: There is  no distension.     Palpations: Abdomen is soft.     Tenderness: There is no abdominal tenderness.  Musculoskeletal: Normal range of motion.  Skin:    General: Skin is warm and dry.  Neurological:     Mental Status: She is alert and oriented to person, place, and time.  Psychiatric:  Judgment: Judgment normal.      ED Treatments / Results  Labs (all labs ordered are listed, but only abnormal results are displayed) Labs Reviewed  COMPREHENSIVE METABOLIC PANEL - Abnormal; Notable for the following components:      Result Value   Glucose, Bld 221 (*)    Calcium 8.7 (*)    Total Protein 8.2 (*)    AST 62 (*)    Total Bilirubin 1.5 (*)    Anion gap 17 (*)    All other components within normal limits  CBC - Abnormal; Notable for the following components:   WBC 11.4 (*)    Hemoglobin 15.7 (*)    HCT 47.5 (*)    MCV 100.4 (*)    All other components within normal limits  URINALYSIS, ROUTINE W REFLEX MICROSCOPIC - Abnormal; Notable for the following components:   Hgb urine dipstick SMALL (*)    Ketones, ur 20 (*)    All other components within normal limits  LIPASE, BLOOD    EKG None  Radiology Dg Abd 2 Views  Result Date: 11/27/2018 CLINICAL DATA:  Nausea, diarrhea EXAM: ABDOMEN - 2 VIEW COMPARISON:  CT abdomen pelvis of 02/20/2018 FINDINGS: Supine and erect views the abdomen show a paucity of bowel gas. No bowel obstruction is seen. No free air is noted on the erect view. Surgical clips are present in the right upper quadrant from prior cholecystectomy with clips noted in the right abdomen is well apparently due to prior perforated colon with repair. No opaque calculi are noted. There is cardiomegaly noted. There is thoracolumbar scoliosis noted as well with moderate abdominal aortic atherosclerosis present. IMPRESSION: 1. Paucity of bowel gas. No evidence of bowel obstruction or free air. 2. Cardiomegaly. 3. Thoracolumbar scoliosis. Electronically Signed   By: Ivar Drape M.D.   On: 11/27/2018 09:59    Procedures Procedures (including critical care time)  Medications Ordered in ED Medications  ondansetron (ZOFRAN) injection 4 mg (4 mg Intravenous Given 11/27/18 0815)  morphine 4 MG/ML injection 4 mg (4 mg Intravenous Given 11/27/18 0819)  sodium chloride 0.9 % bolus 1,000 mL (0 mLs Intravenous Stopped 11/27/18 0945)  metoCLOPramide (REGLAN) injection 10 mg (10 mg Intravenous Given 11/27/18 0938)     Initial Impression / Assessment and Plan / ED Course  I have reviewed the triage vital signs and the nursing notes.  Pertinent labs & imaging results that were available during my care of the patient were reviewed by me and considered in my medical decision making (see chart for details).     12:30 PM Feels much better after symptomatic treatment here in the emergency department.  Work-up in the ER without significant abnormality.  Repeat abdominal exam without focal tenderness.  No indication for advanced imaging of her abdomen.  Symptoms improved.  Tolerating oral fluids.  Likely viral in nature.  Close primary care follow-up.  Patient encouraged to return to the emergency department for new or worsening symptoms  Final Clinical Impressions(s) / ED Diagnoses   Final diagnoses:  Vomiting  Nausea vomiting and diarrhea    ED Discharge Orders         Ordered    ondansetron (ZOFRAN ODT) 8 MG disintegrating tablet  Every 8 hours PRN     11/27/18 1229           Jola Schmidt, MD 11/27/18 1231

## 2019-09-20 DIAGNOSIS — R197 Diarrhea, unspecified: Secondary | ICD-10-CM | POA: Diagnosis not present

## 2019-09-21 ENCOUNTER — Telehealth: Payer: Self-pay | Admitting: Gastroenterology

## 2019-09-21 NOTE — Telephone Encounter (Signed)
Pt is experiencing nausea and diarrhea and requested an appointment ASAP.

## 2019-09-21 NOTE — Telephone Encounter (Signed)
Patient was seen by her PCP yesterday for several days of diarrhea, N&V.  She was given a phenergan shot yesterday and she is not feeling any better today.  She will come in and see Alonza Bogus, PA tomorrow at 2:30

## 2019-09-22 ENCOUNTER — Encounter: Payer: Self-pay | Admitting: Gastroenterology

## 2019-09-22 ENCOUNTER — Ambulatory Visit (INDEPENDENT_AMBULATORY_CARE_PROVIDER_SITE_OTHER): Payer: No Typology Code available for payment source | Admitting: Gastroenterology

## 2019-09-22 VITALS — BP 104/62 | HR 42 | Temp 98.5°F | Ht 66.0 in | Wt 186.2 lb

## 2019-09-22 DIAGNOSIS — K529 Noninfective gastroenteritis and colitis, unspecified: Secondary | ICD-10-CM

## 2019-09-22 DIAGNOSIS — R110 Nausea: Secondary | ICD-10-CM

## 2019-09-22 MED ORDER — OMEPRAZOLE 40 MG PO CPDR: 40.0000 mg | DELAYED_RELEASE_CAPSULE | Freq: Two times a day (BID) | ORAL | 2 refills | Status: DC

## 2019-09-22 MED ORDER — ONDANSETRON HCL 4 MG PO TABS: 4.0000 mg | ORAL_TABLET | Freq: Four times a day (QID) | ORAL | 1 refills | Status: DC | PRN

## 2019-09-22 NOTE — Progress Notes (Signed)
09/22/2019 Jessica [name] [id number] 15-Nov-1951   HISTORY OF PRESENT ILLNESS: This is a 68 year old female who is a patient of Dr. Lynne Leader.  She was last seen here by Ellouise Newer, PA-C, in June 2019 for complaints of chronic diarrhea.  Her diarrhea is thought to be due to postcholecystectomy state/bile salt dumping versus IBS vs due to changes from partial colectomy.  She has Questran twice daily on her medication list, but says that she does not use it because it does not want to mix well in liquid and she does not like to take it.  She tells me she has sometimes 5-6 bowel movements per day.  Apparently her PCP, Dr. Maudie Mercury, performed a stool study recently, earlier this week, but she does not have those results yet.  More recently, she has been experiencing a lot of nausea.  She does not vomit.  She describes a "raw" feeling in her abdomen, but no pain per se.  She is on omeprazole 20 mg twice daily.  She has Zofran on her medication list, but says that she does not have that and does not recall taking that anytime recently.  She says that her PCP gave her a shot of Phenergan in the office, but it did not seem to help.  She reports feeling depressed.  She had both EGD and colonoscopy in April 2019 at which time she was found to have the following:  Colonoscopy showed:  - Patent end-to-end colo-colonic anastomosis, characterized by healthy appearing mucosa. - Four 6 to 7 mm polyps in the descending colon and in the transverse colon, removed with acold snare. Resected and retrieved. - One 4 mm polyp in the descending colon, removed with a cold biopsy forceps. Resected and retrieved. - Moderate diverticulosis in the left colon. There was no evidence of diverticular bleeding. - Erythematous mucosa in the distal rectum. Biopsied. - The examination was otherwise normal on direct and retroflexion views. Random biopsies obtained.  EGD showed:  - Normal esophagus. - Erythematous mucosa in the  gastric body and antrum. Biopsied. - Normal duodenal bulb and second portion of the duodenum.  Pathology showed the following:  1. Surgical [P], random sites - BENIGN COLONIC MUCOSA. - NO SIGNIFICANT INFLAMMATION OR OTHER ABNORMALITIES IDENTIFIED. 2. Surgical [P], transverse, descending, polyp (5) - TUBULAR ADENOMA(S). (MULTIPLE FRAGMENTS). - HYPERPLASTIC POLYP. - HIGH GRADE DYSPLASIA IS NOT IDENTIFIED. 3. Surgical [P], rectal erythema - FEATURES CONSISTENT WITH ISCHEMIC COLITIS. - THERE IS NO EVIDENCE OF IDIOPATHIC INFLAMMATORY BOWEL DISEASE, DYSPLASIA OR MALIGNANCY. 4. Surgical [P], gastric antrum and body erythema - CHRONIC INACTIVE GASTRITIS, MILD. - THERE IS NO EVIDENCE OF HELICOBACTER PYLORI, DYSPLASIA OR MALIGNANCY. - SEE COMMENT.   Past Medical History:  Diagnosis Date  . Allergy   . Anxiety   . Asthma   . Cervical stenosis of spine   . COPD (chronic obstructive pulmonary disease) ([place])   . DDD (degenerative disc disease)   . Depression   . Diabetes mellitus without complication ([place])   . Fatty liver   . Gastric ulcer   . GERD (gastroesophageal reflux disease)   . Heart murmur   . Hyperglycemia   . Hyperlipidemia   . Hypothyroidism   . Irregular heartbeat   . Migraines   . Multiple gastric ulcers   . Obesity   . Osteoarthritis   . Rheumatoid arthritis ([place])   . Sciatica   . Scoliosis   . Vitamin D deficiency    Past Surgical History:  Procedure Laterality Date  . ANTERIOR CERVICAL DECOMP/DISCECTOMY FUSION  12/08/2017   Procedure: Anterior Cervical Decompression/Discectomy Fusion - Cervical four-Cervical five - Cervical five-Cervical six - Cervical six-Cervical seven;  Surgeon: Earnie Larsson, MD;  Location: [facility];  Service: Neurosurgery;;  . APPENDECTOMY    . CHOLECYSTECTOMY  2010  . COLONOSCOPY    . COLOSTOMY     removed 3 inches of colon 1987  . 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 Perforated Colon  1986   by Dr.  Margot Chimes  . POLYPECTOMY    . TOTAL ABDOMINAL HYSTERECTOMY  1989   Dr. Nori Riis  . UPPER GASTROINTESTINAL ENDOSCOPY    . UPPER GI ENDOSCOPY      reports that she quit smoking about 21 months ago. Her smoking use included cigarettes. She has a 40.00 pack-year smoking history. She has never used smokeless tobacco. She reports that she does not drink alcohol or use drugs. family history includes Breast cancer in her sister; Diabetes in her brother; Heart attack in her father; Heart disease in her mother; Hypertension in her mother. Allergies  Allergen Reactions  . Remicade [Infliximab] Itching  . Adhesive [Tape] Itching, Rash and Other (See Comments)    Paper tape okay  . Latex Rash      Outpatient Encounter Medications as of 09/22/2019  Medication Sig  . ALPRAZolam (XANAX) 0.5 MG tablet Take 0.5 mg by mouth at bedtime.  . butalbital-acetaminophen-caffeine (FIORICET, ESGIC) 50-325-40 MG per tablet Take 1 tablet by mouth 3 (three) times daily as needed for headache or migraine.   . cholestyramine (QUESTRAN) 4 g packet Take 1 packet (4 g total) by mouth 2 (two) times daily.  . cyclobenzaprine (FLEXERIL) 10 MG tablet Take 1 tablet (10 mg total) by mouth 3 (three) times daily as needed for muscle spasms.  . ergocalciferol (VITAMIN D2) 50000 UNITS capsule Take 50,000 Units by mouth every Wednesday.   . ezetimibe-simvastatin (VYTORIN) 10-40 MG tablet Take 1 tablet by mouth daily.  Marland Kitchen gabapentin (NEURONTIN) 300 MG capsule Take 300 mg by mouth 2 (two) times daily as needed (nerve pain).   Marland Kitchen glimepiride (AMARYL) 4 MG tablet Take 4 mg by mouth daily with breakfast.  . glycopyrrolate (ROBINUL) 2 MG tablet Take 2 mg by mouth 2 (two) times daily.  Marland Kitchen levothyroxine (SYNTHROID, LEVOTHROID) 137 MCG tablet Take 137 mcg by mouth daily before breakfast.  . Multiple Vitamin (MULTIVITAMIN) tablet Take 1 tablet by mouth daily.  Marland Kitchen omeprazole (PRILOSEC) 20 MG capsule Take 1 capsule (20 mg total) by mouth 2 (two) times  daily before a meal.  . ondansetron (ZOFRAN ODT) 8 MG disintegrating tablet Take 1 tablet (8 mg total) by mouth every 8 (eight) hours as needed for nausea or vomiting.  . pioglitazone (ACTOS) 15 MG tablet Take 15 mg by mouth daily.  . predniSONE (DELTASONE) 10 MG tablet Take 10 mg by mouth daily with breakfast.  . propranolol ER (INDERAL LA) 120 MG 24 hr capsule Take 120 mg by mouth daily.   Facility-Administered Encounter Medications as of 09/22/2019  Medication  . 0.9 %  sodium chloride infusion     REVIEW OF SYSTEMS  : All other systems reviewed and negative except where noted in the History of Present Illness.   PHYSICAL EXAM: BP 104/62 (BP Location: Left Arm, Patient Position: Sitting, Cuff Size: Normal)   Pulse (!) 42   Temp 98.5 F (36.9 C) (Oral)   Ht 5\' 6"  (  1.676 m)   Wt 186 lb 4 oz (84.5 kg)   BMI 30.06 kg/m  General: Well developed white female in no acute distress Head: Normocephalic and atraumatic Eyes:  Sclerae anicteric, conjunctiva pink. Ears: Normal auditory acuity Lungs: Clear throughout to auscultation; no increased WOB. Heart: Bradycardic.  No murmurs noted. Abdomen: Soft, non-distended.  BS present.  Non-tender. Musculoskeletal: Symmetrical with no gross deformities  Skin: No lesions on visible extremities Extremities: No edema  Neurological: Alert oriented x 4, grossly non-focal Psychological:  Alert and cooperative. Normal mood and affect  ASSESSMENT AND PLAN: *Chronic diarrhea: This is a chronic issue thought to be related to postcholecystectomy state/bile salt dumping versus IBS vs changes related to partial colectomy.  Has Questran, but does not use it because it does not mix well in liquid so she does not like to take it.  Advised to restart using it twice a day as prescribed and indicated, but to put it in applesauce and try taking it that way instead. *Nausea: Describes a "raw" feeling in her abdomen, but not pain per se.  I am going to increase her  omeprazole to 40 mg twice daily instead of 20 mg twice daily.  Prescription sent.  Will give a prescription for Zofran to use for the nausea.  After discussion with the patient I wonder if the symptoms may be due to depression.  **We will get lab results, stool study results, and office visit notes from her PCP.  **Addendum: Received records from her PCP.  Stool GI pathogen panel was negative.  General lab results all unremarkable except for a mildly elevated white blood cell count at 11.6 and a mildly elevated hemoglobin at 15.4 g.  These labs were from September 13, 2019.   CC:  Jani Gravel, MD

## 2019-09-22 NOTE — Patient Instructions (Addendum)
Questran twice daily in applesauce.  Increase Omeprazole to 40mg  - twice daily   We have sent the following medications to your pharmacy for you to pick up at your convenience: Omeprazole and Zofran   Call back in one - two weeks- Ask for Patty-RN to give update on symptoms.   We will request OV, labs and stool studies from PCP.   Thank you for choosing me and [facility].  Janett Billow Zehr-PA

## 2019-09-25 ENCOUNTER — Other Ambulatory Visit: Payer: Self-pay

## 2019-09-25 ENCOUNTER — Encounter (HOSPITAL_BASED_OUTPATIENT_CLINIC_OR_DEPARTMENT_OTHER): Payer: Self-pay | Admitting: Emergency Medicine

## 2019-09-25 ENCOUNTER — Emergency Department (HOSPITAL_BASED_OUTPATIENT_CLINIC_OR_DEPARTMENT_OTHER)
Admission: EM | Admit: 2019-09-25 | Discharge: 2019-09-25 | Disposition: A | Discharge status: Home / Self Care | Payer: No Typology Code available for payment source | Attending: Emergency Medicine | Admitting: Emergency Medicine

## 2019-09-25 DIAGNOSIS — R110 Nausea: Secondary | ICD-10-CM | POA: Diagnosis not present

## 2019-09-25 DIAGNOSIS — E039 Hypothyroidism, unspecified: Secondary | ICD-10-CM | POA: Diagnosis not present

## 2019-09-25 DIAGNOSIS — J449 Chronic obstructive pulmonary disease, unspecified: Secondary | ICD-10-CM | POA: Insufficient documentation

## 2019-09-25 DIAGNOSIS — E119 Type 2 diabetes mellitus without complications: Secondary | ICD-10-CM | POA: Insufficient documentation

## 2019-09-25 DIAGNOSIS — Z79899 Other long term (current) drug therapy: Secondary | ICD-10-CM | POA: Insufficient documentation

## 2019-09-25 DIAGNOSIS — Z87891 Personal history of nicotine dependence: Secondary | ICD-10-CM | POA: Insufficient documentation

## 2019-09-25 DIAGNOSIS — R197 Diarrhea, unspecified: Secondary | ICD-10-CM | POA: Diagnosis not present

## 2019-09-25 DIAGNOSIS — Z91040 Latex allergy status: Secondary | ICD-10-CM | POA: Insufficient documentation

## 2019-09-25 LAB — COMPREHENSIVE METABOLIC PANEL WITH GFR
ALT: 22 U/L (ref 0–44)
AST: 46 U/L — ABNORMAL HIGH (ref 15–41)
Albumin: 3.6 g/dL (ref 3.5–5.0)
Alkaline Phosphatase: 78 U/L (ref 38–126)
Anion gap: 12 (ref 5–15)
BUN: 10 mg/dL (ref 8–23)
CO2: 25 mmol/L (ref 22–32)
Calcium: 8.5 mg/dL — ABNORMAL LOW (ref 8.9–10.3)
Chloride: 102 mmol/L (ref 98–111)
Creatinine, Ser: 0.66 mg/dL (ref 0.44–1.00)
GFR calc Af Amer: >60 mL/min
GFR calc non Af Amer: >60 mL/min
Glucose, Bld: 164 mg/dL — ABNORMAL HIGH (ref 70–99)
Potassium: 4.1 mmol/L (ref 3.5–5.1)
Sodium: 139 mmol/L (ref 135–145)
Total Bilirubin: 1 mg/dL (ref 0.3–1.2)
Total Protein: 6.1 g/dL — ABNORMAL LOW (ref 6.5–8.1)

## 2019-09-25 LAB — CBC WITH DIFFERENTIAL/PLATELET
Abs Immature Granulocytes: 0.02 10*3/uL (ref 0.00–0.07)
Basophils Absolute: 0.1 10*3/uL (ref 0.0–0.1)
Basophils Relative: 1 %
Eosinophils Absolute: 0.1 10*3/uL (ref 0.0–0.5)
Eosinophils Relative: 1 %
HCT: 42.2 % (ref 36.0–46.0)
Hemoglobin: 14.2 g/dL (ref 12.0–15.0)
Immature Granulocytes: 0 %
Lymphocytes Relative: 25 %
Lymphs Abs: 1.8 10*3/uL (ref 0.7–4.0)
MCH: 32.2 pg (ref 26.0–34.0)
MCHC: 33.6 g/dL (ref 30.0–36.0)
MCV: 95.7 fL (ref 80.0–100.0)
Monocytes Absolute: 0.6 10*3/uL (ref 0.1–1.0)
Monocytes Relative: 9 %
Neutro Abs: 4.5 10*3/uL (ref 1.7–7.7)
Neutrophils Relative %: 64 %
Platelets: 196 10*3/uL (ref 150–400)
RBC: 4.41 MIL/uL (ref 3.87–5.11)
RDW: 12.2 % (ref 11.5–15.5)
WBC: 7.1 10*3/uL (ref 4.0–10.5)
nRBC: 0 % (ref 0.0–0.2)

## 2019-09-25 LAB — LIPASE, BLOOD: Lipase: 17 U/L (ref 11–51)

## 2019-09-25 MED ORDER — PROMETHAZINE HCL 25 MG RE SUPP: 25.0000 mg | Freq: Three times a day (TID) | RECTAL | 0 refills | Status: DC | PRN

## 2019-09-25 MED ORDER — METOCLOPRAMIDE HCL 5 MG/ML IJ SOLN
10.0000 mg | INTRAMUSCULAR | Status: AC
  Administered 2019-09-25: 10 mg via INTRAVENOUS
  Filled 2019-09-25: qty 2

## 2019-09-25 MED ORDER — SODIUM CHLORIDE 0.9 % IV BOLUS
1000.0000 mL | Freq: Once | INTRAVENOUS | Status: AC
  Administered 2019-09-25: 1000 mL via INTRAVENOUS

## 2019-09-25 MED ORDER — LORAZEPAM 2 MG/ML IJ SOLN
1.0000 mg | Freq: Once | INTRAMUSCULAR | Status: AC
  Administered 2019-09-25: 1 mg via INTRAVENOUS
  Filled 2019-09-25: qty 1

## 2019-09-25 NOTE — ED Triage Notes (Addendum)
N/V/D x several weeks. Has seen PCP and GI and given meds without relief. States she took meds for diarrhea which has resolved.

## 2019-09-25 NOTE — ED Notes (Signed)
Pt rocking back and forth, moaning. States she feels like she can not be still., ATivan ordered per Dr. Rex Kras, and given IV

## 2019-09-25 NOTE — ED Provider Notes (Signed)
[facility] EMERGENCY DEPARTMENT Provider Note   CSN: [id number] Arrival date & time: 09/25/19  [id number]     History   Chief Complaint Chief Complaint  Patient presents with  . Emesis    HPI KRUTHI BONDOC is a 68 y.o. female.    68yo F w/ PMH incluidng RA, cholecystectomy, chronic diarrhea, colon perforation s/p colon resection in 1987 who p/w nausea. Pt was seen by her gastroenterologist yesterday for ongoing problems with nausea and diarrhea. She was given zofran and PPI was increased.  She reports ongoing severe nausea today but no vomiting. She's tried to make herself vomit but can't. She took zofran this morning without relief. She gave a stool sample to her PCP which hasn't resulted yet; she reports no diarrhea since then. Her abdomen feels "raw." She reports associated headache, no urinary symptoms, fevers, cough/cold symptoms, recent travel, or sick contacts. No recent antibiotic use.   The history is provided by the patient.  Emesis   Past Medical History:  Diagnosis Date  . Allergy   . Anxiety   . Asthma   . Cervical stenosis of spine   . COPD (chronic obstructive pulmonary disease) ([place])   . DDD (degenerative disc disease)   . Depression   . Diabetes mellitus without complication ([place])   . Fatty liver   . Gastric ulcer   . GERD (gastroesophageal reflux disease)   . Heart murmur   . Hyperglycemia   . Hyperlipidemia   . Hypothyroidism   . Irregular heartbeat   . Migraines   . Multiple gastric ulcers   . Obesity   . Osteoarthritis   . Rheumatoid arthritis ([place])   . Sciatica   . Scoliosis   . Vitamin D deficiency     Patient Active Problem List   Diagnosis Date Noted  . Foraminal stenosis of cervical region 12/08/2017    Past Surgical History:  Procedure Laterality Date  . ANTERIOR CERVICAL DECOMP/DISCECTOMY FUSION  12/08/2017   Procedure: Anterior Cervical Decompression/Discectomy Fusion - Cervical four-Cervical five - Cervical five-Cervical  six - Cervical six-Cervical seven;  Surgeon: Earnie Larsson, MD;  Location: [facility];  Service: Neurosurgery;;  . APPENDECTOMY    . CHOLECYSTECTOMY  2010  . COLONOSCOPY    . COLOSTOMY     removed 3 inches of colon 1987  . 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 Perforated Colon  1986   by Dr. Margot Chimes  . POLYPECTOMY    . TOTAL ABDOMINAL HYSTERECTOMY  1989   Dr. Nori Riis  . UPPER GASTROINTESTINAL ENDOSCOPY    . UPPER GI ENDOSCOPY       OB History   No obstetric history on file.      Home Medications    Prior to Admission medications   Medication Sig Start Date End Date Taking? Authorizing Provider  ALPRAZolam Duanne Moron) 0.5 MG tablet Take 0.5 mg by mouth at bedtime.    [provider]  butalbital-acetaminophen-caffeine (FIORICET, ESGIC) 50-325-40 MG per tablet Take 1 tablet by mouth 3 (three) times daily as needed for headache or migraine.     [provider]  cholestyramine (QUESTRAN) 4 g packet Take 1 packet (4 g total) by mouth 2 (two) times daily. 05/22/18   Levin Erp, PA  cyclobenzaprine (FLEXERIL) 10 MG tablet Take 1 tablet (10 mg total) by mouth 3 (three) times daily as needed for muscle spasms. 12/09/17   Earnie Larsson, MD  ergocalciferol (VITAMIN  D2) 50000 UNITS capsule Take 50,000 Units by mouth every Wednesday.     [provider]  ezetimibe-simvastatin (VYTORIN) 10-40 MG tablet Take 1 tablet by mouth daily.    [provider]  gabapentin (NEURONTIN) 300 MG capsule Take 300 mg by mouth 2 (two) times daily as needed (nerve pain).     [provider]  glimepiride (AMARYL) 4 MG tablet Take 4 mg by mouth daily with breakfast.    [provider]  glycopyrrolate (ROBINUL) 2 MG tablet Take 2 mg by mouth 2 (two) times daily.    [provider]  levothyroxine (SYNTHROID, LEVOTHROID) 137 MCG tablet Take 137 mcg by mouth daily before breakfast.    [provider]  Multiple Vitamin  (MULTIVITAMIN) tablet Take 1 tablet by mouth daily.    [provider]  omeprazole (PRILOSEC) 40 MG capsule Take 1 capsule (40 mg total) by mouth 2 (two) times daily. 09/22/19   Zehr, Janett Billow D, PA-C  ondansetron (ZOFRAN) 4 MG tablet Take 1 tablet (4 mg total) by mouth every 6 (six) hours as needed for nausea or vomiting. 09/22/19   Zehr, Janett Billow D, PA-C  pioglitazone (ACTOS) 15 MG tablet Take 15 mg by mouth daily.    [provider]  predniSONE (DELTASONE) 10 MG tablet Take 10 mg by mouth daily with breakfast.    [provider]  promethazine (PHENERGAN) 25 MG suppository Place 1 suppository (25 mg total) rectally every 8 (eight) hours as needed for nausea or vomiting. 09/25/19   Dreshawn Hendershott, Wenda Overland, MD  propranolol ER (INDERAL LA) 120 MG 24 hr capsule Take 120 mg by mouth daily.    [provider]    Family History Family History  Problem Relation Age of Onset  . Heart attack Father   . Heart disease Mother   . Hypertension Mother   . Breast cancer Sister   . Diabetes Brother   . Colon cancer Neg Hx   . Esophageal cancer Neg Hx   . Liver cancer Neg Hx   . Pancreatic cancer Neg Hx   . Rectal cancer Neg Hx   . Stomach cancer Neg Hx     Social History Social History   Tobacco Use  . Smoking status: Former Smoker    Packs/day: 1.00    Years: 40.00    Pack years: 40.00    Types: Cigarettes    Quit date: 12/08/2017    Years since quitting: 1.7  . Smokeless tobacco: Never Used  Substance Use Topics  . Alcohol use: No  . Drug use: No     Allergies   Oxycodone, Remicade [infliximab], Adhesive [tape], and Latex   Review of Systems Review of Systems  Gastrointestinal: Positive for vomiting.   All other systems reviewed and are negative except that which was mentioned in HPI   Physical Exam Updated Vital Signs BP (!) 123/95 (BP Location: Right Arm)   Pulse 90   Temp 98.5 F (36.9 C) (Oral)   Resp 18   Ht 5\' 6"  (1.676 m)   Wt 81.6  kg   SpO2 99%   BMI 29.05 kg/m   Physical Exam Vitals signs and nursing note reviewed.  Constitutional:      General: She is not in acute distress.    Appearance: She is well-developed.     Comments: Anxious, uncomfortable, holding empty emesis bag  HENT:     Head: Normocephalic and atraumatic.  Eyes:     Conjunctiva/sclera: Conjunctivae normal.  Neck:  Musculoskeletal: Neck supple.  Cardiovascular:     Rate and Rhythm: Normal rate and regular rhythm.     Heart sounds: Normal heart sounds. No murmur.  Pulmonary:     Effort: Pulmonary effort is normal.     Breath sounds: Normal breath sounds.  Abdominal:     General: Bowel sounds are normal. There is no distension.     Palpations: Abdomen is soft.     Tenderness: There is no abdominal tenderness.  Skin:    General: Skin is warm and dry.  Neurological:     Mental Status: She is alert and oriented to person, place, and time.     Comments: Fluent speech  Psychiatric:        Judgment: Judgment normal.      ED Treatments / Results  Labs (all labs ordered are listed, but only abnormal results are displayed) Labs Reviewed  COMPREHENSIVE METABOLIC PANEL - Abnormal; Notable for the following components:      Result Value   Glucose, Bld 164 (*)    Calcium 8.5 (*)    Total Protein 6.1 (*)    AST 46 (*)    All other components within normal limits  LIPASE, BLOOD  CBC WITH DIFFERENTIAL/PLATELET    EKG None  Radiology No results found.  Procedures Procedures (including critical care time)  Medications Ordered in ED Medications  metoCLOPramide (REGLAN) injection 10 mg (10 mg Intravenous Given 09/25/19 1048)  sodium chloride 0.9 % bolus 1,000 mL (0 mLs Intravenous Stopped 09/25/19 1218)  LORazepam (ATIVAN) injection 1 mg (1 mg Intravenous Given 09/25/19 1109)     Initial Impression / Assessment and Plan / ED Course  I have reviewed the triage vital signs and the nursing notes.  Pertinent labs & imaging  results that were available during my care of the patient were reviewed by me and considered in my medical decision making (see chart for details).       Pt uncomfortable but no abd tenderness on exam. Doubt bowel obstruction based on chronicity of symptoms and no pain. VS reassuring. Basic labs unremarkable including normal LFTs and lipase. Gave reglan and later ativan for nausea.  Labwork unremarkable. Patient later able to tolerate liquids and stated she felt better on reassessment. I have recommended that she follow closely with her gastroenterologist and PCP regarding her ongoing symptoms and stool study results. Provided with PR Phenergan to use as needed if Zofran fails to control her symptoms at home. I have reviewed return precautions and she voiced understanding.  Final Clinical Impressions(s) / ED Diagnoses   Final diagnoses:  Nausea    ED Discharge Orders         Ordered    promethazine (PHENERGAN) 25 MG suppository  Every 8 hours PRN     09/25/19 1225           Ameshia Pewitt, Wenda Overland, MD 09/25/19 1228

## 2019-09-25 NOTE — ED Notes (Signed)
Given PO ginger ale. Tolerating well.

## 2019-09-29 ENCOUNTER — Telehealth: Payer: Self-pay | Admitting: Gastroenterology

## 2019-09-29 ENCOUNTER — Encounter: Payer: Self-pay | Admitting: Gastroenterology

## 2019-09-29 DIAGNOSIS — R110 Nausea: Secondary | ICD-10-CM | POA: Insufficient documentation

## 2019-09-29 DIAGNOSIS — K529 Noninfective gastroenteritis and colitis, unspecified: Secondary | ICD-10-CM | POA: Insufficient documentation

## 2019-09-29 NOTE — Progress Notes (Signed)
Reviewed and agree with management plan.  Bonney Berres T. Eathen Budreau, MD FACG [facility]  

## 2019-09-29 NOTE — Telephone Encounter (Signed)
Labs all look good from her ED visit.  Stool studies from her PCP were normal/negative.  Nothing stood out in their labs either.  I am sorry that she is still not feeling well.  I increased her PPI to 40 mg twice daily, but did not expect that to really start helping immediately in regards to the nausea, etc.  Has she tried taking her Questran twice a day with applesauce for her diarrhea?  Her diarrhea is chronic.  The nausea was the new issue.  Looks like it is the nausea that took her to the emergency department and has been more of her driving issue recently.  Has the Zofran or Phenergan that they gave her in the emergency department seem to help at all?  I am really not quite sure what is causing the nausea.  Please confirm, but I do not think she had any abdominal pain.  She described a raw sensation in her abdomen, but no pain per se.  Let me know more after you talk to her.  Thank you,  Jess

## 2019-09-29 NOTE — Telephone Encounter (Signed)
Thank you.  I am not sure what else to do for her symptoms at this point.  Lets get a CT scan of the abdomen and pelvis with contrast to rule out any other issues.

## 2019-09-29 NOTE — Telephone Encounter (Signed)
The pt states that she has been taking her PPI BID and Questran BID.  She does agree that the nausea is the main issue at this time.  She says that zofran did not help but the phenergan suppository did help a little.  No abd pain, just the rawness you described.

## 2019-09-29 NOTE — Telephone Encounter (Signed)
Pt was at the ED on 10/24 for nausea and diarrhea. She was told to f/u with [place] this week but first available with APP is on 11/6. Pt is requesting something this week. Pls call her.

## 2019-09-29 NOTE — Telephone Encounter (Signed)
Jessica [name] the pt is calling and would like for you to review ED notes from 10/24 and make recommendations.

## 2019-09-30 NOTE — Telephone Encounter (Signed)
Left message on machine to call back  

## 2019-10-01 NOTE — Telephone Encounter (Signed)
Left message on machine to call back  

## 2019-10-04 NOTE — Telephone Encounter (Signed)
I spoke with the pt and she tells me she saw her PCP and he prescribed Reglan and she feels much better now.  She will call our office if she has further concerns.

## 2019-10-04 NOTE — Telephone Encounter (Signed)
Ok.  Please save this to the chart.  Thank you,  Jess

## 2019-11-10 DIAGNOSIS — E559 Vitamin D deficiency, unspecified: Secondary | ICD-10-CM | POA: Diagnosis not present

## 2019-11-10 DIAGNOSIS — M0609 Rheumatoid arthritis without rheumatoid factor, multiple sites: Secondary | ICD-10-CM | POA: Diagnosis not present

## 2019-11-10 DIAGNOSIS — M545 Low back pain: Secondary | ICD-10-CM | POA: Diagnosis not present

## 2019-11-10 DIAGNOSIS — R7612 Nonspecific reaction to cell mediated immunity measurement of gamma interferon antigen response without active tuberculosis: Secondary | ICD-10-CM | POA: Diagnosis not present

## 2019-11-10 DIAGNOSIS — M542 Cervicalgia: Secondary | ICD-10-CM | POA: Diagnosis not present

## 2019-11-10 DIAGNOSIS — M25562 Pain in left knee: Secondary | ICD-10-CM | POA: Diagnosis not present

## 2019-11-10 DIAGNOSIS — M7989 Other specified soft tissue disorders: Secondary | ICD-10-CM | POA: Diagnosis not present

## 2019-11-10 DIAGNOSIS — G629 Polyneuropathy, unspecified: Secondary | ICD-10-CM | POA: Diagnosis not present

## 2019-11-13 ENCOUNTER — Other Ambulatory Visit: Payer: Self-pay | Admitting: Physician Assistant

## 2020-01-24 DIAGNOSIS — R350 Frequency of micturition: Secondary | ICD-10-CM | POA: Diagnosis not present

## 2020-01-24 DIAGNOSIS — F321 Major depressive disorder, single episode, moderate: Secondary | ICD-10-CM | POA: Diagnosis not present

## 2020-01-24 DIAGNOSIS — R630 Anorexia: Secondary | ICD-10-CM | POA: Diagnosis not present

## 2020-01-24 DIAGNOSIS — E118 Type 2 diabetes mellitus with unspecified complications: Secondary | ICD-10-CM | POA: Diagnosis not present

## 2020-01-24 DIAGNOSIS — E039 Hypothyroidism, unspecified: Secondary | ICD-10-CM | POA: Diagnosis not present

## 2020-01-24 DIAGNOSIS — R5383 Other fatigue: Secondary | ICD-10-CM | POA: Diagnosis not present

## 2020-01-24 DIAGNOSIS — R42 Dizziness and giddiness: Secondary | ICD-10-CM | POA: Diagnosis not present

## 2020-01-24 DIAGNOSIS — F332 Major depressive disorder, recurrent severe without psychotic features: Secondary | ICD-10-CM | POA: Diagnosis not present

## 2020-01-24 DIAGNOSIS — K7689 Other specified diseases of liver: Secondary | ICD-10-CM | POA: Diagnosis not present

## 2020-01-24 DIAGNOSIS — I10 Essential (primary) hypertension: Secondary | ICD-10-CM | POA: Diagnosis not present

## 2020-01-24 DIAGNOSIS — E7800 Pure hypercholesterolemia, unspecified: Secondary | ICD-10-CM | POA: Diagnosis not present

## 2020-01-27 DIAGNOSIS — E039 Hypothyroidism, unspecified: Secondary | ICD-10-CM | POA: Diagnosis not present

## 2020-01-27 DIAGNOSIS — R7989 Other specified abnormal findings of blood chemistry: Secondary | ICD-10-CM | POA: Diagnosis not present

## 2020-01-27 DIAGNOSIS — R531 Weakness: Secondary | ICD-10-CM | POA: Diagnosis not present

## 2020-01-28 NOTE — Progress Notes (Signed)
Patient referred by Jani Gravel, MD for  exertional dyspnea, dizziness.  Subjective:   Jessica [name], female    DOB: 12-May-1951, 69 y.o.   MRN: [id number]   Chief Complaint  Patient presents with  . Loss of Consciousness  . Congestive Heart Failure  . New Patient (Initial Visit)    Family Hx of MI/CAD     HPI  69 y.o. Caucasian female, former smoker, family h/o CAD, now with exertional dyspnea, dizziness.  She used to be active, walking with her girlfriends before [place]. Since COVID, her physical activity has significantly reduced. She complains of exertional dyspnea with walking inside the house. She reports feeling lightheaded, usually with standing. She denies any chest pain.   She has significant family h/o CAD in her mother and brother.   Past Medical History:  Diagnosis Date  . Allergy   . Anxiety   . Asthma   . Cervical stenosis of spine   . COPD (chronic obstructive pulmonary disease) ([place])   . DDD (degenerative disc disease)   . Depression   . Diabetes mellitus without complication ([place])   . Fatty liver   . Gastric ulcer   . GERD (gastroesophageal reflux disease)   . Heart murmur   . Hyperglycemia   . Hyperlipidemia   . Hypothyroidism   . Irregular heartbeat   . Migraines   . Multiple gastric ulcers   . Obesity   . Osteoarthritis   . Rheumatoid arthritis ([place])   . Sciatica   . Scoliosis   . Vitamin D deficiency      Past Surgical History:  Procedure Laterality Date  . ANTERIOR CERVICAL DECOMP/DISCECTOMY FUSION  12/08/2017   Procedure: Anterior Cervical Decompression/Discectomy Fusion - Cervical four-Cervical five - Cervical five-Cervical six - Cervical six-Cervical seven;  Surgeon: Earnie Larsson, MD;  Location: [facility];  Service: Neurosurgery;;  . APPENDECTOMY    . CHOLECYSTECTOMY  2010  . COLONOSCOPY    . COLOSTOMY     removed 3 inches of colon 1987  . 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 Perforated Colon   1986   by Dr. Margot Chimes  . POLYPECTOMY    . TOTAL ABDOMINAL HYSTERECTOMY  1989   Dr. Nori Riis  . UPPER GASTROINTESTINAL ENDOSCOPY    . UPPER GI ENDOSCOPY       Social History   Tobacco Use  Smoking Status Former Smoker  . Packs/day: 1.00  . Years: 40.00  . Pack years: 40.00  . Types: Cigarettes  . Quit date: 12/08/2017  . Years since quitting: 2.1  Smokeless Tobacco Never Used    Social History   Substance and Sexual Activity  Alcohol Use No     Family History  Problem Relation Age of Onset  . Heart attack Father   . Heart disease Mother   . Hypertension Mother   . Breast cancer Sister   . Diabetes Brother   . Colon cancer Neg Hx   . Esophageal cancer Neg Hx   . Liver cancer Neg Hx   . Pancreatic cancer Neg Hx   . Rectal cancer Neg Hx   . Stomach cancer Neg Hx      Current Outpatient Medications on File Prior to Visit  Medication Sig Dispense Refill  . ALPRAZolam (XANAX) 0.5 MG tablet Take 0.5 mg by mouth at bedtime.    . butalbital-acetaminophen-caffeine (FIORICET, ESGIC) 50-325-40 MG per tablet Take 1 tablet by mouth 3 (three)  times daily as needed for headache or migraine.     . cholestyramine (QUESTRAN) 4 g packet DISSOLVE & TAKE 1 POWDER BY MOUTH TWICE DAILY 60 each 0  . cyclobenzaprine (FLEXERIL) 10 MG tablet Take 1 tablet (10 mg total) by mouth 3 (three) times daily as needed for muscle spasms. 30 tablet 0  . ergocalciferol (VITAMIN D2) 50000 UNITS capsule Take 50,000 Units by mouth every Wednesday.     . ezetimibe-simvastatin (VYTORIN) 10-40 MG tablet Take 1 tablet by mouth daily.    Marland Kitchen gabapentin (NEURONTIN) 300 MG capsule Take 300 mg by mouth 2 (two) times daily as needed (nerve pain).     Marland Kitchen glimepiride (AMARYL) 4 MG tablet Take 4 mg by mouth daily with breakfast.    . glycopyrrolate (ROBINUL) 2 MG tablet Take 2 mg by mouth 2 (two) times daily.    Marland Kitchen levothyroxine (SYNTHROID, LEVOTHROID) 137 MCG tablet Take 137 mcg by mouth daily before breakfast.    .  Multiple Vitamin (MULTIVITAMIN) tablet Take 1 tablet by mouth daily.    Marland Kitchen omeprazole (PRILOSEC) 40 MG capsule Take 1 capsule (40 mg total) by mouth 2 (two) times daily. 60 capsule 2  . ondansetron (ZOFRAN) 4 MG tablet Take 1 tablet (4 mg total) by mouth every 6 (six) hours as needed for nausea or vomiting. 30 tablet 1  . pioglitazone (ACTOS) 15 MG tablet Take 15 mg by mouth daily.    . predniSONE (DELTASONE) 10 MG tablet Take 10 mg by mouth daily with breakfast.    . promethazine (PHENERGAN) 25 MG suppository Place 1 suppository (25 mg total) rectally every 8 (eight) hours as needed for nausea or vomiting. 6 each 0  . propranolol ER (INDERAL LA) 120 MG 24 hr capsule Take 120 mg by mouth daily.     Current Facility-Administered Medications on File Prior to Visit  Medication Dose Route Frequency Provider Last Rate Last Admin  . 0.9 %  sodium chloride infusion  500 mL Intravenous Once Ladene Artist, MD        Cardiovascular and other pertinent studies:  EKG 01/31/2020: Sinus rhythm 74 bpm. Anteroseptal T wave inversion, cannot exclude ischemia.   Recent labs: 01/24/2020: Glucose 87, BUN/Cr 19/0.87. EGFR >=60. Na/K 139/5.0. Rest of the CMP normal H/H 15.1/43.4. MCV 94.8. Platelets 227 HbA1C 5.7 %  Review of Systems  Cardiovascular: Positive for dyspnea on exertion. Negative for chest pain, leg swelling, palpitations and syncope.  Neurological: Positive for light-headedness.         Vitals:   01/31/20 1343  Resp: 16  Temp: (!) 97.2 F (36.2 C)  SpO2: 95%   Orthostatic VS for the past 72 hrs (Last 3 readings):  Orthostatic BP Patient Position BP Location Cuff Size Orthostatic Pulse  01/31/20 1346 126/57 -- -- -- 68  01/31/20 1345 132/66 Sitting Left Arm Large 78  01/31/20 1343 133/68 Supine Left Arm Large 62      Body mass index is 29.7 kg/m. Filed Weights   01/31/20 1343  Weight: 184 lb (83.5 kg)     Objective:   Physical Exam  Constitutional: She appears  well-developed and well-nourished.  Neck: No JVD present.  Cardiovascular: Normal rate, regular rhythm, normal heart sounds and intact distal pulses.  No murmur heard. Pulmonary/Chest: Effort normal and breath sounds normal. She has no wheezes. She has no rales.  Musculoskeletal:        General: No edema.  Nursing note and vitals reviewed.  Assessment & Recommendations:   69 y.o. Caucasian female, former smoker, family h/o CAD, now with exertional dyspnea, dizziness.  Dizziness: Orthostatics are negative today. Nonspecific complaints. Possible vasovagal. Encourage hydration and compression stockings.   Exertional dyspnea: Unremarkable exam. Will obtain echocardiogram.  Most likely, symptoms are related to deconditioning.  Risk stratification: Given her family history of CAD, will obtain lipid panel and calcium score scan.   Thank you for referring the patient to [place]. Please feel free to contact with any questions.  Nigel Mormon, MD [facility] Pager: [phone] Office: [phone]

## 2020-01-29 ENCOUNTER — Other Ambulatory Visit: Payer: Self-pay | Admitting: Physician Assistant

## 2020-01-31 ENCOUNTER — Ambulatory Visit: Payer: Medicare Other | Admitting: Cardiology

## 2020-01-31 ENCOUNTER — Encounter: Payer: Self-pay | Admitting: Cardiology

## 2020-01-31 ENCOUNTER — Other Ambulatory Visit: Payer: Self-pay

## 2020-01-31 VITALS — Temp 97.2°F | Resp 16 | Ht 66.0 in | Wt 184.0 lb

## 2020-01-31 DIAGNOSIS — R9431 Abnormal electrocardiogram [ECG] [EKG]: Secondary | ICD-10-CM | POA: Insufficient documentation

## 2020-01-31 DIAGNOSIS — R42 Dizziness and giddiness: Secondary | ICD-10-CM | POA: Diagnosis not present

## 2020-01-31 DIAGNOSIS — R55 Syncope and collapse: Secondary | ICD-10-CM | POA: Insufficient documentation

## 2020-01-31 DIAGNOSIS — Z8249 Family history of ischemic heart disease and other diseases of the circulatory system: Secondary | ICD-10-CM | POA: Diagnosis not present

## 2020-02-02 ENCOUNTER — Telehealth: Payer: Self-pay | Admitting: Cardiology

## 2020-02-02 DIAGNOSIS — R0600 Dyspnea, unspecified: Secondary | ICD-10-CM

## 2020-02-02 DIAGNOSIS — R931 Abnormal findings on diagnostic imaging of heart and coronary circulation: Secondary | ICD-10-CM

## 2020-02-02 DIAGNOSIS — R0609 Other forms of dyspnea: Secondary | ICD-10-CM

## 2020-02-02 MED ORDER — METOPROLOL TARTRATE 25 MG PO TABS: 25.0000 mg | ORAL_TABLET | Freq: Two times a day (BID) | ORAL | 3 refills | Status: DC

## 2020-02-02 MED ORDER — ASPIRIN EC 81 MG PO TBEC: 81.0000 mg | DELAYED_RELEASE_TABLET | Freq: Every day | ORAL | 3 refills | Status: DC

## 2020-02-02 MED ORDER — ATORVASTATIN CALCIUM 40 MG PO TABS: 20.0000 mg | ORAL_TABLET | Freq: Every day | ORAL | 2 refills | Status: DC

## 2020-02-02 NOTE — Telephone Encounter (Signed)
CT Cardiac scoring 02/01/2020: 1. Calcium score of 868. This is between the 90th and 100 percentile for females between the ages of 47 and 56. This is consistent with extensive atherosclerotic plaque. There is high likelihood of at least one significant coronary artery narrowing.  Recommend atorvastatin 40 mg, ASA 81 mg.  Currently taking propranolol 120 mg. Will switch to metoprolol tartarate 25 mg bid.   Recommend lexiscan nuclear stress test. Abnormal baseline EKG.   Follow up after that.  Nigel Mormon, MD [facility] Pager: [phone] Office: [phone]

## 2020-02-09 ENCOUNTER — Other Ambulatory Visit: Payer: Self-pay

## 2020-02-09 ENCOUNTER — Ambulatory Visit: Payer: Medicare Other

## 2020-02-09 DIAGNOSIS — R0609 Other forms of dyspnea: Secondary | ICD-10-CM | POA: Diagnosis not present

## 2020-02-09 DIAGNOSIS — R0600 Dyspnea, unspecified: Secondary | ICD-10-CM

## 2020-02-10 ENCOUNTER — Ambulatory Visit: Payer: Medicare Other

## 2020-02-10 ENCOUNTER — Other Ambulatory Visit: Payer: Self-pay | Admitting: Cardiology

## 2020-02-10 ENCOUNTER — Ambulatory Visit: Payer: Medicare Other | Admitting: Cardiology

## 2020-02-10 ENCOUNTER — Encounter: Payer: Self-pay | Admitting: Cardiology

## 2020-02-10 VITALS — BP 125/85 | HR 52 | Temp 98.0°F | Ht 66.0 in | Wt 182.0 lb

## 2020-02-10 DIAGNOSIS — R931 Abnormal findings on diagnostic imaging of heart and coronary circulation: Secondary | ICD-10-CM | POA: Diagnosis not present

## 2020-02-10 DIAGNOSIS — R9439 Abnormal result of other cardiovascular function study: Secondary | ICD-10-CM

## 2020-02-10 DIAGNOSIS — R0600 Dyspnea, unspecified: Secondary | ICD-10-CM

## 2020-02-10 DIAGNOSIS — R9431 Abnormal electrocardiogram [ECG] [EKG]: Secondary | ICD-10-CM

## 2020-02-10 DIAGNOSIS — I2510 Atherosclerotic heart disease of native coronary artery without angina pectoris: Secondary | ICD-10-CM | POA: Insufficient documentation

## 2020-02-10 DIAGNOSIS — R0609 Other forms of dyspnea: Secondary | ICD-10-CM | POA: Insufficient documentation

## 2020-02-10 DIAGNOSIS — R42 Dizziness and giddiness: Secondary | ICD-10-CM

## 2020-02-10 DIAGNOSIS — Z8249 Family history of ischemic heart disease and other diseases of the circulatory system: Secondary | ICD-10-CM

## 2020-02-10 DIAGNOSIS — I25118 Atherosclerotic heart disease of native coronary artery with other forms of angina pectoris: Secondary | ICD-10-CM

## 2020-02-10 DIAGNOSIS — I498 Other specified cardiac arrhythmias: Secondary | ICD-10-CM

## 2020-02-10 NOTE — Progress Notes (Signed)
Patient referred by Jani Gravel, MD for  exertional dyspnea, dizziness.  Subjective:   Jessica [name], female    DOB: October 28, 1951, 69 y.o.   MRN: [id number]   Chief Complaint  Patient presents with  . Shortness of Breath  . Follow-up    69 y.o. Caucasian female, former smoker, family h/o CAD, elevated calcium score with exertional dyspnea, dizziness.  Work-up so far showed significantly elevated calcium score, mild ischemia on stress test with mild reduced stress LVEF. EF was normal on echocardiogram.  Patient was getting her echocardiogram today, and was found to be profoundly short of breath.  Thus, urgent follow-up was arranged.   After reviewing her calcium score scan, I had started on aspirin 81 mg, atorvastatin 40 mg, and changed to propranolol 120 mg to metoprolol tartrate 25 mg twice daily.  Patient is in ventricualr bigeminy today.  Initiated consultation HPI 02/10/2020: She used to be active, walking with her girlfriends before [place]. Since COVID, her physical activity has significantly reduced. She complains of exertional dyspnea with walking inside the house. She reports feeling lightheaded, usually with standing. She denies any chest pain.   She has significant family h/o CAD in her mother and brother.    Current Outpatient Medications on File Prior to Visit  Medication Sig Dispense Refill  . ALPRAZolam (XANAX) 0.5 MG tablet Take 0.5 mg by mouth at bedtime.    Marland Kitchen aspirin EC 81 MG tablet Take 1 tablet (81 mg total) by mouth daily. 90 tablet 3  . atorvastatin (LIPITOR) 40 MG tablet Take 0.5 tablets (20 mg total) by mouth daily. 30 tablet 2  . butalbital-acetaminophen-caffeine (FIORICET, ESGIC) 50-325-40 MG per tablet Take 1 tablet by mouth 3 (three) times daily as needed for headache or migraine.     . calcium carbonate (OS-CAL) 1250 (500 Ca) MG chewable tablet Chew 1 tablet by mouth daily.    . cholestyramine (QUESTRAN) 4 g packet DISSOLVE & TAKE 1 POWDER BY MOUTH TWICE  DAILY 60 each 2  . dicyclomine (BENTYL) 20 MG tablet Take 20 mg by mouth 2 (two) times daily.    . ergocalciferol (VITAMIN D2) 50000 UNITS capsule Take 50,000 Units by mouth every Wednesday.     . estradiol (ESTRACE) 2 MG tablet Take 2 mg by mouth daily.    Marland Kitchen gabapentin (NEURONTIN) 300 MG capsule Take 300 mg by mouth 2 (two) times daily as needed (nerve pain).     Marland Kitchen glimepiride (AMARYL) 4 MG tablet Take 4 mg by mouth daily with breakfast.    . glycopyrrolate (ROBINUL) 2 MG tablet Take 2 mg by mouth 2 (two) times daily.    Marland Kitchen HYDROcodone-acetaminophen (NORCO/VICODIN) 5-325 MG tablet     . levothyroxine (SYNTHROID, LEVOTHROID) 137 MCG tablet Take 175 mcg by mouth daily before breakfast.     . metoCLOPramide (REGLAN) 5 MG tablet Take 5 mg by mouth 4 (four) times daily.    . metoprolol tartrate (LOPRESSOR) 25 MG tablet Take 1 tablet (25 mg total) by mouth 2 (two) times daily. 60 tablet 3  . Multiple Vitamin (MULTIVITAMIN) tablet Take 1 tablet by mouth daily.    Marland Kitchen omeprazole (PRILOSEC) 40 MG capsule Take 1 capsule (40 mg total) by mouth 2 (two) times daily. 60 capsule 2  . ondansetron (ZOFRAN) 4 MG tablet Take 1 tablet (4 mg total) by mouth every 6 (six) hours as needed for nausea or vomiting. 30 tablet 1  . pioglitazone (ACTOS) 15 MG tablet Take 15 mg by  mouth daily.    . Potassium Chloride ER 20 MEQ TBCR Take 1 tablet by mouth 2 (two) times daily.    . predniSONE (DELTASONE) 10 MG tablet Take 10 mg by mouth daily with breakfast.    . promethazine (PHENERGAN) 25 MG suppository Place 1 suppository (25 mg total) rectally every 8 (eight) hours as needed for nausea or vomiting. 6 each 0  . promethazine (PHENERGAN) 25 MG tablet Take 25 mg by mouth 3 (three) times daily as needed.     Current Facility-Administered Medications on File Prior to Visit  Medication Dose Route Frequency Provider Last Rate Last Admin  . 0.9 %  sodium chloride infusion  500 mL Intravenous Once Ladene Artist, MD         Cardiovascular and other pertinent studies:  Echocardiogram 02/10/2020: Left ventricle cavity is normal in size. Moderate concentric hypertrophy of the left ventricle. Normal global wall motion. Normal LV systolic function with visual EF 55-60%. Diastolic function not assessed due to ventricular bigeminy.  Left atrial cavity is mildly dilated. Mild to moderate mitral regurgitation. IVC is dilated with respiratory variation. Estimated RA pressure 8 mmHg.  Lexiscan Tetrofosmin stress test 02/09/2020: Stress EKG is non-diagnostic, as this is pharmacological stress test. Rest and stress EKG show sinus rhythm with ventricular bigeminy/trigeminy. Small size, mild intensity, reversible perfusion defect in basal anteroseptal myocardium. Stress LVEF 49%. Intermediate risk stress test.  CT Cardiac scoring 02/01/2020: 1. Calcium score of 868. This is between the 90th and 100 percentile for females between the ages of 44 and 36. This is consistent with extensive atherosclerotic plaque. There is high likelihood of at least one significant coronary artery narrowing.  EKG 01/31/2020: Sinus rhythm 74 bpm. Anteroseptal T wave inversion, cannot exclude ischemia.   Recent labs: 01/24/2020: Glucose 87, BUN/Cr 19/0.87. EGFR >=60. Na/K 139/5.0. Rest of the CMP normal H/H 15.1/43.4. MCV 94.8. Platelets 227 HbA1C 5.7 %  Review of Systems  Cardiovascular: Positive for dyspnea on exertion. Negative for chest pain, leg swelling, palpitations and syncope.  Respiratory: Positive for shortness of breath.   Neurological: Positive for light-headedness.         Vitals:   02/10/20 1514  BP: 125/85  Pulse: (!) 52  Temp: 98 F (36.7 C)  SpO2: 98%     Body mass index is 29.38 kg/m. Filed Weights   02/10/20 1514  Weight: 182 lb (82.6 kg)     Objective:   Physical Exam  Constitutional: She appears well-developed and well-nourished.  Neck: No JVD present.  Cardiovascular: Normal rate, regular  rhythm, normal heart sounds and intact distal pulses.  No murmur heard. Pulmonary/Chest: Effort normal and breath sounds normal. She has no wheezes. She has no rales.  Musculoskeletal:        General: No edema.  Nursing note and vitals reviewed.       Assessment & Recommendations:   68 y.o. Caucasian female, former smoker, family h/o CAD, elevated calcium score with exertional dyspnea, dizziness.  Exertional dyspnea, dizziness: Suspect angina equivalent in the setting of elevated calcium score and mild anteroseptal ischemia. Dizziness could be due to ventricular bigeminy. Intermediate risk stress test with stress LVEF 49%. Need to exclude ischemia as etiology. Recommend coronary angiogram and possible intervention. If no obstructive CAD found, she my need consideration for PVC ablation. She will be wearing 24 hr Holter monitor today.   Continue Aspirin 81 mg. atorvastatin 40 mg, metoprolol tartarate 25 mg bid.  Risks/benefits/alternate options explained to the patient and husband.  Patient was quite emotional with initial discussion, but was able to understand  Nigel Mormon, MD [facility] Pager: [phone] Office: [phone]

## 2020-02-11 ENCOUNTER — Other Ambulatory Visit (HOSPITAL_COMMUNITY)
Admission: RE | Admit: 2020-02-11 | Discharge: 2020-02-11 | Disposition: A | Discharge status: Home / Self Care | Payer: Medicare Other | Source: Ambulatory Visit | Attending: Cardiology | Admitting: Cardiology

## 2020-02-11 DIAGNOSIS — Z20822 Contact with and (suspected) exposure to covid-19: Secondary | ICD-10-CM | POA: Diagnosis not present

## 2020-02-11 DIAGNOSIS — Z01812 Encounter for preprocedural laboratory examination: Secondary | ICD-10-CM | POA: Diagnosis not present

## 2020-02-11 DIAGNOSIS — R42 Dizziness and giddiness: Secondary | ICD-10-CM | POA: Diagnosis not present

## 2020-02-12 LAB — SARS CORONAVIRUS 2 (TAT 6-24 HRS): SARS Coronavirus 2: NEGATIVE

## 2020-02-15 ENCOUNTER — Ambulatory Visit (HOSPITAL_COMMUNITY)
Admission: RE | Admit: 2020-02-15 | Discharge: 2020-02-15 | Disposition: A | Discharge status: Home / Self Care | Payer: Medicare Other | Attending: Cardiology | Admitting: Cardiology

## 2020-02-15 ENCOUNTER — Other Ambulatory Visit: Payer: Self-pay | Admitting: Cardiology

## 2020-02-15 ENCOUNTER — Encounter (HOSPITAL_COMMUNITY)
Admission: RE | Disposition: A | Discharge status: Home / Self Care | Payer: Self-pay | Source: Home / Self Care | Attending: Cardiology

## 2020-02-15 ENCOUNTER — Other Ambulatory Visit: Payer: Self-pay

## 2020-02-15 DIAGNOSIS — R0609 Other forms of dyspnea: Secondary | ICD-10-CM | POA: Diagnosis present

## 2020-02-15 DIAGNOSIS — R931 Abnormal findings on diagnostic imaging of heart and coronary circulation: Secondary | ICD-10-CM | POA: Diagnosis not present

## 2020-02-15 DIAGNOSIS — Z8249 Family history of ischemic heart disease and other diseases of the circulatory system: Secondary | ICD-10-CM | POA: Diagnosis not present

## 2020-02-15 DIAGNOSIS — R9439 Abnormal result of other cardiovascular function study: Secondary | ICD-10-CM | POA: Diagnosis not present

## 2020-02-15 DIAGNOSIS — Z87891 Personal history of nicotine dependence: Secondary | ICD-10-CM | POA: Diagnosis not present

## 2020-02-15 DIAGNOSIS — R42 Dizziness and giddiness: Secondary | ICD-10-CM | POA: Diagnosis present

## 2020-02-15 DIAGNOSIS — Z79890 Hormone replacement therapy (postmenopausal): Secondary | ICD-10-CM | POA: Diagnosis not present

## 2020-02-15 DIAGNOSIS — I25118 Atherosclerotic heart disease of native coronary artery with other forms of angina pectoris: Secondary | ICD-10-CM | POA: Insufficient documentation

## 2020-02-15 DIAGNOSIS — I2510 Atherosclerotic heart disease of native coronary artery without angina pectoris: Secondary | ICD-10-CM | POA: Diagnosis present

## 2020-02-15 DIAGNOSIS — Z7982 Long term (current) use of aspirin: Secondary | ICD-10-CM | POA: Diagnosis not present

## 2020-02-15 DIAGNOSIS — Z79899 Other long term (current) drug therapy: Secondary | ICD-10-CM | POA: Diagnosis not present

## 2020-02-15 DIAGNOSIS — Z7984 Long term (current) use of oral hypoglycemic drugs: Secondary | ICD-10-CM | POA: Insufficient documentation

## 2020-02-15 DIAGNOSIS — R0600 Dyspnea, unspecified: Secondary | ICD-10-CM

## 2020-02-15 HISTORY — PX: LEFT HEART CATH AND CORONARY ANGIOGRAPHY: CATH118249

## 2020-02-15 LAB — CBC
HCT: 38.5 % (ref 36.0–46.0)
Hemoglobin: 13 g/dL (ref 12.0–15.0)
MCH: 33.3 pg (ref 26.0–34.0)
MCHC: 33.8 g/dL (ref 30.0–36.0)
MCV: 98.7 fL (ref 80.0–100.0)
Platelets: 185 10*3/uL (ref 150–400)
RBC: 3.9 MIL/uL (ref 3.87–5.11)
RDW: 12 % (ref 11.5–15.5)
WBC: 7.3 10*3/uL (ref 4.0–10.5)
nRBC: 0 % (ref 0.0–0.2)

## 2020-02-15 LAB — BASIC METABOLIC PANEL
Anion gap: 13 (ref 5–15)
BUN: 13 mg/dL (ref 8–23)
CO2: 23 mmol/L (ref 22–32)
Calcium: 8.8 mg/dL — ABNORMAL LOW (ref 8.9–10.3)
Chloride: 105 mmol/L (ref 98–111)
Creatinine, Ser: 0.7 mg/dL (ref 0.44–1.00)
GFR calc Af Amer: >60 mL/min (ref >60)
GFR calc non Af Amer: >60 mL/min (ref >60)
Glucose, Bld: 102 mg/dL — ABNORMAL HIGH (ref 70–99)
Potassium: 3.7 mmol/L (ref 3.5–5.1)
Sodium: 141 mmol/L (ref 135–145)

## 2020-02-15 LAB — GLUCOSE, CAPILLARY
Glucose-Capillary: 71 mg/dL (ref 70–99)
Glucose-Capillary: 88 mg/dL (ref 70–99)

## 2020-02-15 LAB — POCT ACTIVATED CLOTTING TIME
Activated Clotting Time: 455 seconds
Activated Clotting Time: 186 seconds
Activated Clotting Time: 324 seconds

## 2020-02-15 SURGERY — LEFT HEART CATH AND CORONARY ANGIOGRAPHY
Anesthesia: LOCAL

## 2020-02-15 MED ORDER — CLOPIDOGREL BISULFATE 75 MG PO TABS: 75.0000 mg | ORAL_TABLET | Freq: Every day | ORAL | 2 refills | Status: DC

## 2020-02-15 MED ORDER — SODIUM CHLORIDE 0.9% FLUSH: 3.0000 mL | Freq: Two times a day (BID) | INTRAVENOUS | Status: DC

## 2020-02-15 MED ORDER — FAMOTIDINE IN NACL 20-0.9 MG/50ML-% IV SOLN
INTRAVENOUS | Status: AC
  Filled 2020-02-15: qty 50

## 2020-02-15 MED ORDER — SODIUM CHLORIDE 0.9 % WEIGHT BASED INFUSION: 1.0000 mL/kg/h | INTRAVENOUS | Status: DC

## 2020-02-15 MED ORDER — HEPARIN SODIUM (PORCINE) 1000 UNIT/ML IJ SOLN
INTRAMUSCULAR | Status: AC
  Filled 2020-02-15: qty 1

## 2020-02-15 MED ORDER — BUTALBITAL-APAP-CAFFEINE 50-325-40 MG PO TABS
1.0000 | ORAL_TABLET | Freq: Three times a day (TID) | ORAL | Status: DC | PRN
  Administered 2020-02-15: 1 via ORAL
  Filled 2020-02-15: qty 1

## 2020-02-15 MED ORDER — NITROGLYCERIN 1 MG/10 ML FOR IR/CATH LAB
INTRA_ARTERIAL | Status: AC
  Filled 2020-02-15: qty 10

## 2020-02-15 MED ORDER — SODIUM CHLORIDE 0.9 % IV SOLN: INTRAVENOUS | Status: DC

## 2020-02-15 MED ORDER — FENTANYL CITRATE (PF) 100 MCG/2ML IJ SOLN
INTRAMUSCULAR | Status: AC
  Filled 2020-02-15: qty 2

## 2020-02-15 MED ORDER — HYDRALAZINE HCL 20 MG/ML IJ SOLN: 10.0000 mg | INTRAMUSCULAR | Status: DC | PRN

## 2020-02-15 MED ORDER — MIDAZOLAM HCL 2 MG/2ML IJ SOLN
INTRAMUSCULAR | Status: DC | PRN
  Administered 2020-02-15: 2 mg via INTRAVENOUS
  Administered 2020-02-15 (×2): 1 mg via INTRAVENOUS

## 2020-02-15 MED ORDER — FENTANYL CITRATE (PF) 100 MCG/2ML IJ SOLN
INTRAMUSCULAR | Status: DC | PRN
  Administered 2020-02-15 (×2): 25 ug via INTRAVENOUS
  Administered 2020-02-15 (×2): 50 ug via INTRAVENOUS

## 2020-02-15 MED ORDER — PANTOPRAZOLE SODIUM 40 MG PO TBEC
40.0000 mg | DELAYED_RELEASE_TABLET | Freq: Every day | ORAL | Status: DC
  Administered 2020-02-15: 40 mg via ORAL
  Filled 2020-02-15: qty 1

## 2020-02-15 MED ORDER — VERAPAMIL HCL 2.5 MG/ML IV SOLN
INTRAVENOUS | Status: AC
  Filled 2020-02-15: qty 2

## 2020-02-15 MED ORDER — FAMOTIDINE IN NACL 20-0.9 MG/50ML-% IV SOLN
INTRAVENOUS | Status: AC | PRN
  Administered 2020-02-15: 20 mg via INTRAVENOUS

## 2020-02-15 MED ORDER — SODIUM CHLORIDE 0.9 % WEIGHT BASED INFUSION
3.0000 mL/kg/h | INTRAVENOUS | Status: AC
  Administered 2020-02-15: 3 mL/kg/h via INTRAVENOUS

## 2020-02-15 MED ORDER — CLOPIDOGREL BISULFATE 300 MG PO TABS
ORAL_TABLET | ORAL | Status: DC | PRN
  Administered 2020-02-15: 600 mg via ORAL

## 2020-02-15 MED ORDER — NITROGLYCERIN 1 MG/10 ML FOR IR/CATH LAB
INTRA_ARTERIAL | Status: DC | PRN
  Administered 2020-02-15 (×2): 200 ug via INTRACORONARY

## 2020-02-15 MED ORDER — LIDOCAINE HCL (PF) 1 % IJ SOLN
INTRAMUSCULAR | Status: AC
  Filled 2020-02-15: qty 30

## 2020-02-15 MED ORDER — MIDAZOLAM HCL 2 MG/2ML IJ SOLN
INTRAMUSCULAR | Status: AC
  Filled 2020-02-15: qty 2

## 2020-02-15 MED ORDER — GABAPENTIN 300 MG PO CAPS: 300.0000 mg | ORAL_CAPSULE | Freq: Two times a day (BID) | ORAL | Status: DC | PRN

## 2020-02-15 MED ORDER — SODIUM CHLORIDE 0.9% FLUSH: 3.0000 mL | INTRAVENOUS | Status: DC | PRN

## 2020-02-15 MED ORDER — VERAPAMIL HCL 2.5 MG/ML IV SOLN
INTRAVENOUS | Status: DC | PRN
  Administered 2020-02-15 (×2): 5 mL via INTRA_ARTERIAL
  Administered 2020-02-15: 10 mL via INTRA_ARTERIAL

## 2020-02-15 MED ORDER — ALPRAZOLAM 0.5 MG PO TABS: 0.5000 mg | ORAL_TABLET | Freq: Three times a day (TID) | ORAL | Status: DC | PRN

## 2020-02-15 MED ORDER — HEPARIN (PORCINE) IN NACL 1000-0.9 UT/500ML-% IV SOLN
INTRAVENOUS | Status: DC | PRN
  Administered 2020-02-15 (×3): 500 mL

## 2020-02-15 MED ORDER — CLOPIDOGREL BISULFATE 300 MG PO TABS
ORAL_TABLET | ORAL | Status: AC
  Filled 2020-02-15: qty 1

## 2020-02-15 MED ORDER — DIPHENHYDRAMINE HCL 50 MG/ML IJ SOLN
INTRAMUSCULAR | Status: AC
  Filled 2020-02-15: qty 1

## 2020-02-15 MED ORDER — ATORVASTATIN CALCIUM 40 MG PO TABS: 40.0000 mg | ORAL_TABLET | Freq: Every day | ORAL | 2 refills | Status: DC

## 2020-02-15 MED ORDER — SODIUM CHLORIDE 0.9 % IV SOLN: 250.0000 mL | INTRAVENOUS | Status: DC | PRN

## 2020-02-15 MED ORDER — DIPHENHYDRAMINE HCL 50 MG/ML IJ SOLN
INTRAMUSCULAR | Status: DC | PRN
  Administered 2020-02-15 (×2): 12.5 mg via INTRAVENOUS

## 2020-02-15 MED ORDER — HEPARIN (PORCINE) IN NACL 1000-0.9 UT/500ML-% IV SOLN
INTRAVENOUS | Status: AC
  Filled 2020-02-15: qty 1000

## 2020-02-15 MED ORDER — LABETALOL HCL 5 MG/ML IV SOLN: 10.0000 mg | INTRAVENOUS | Status: DC | PRN

## 2020-02-15 MED ORDER — METOPROLOL TARTRATE 25 MG PO TABS: 25.0000 mg | ORAL_TABLET | Freq: Two times a day (BID) | ORAL | Status: DC

## 2020-02-15 MED ORDER — IOHEXOL 350 MG/ML SOLN
INTRAVENOUS | Status: DC | PRN
  Administered 2020-02-15: 160 mL

## 2020-02-15 MED ORDER — HEPARIN SODIUM (PORCINE) 1000 UNIT/ML IJ SOLN
INTRAMUSCULAR | Status: DC | PRN
  Administered 2020-02-15: 6000 [IU] via INTRAVENOUS
  Administered 2020-02-15 (×2): 4000 [IU] via INTRAVENOUS

## 2020-02-15 MED ORDER — ONDANSETRON HCL 4 MG/2ML IJ SOLN
INTRAMUSCULAR | Status: AC
  Administered 2020-02-15: 4 mg via INTRAVENOUS
  Filled 2020-02-15: qty 2

## 2020-02-15 MED ORDER — NITROGLYCERIN 0.4 MG SL SUBL: 0.4000 mg | SUBLINGUAL_TABLET | SUBLINGUAL | 2 refills | Status: AC | PRN

## 2020-02-15 MED ORDER — CLOPIDOGREL BISULFATE 75 MG PO TABS: 75.0000 mg | ORAL_TABLET | Freq: Every day | ORAL | Status: DC

## 2020-02-15 MED ORDER — ACETAMINOPHEN 325 MG PO TABS: 650.0000 mg | ORAL_TABLET | ORAL | Status: DC | PRN

## 2020-02-15 MED ORDER — LEVOTHYROXINE SODIUM 175 MCG PO TABS: 175.0000 ug | ORAL_TABLET | Freq: Every day | ORAL | Status: DC

## 2020-02-15 MED ORDER — ONDANSETRON HCL 4 MG/2ML IJ SOLN: 4.0000 mg | Freq: Four times a day (QID) | INTRAMUSCULAR | Status: DC | PRN

## 2020-02-15 MED ORDER — AMLODIPINE BESYLATE 2.5 MG PO TABS: 2.5000 mg | ORAL_TABLET | Freq: Every day | ORAL | 2 refills | Status: DC

## 2020-02-15 MED ORDER — LIDOCAINE HCL (PF) 1 % IJ SOLN
INTRAMUSCULAR | Status: DC | PRN
  Administered 2020-02-15: 2 mL

## 2020-02-15 MED ORDER — ASPIRIN 81 MG PO CHEW
81.0000 mg | CHEWABLE_TABLET | ORAL | Status: AC
  Administered 2020-02-15: 81 mg via ORAL
  Filled 2020-02-15: qty 1

## 2020-02-15 MED ORDER — ONE-DAILY MULTI VITAMINS PO TABS: 1.0000 | ORAL_TABLET | Freq: Every day | ORAL | Status: DC

## 2020-02-15 MED FILL — AMLODIPINE BESYLATE 2.5 MG: 2.5 | 30 days supply | Qty: 30 | Fill #0

## 2020-02-15 MED FILL — CLOPIDOGREL 75 MG TABLET: 75 | 30 days supply | Qty: 30 | Fill #0

## 2020-02-15 MED FILL — NITROGLYCERIN 0.4 MG TAB SL: 0.4 | 7 days supply | Qty: 25 | Fill #0

## 2020-02-15 SURGICAL SUPPLY — 26 items
CATH INFINITI 5 FR JL3.5 (CATHETERS) ×1 IMPLANT
CATH INFINITI JR4 5F (CATHETERS) ×1 IMPLANT
CATH LAUNCHER 6FR AL.75 (CATHETERS) ×1 IMPLANT
CATH LAUNCHER 6FR AR1 SH (CATHETERS) ×1 IMPLANT
CATH LAUNCHER 6FR EBU 3 (CATHETERS) ×1 IMPLANT
CATH LAUNCHER 6FR EBU3.5 (CATHETERS) ×1 IMPLANT
CATH OPTICROSS HD (CATHETERS) ×1 IMPLANT
CATH OPTITORQUE TIG 4.0 5F (CATHETERS) ×1 IMPLANT
CATH TELEPORT (CATHETERS) ×1 IMPLANT
CATH VISTA GUIDE 6FR 3DRCSH (CATHETERS) ×1 IMPLANT
CATHETER LAUNCHER 6FR JR4 SH (CATHETERS) ×1 IMPLANT
DEVICE RAD COMP TR BAND LRG (VASCULAR PRODUCTS) ×1 IMPLANT
ELECT DEFIB PAD ADLT CADENCE (PAD) ×1 IMPLANT
GLIDESHEATH SLEND A-KIT 6F 22G (SHEATH) ×1 IMPLANT
GUIDEWIRE INQWIRE 1.5J.035X260 (WIRE) IMPLANT
INQWIRE 1.5J .035X260CM (WIRE) ×2
KIT ENCORE 26 ADVANTAGE (KITS) ×1 IMPLANT
KIT HEART LEFT (KITS) ×2 IMPLANT
KIT HEMO VALVE WATCHDOG (MISCELLANEOUS) ×1 IMPLANT
LUBRICANT VIPERSLIDE CORONARY (MISCELLANEOUS) ×1 IMPLANT
PACK CARDIAC CATHETERIZATION (CUSTOM PROCEDURE TRAY) ×2 IMPLANT
SLED PULL BACK IVUS (MISCELLANEOUS) ×1 IMPLANT
TRANSDUCER W/STOPCOCK (MISCELLANEOUS) ×2 IMPLANT
TUBING CIL FLEX 10 FLL-RA (TUBING) ×2 IMPLANT
WIRE ASAHI PROWATER 300CM (WIRE) ×1 IMPLANT
WIRE HI TORQ WHISPER MS 300CM (WIRE) ×1 IMPLANT

## 2020-02-15 NOTE — H&P (Signed)
OV 3/11 copied for documentation     Patient referred by No ref. provider found for  exertional dyspnea, dizziness.  Subjective:   Jessica [name], female    DOB: 1951/07/21, 69 y.o.   MRN: [id number]   No chief complaint on file.   69 y.o. Caucasian female, former smoker, family h/o CAD, elevated calcium score with exertional dyspnea, dizziness.  Work-up so far showed significantly elevated calcium score, mild ischemia on stress test with mild reduced stress LVEF. EF was normal on echocardiogram.  Patient was getting her echocardiogram today, and was found to be profoundly short of breath.  Thus, urgent follow-up was arranged.   After reviewing her calcium score scan, I had started on aspirin 81 mg, atorvastatin 40 mg, and changed to propranolol 120 mg to metoprolol tartrate 25 mg twice daily.  Patient is in ventricualr bigeminy today.  Initiated consultation HPI 02/10/2020: She used to be active, walking with her girlfriends before [place]. Since COVID, her physical activity has significantly reduced. She complains of exertional dyspnea with walking inside the house. She reports feeling lightheaded, usually with standing. She denies any chest pain.   She has significant family h/o CAD in her mother and brother.    No current facility-administered medications on file prior to encounter.   Current Outpatient Medications on File Prior to Encounter  Medication Sig Dispense Refill  . acetaminophen (TYLENOL) 500 MG tablet Take 500 mg by mouth every 6 (six) hours as needed for moderate pain or headache.    . ALPRAZolam (XANAX) 0.5 MG tablet Take 0.5 mg by mouth 3 (three) times daily as needed for anxiety.     Marland Kitchen aspirin EC 81 MG tablet Take 1 tablet (81 mg total) by mouth daily. 90 tablet 3  . atorvastatin (LIPITOR) 20 MG tablet Take 20 mg by mouth daily.    . butalbital-acetaminophen-caffeine (FIORICET, ESGIC) 50-325-40 MG per tablet Take 1 tablet by mouth 3 (three) times daily as needed for  headache or migraine.     . cholestyramine (QUESTRAN) 4 g packet DISSOLVE & TAKE 1 POWDER BY MOUTH TWICE DAILY (Patient taking differently: Take 4 g by mouth 2 (two) times daily. ) 60 each 2  . dicyclomine (BENTYL) 20 MG tablet Take 20 mg by mouth 2 (two) times daily.    . ergocalciferol (VITAMIN D2) 50000 UNITS capsule Take 50,000 Units by mouth every Thursday.     . estradiol (ESTRACE) 2 MG tablet Take 2 mg by mouth daily.    Marland Kitchen gabapentin (NEURONTIN) 300 MG capsule Take 300 mg by mouth 2 (two) times daily as needed (nerve pain).     Marland Kitchen glimepiride (AMARYL) 4 MG tablet Take 4 mg by mouth in the morning and at bedtime.     Marland Kitchen HYDROcodone-acetaminophen (NORCO/VICODIN) 5-325 MG tablet Take 1 tablet by mouth daily as needed for severe pain.     Marland Kitchen ibuprofen (IBU) 800 MG tablet Take 800 mg by mouth every 8 (eight) hours as needed for moderate pain.    Marland Kitchen levothyroxine (SYNTHROID) 175 MCG tablet Take 175 mcg by mouth daily before breakfast.    . loperamide (IMODIUM A-D) 2 MG tablet Take 4 mg by mouth 3 (three) times daily as needed for diarrhea or loose stools.    . metoprolol tartrate (LOPRESSOR) 25 MG tablet Take 1 tablet (25 mg total) by mouth 2 (two) times daily. 60 tablet 3  . Multiple Vitamin (MULTIVITAMIN) tablet Take 1 tablet by mouth daily.    Marland Kitchen omeprazole (PRILOSEC)  40 MG capsule Take 1 capsule (40 mg total) by mouth 2 (two) times daily. 60 capsule 2  . pioglitazone (ACTOS) 15 MG tablet Take 15 mg by mouth daily.    . Potassium Chloride ER 20 MEQ TBCR Take 20 mEq by mouth 2 (two) times daily.     . promethazine (PHENERGAN) 25 MG tablet Take 25 mg by mouth 3 (three) times daily as needed for nausea or vomiting.     Marland Kitchen atorvastatin (LIPITOR) 40 MG tablet Take 0.5 tablets (20 mg total) by mouth daily. (Patient not taking: Reported on 02/11/2020) 30 tablet 2  . ondansetron (ZOFRAN) 4 MG tablet Take 1 tablet (4 mg total) by mouth every 6 (six) hours as needed for nausea or vomiting. (Patient not taking:  Reported on 02/11/2020) 30 tablet 1  . promethazine (PHENERGAN) 25 MG suppository Place 1 suppository (25 mg total) rectally every 8 (eight) hours as needed for nausea or vomiting. 6 each 0    Cardiovascular and other pertinent studies:  Echocardiogram 02/10/2020: Left ventricle cavity is normal in size. Moderate concentric hypertrophy of the left ventricle. Normal global wall motion. Normal LV systolic function with visual EF 55-60%. Diastolic function not assessed due to ventricular bigeminy.  Left atrial cavity is mildly dilated. Mild to moderate mitral regurgitation. IVC is dilated with respiratory variation. Estimated RA pressure 8 mmHg.  Lexiscan Tetrofosmin stress test 02/09/2020: Stress EKG is non-diagnostic, as this is pharmacological stress test. Rest and stress EKG show sinus rhythm with ventricular bigeminy/trigeminy. Small size, mild intensity, reversible perfusion defect in basal anteroseptal myocardium. Stress LVEF 49%. Intermediate risk stress test.  CT Cardiac scoring 02/01/2020: 1. Calcium score of 868. This is between the 90th and 100 percentile for females between the ages of 67 and 60. This is consistent with extensive atherosclerotic plaque. There is high likelihood of at least one significant coronary artery narrowing.  EKG 01/31/2020: Sinus rhythm 74 bpm. Anteroseptal T wave inversion, cannot exclude ischemia.   Recent labs: 01/24/2020: Glucose 87, BUN/Cr 19/0.87. EGFR >=60. Na/K 139/5.0. Rest of the CMP normal H/H 15.1/43.4. MCV 94.8. Platelets 227 HbA1C 5.7 %  Review of Systems  Cardiovascular: Positive for dyspnea on exertion. Negative for chest pain, leg swelling, palpitations and syncope.  Respiratory: Positive for shortness of breath.   Neurological: Positive for light-headedness.         Vitals:   02/15/20 0557  BP: 129/63  Pulse: 60  Resp: 16  Temp: 97.9 F (36.6 C)  SpO2: 98%     Body mass index is 29.05 kg/m. Filed Weights   02/15/20  0557  Weight: 81.6 kg     Objective:   Physical Exam  Constitutional: She appears well-developed and well-nourished.  Neck: No JVD present.  Cardiovascular: Normal rate, regular rhythm, normal heart sounds and intact distal pulses.  No murmur heard. Pulmonary/Chest: Effort normal and breath sounds normal. She has no wheezes. She has no rales.  Musculoskeletal:        General: No edema.  Nursing note and vitals reviewed.       Assessment & Recommendations:   69 y.o. Caucasian female, former smoker, family h/o CAD, elevated calcium score with exertional dyspnea, dizziness.  Exertional dyspnea, dizziness: Suspect angina equivalent in the setting of elevated calcium score and mild anteroseptal ischemia. Dizziness could be due to ventricular bigeminy. Intermediate risk stress test with stress LVEF 49%. Need to exclude ischemia as etiology. Recommend coronary angiogram and possible intervention. If no obstructive CAD found, she my need  consideration for PVC ablation. She will be wearing 24 hr Holter monitor today.   Continue Aspirin 81 mg. atorvastatin 40 mg, metoprolol tartarate 25 mg bid.  Risks/benefits/alternate options explained to the patient and husband. Patient was quite emotional with initial discussion, but was able to understand  Nigel Mormon, MD [facility] Pager: [phone] Office: [phone]

## 2020-02-15 NOTE — Progress Notes (Signed)
Headache and nausea continue, Dr Virgina Jock was updated and order followed for home HA med.

## 2020-02-15 NOTE — Discharge Instructions (Signed)

## 2020-02-15 NOTE — Progress Notes (Signed)
  Patwardhan to bedside, approved pt for discharge. Pt stable and dressing for D/C.

## 2020-02-15 NOTE — Interval H&P Note (Signed)
History and Physical Interval Note:  02/15/2020 7:35 AM  Jessica [name]  has presented today for surgery, with the diagnosis of Shortness of breath.  The various methods of treatment have been discussed with the patient and family. After consideration of risks, benefits and other options for treatment, the patient has consented to  Procedure(s): LEFT HEART CATH AND CORONARY ANGIOGRAPHY (N/A) as a surgical intervention.  The patient's history has been reviewed, patient examined, no change in status, stable for surgery.  I have reviewed the patient's chart and labs.  Questions were answered to the patient's satisfaction.    2016/2017 Appropriate Use Criteria for Coronary Revascularization Clinical Presentation: Diabetes Mellitus? Symptom Status? S/P CABG? Antianginal Therapy (# of long-acting drugs)? Results of Non-invasive testing? FFR/iFR results in all diseased vessels? Patient undergoing renal transplant? Patient undergoing percutaneous valve procedure (TAVR, MitraClip, Others)? Symptom Status:  Ischemic Symptoms  Non-invasive Testing:  High risk  If no or indeterminate stress test, FFR/iFR results in all diseased vessels:  N/A  Diabetes Mellitus:  No  S/P CABG:  No  Antianginal therapy (number of long-acting drugs):  1  Patient undergoing renal transplant:  No  Patient undergoing percutaneous valve procedure:  No    newline 1 Vessel Disease PCI CABG  No proximal LAD involvement, No proximal left dominant LCX involvement M (6); Indication 2 M (4); Indication 2   Proximal left dominant LCX involvement A (7); Indication 5 A (7); Indication 5   Proximal LAD involvement A (7); Indication 5 A (7); Indication 5   newline 2 Vessel Disease  No proximal LAD involvement A (7); Indication 8 M (6); Indication 8   Proximal LAD involvement A (7); Indication 11 A (7); Indication 11   newline 3 Vessel Disease  Low disease complexity (e.g., focal stenoses, SYNTAX <=22) A (7); Indication 17 A (8);  Indication 17   Intermediate or high disease complexity (e.g., SYNTAX >=23) M (6); Indication 21 A (8); Indication 21   newline Left Main Disease  Isolated LMCA disease: ostial or midshaft A (7); Indication 24 A (9); Indication 24   Isolated LMCA disease: bifurcation involvement M (5); Indication 25 A (9); Indication 25   LMCA ostial or midshaft, concurrent low disease burden multivessel disease (e.g., 1-2 additional focal stenoses, SYNTAX <=22) A (7); Indication 26 A (9); Indication 26   LMCA ostial or midshaft, concurrent intermediate or high disease burden multivessel disease (e.g., 1-2 additional bifurcation stenoses, long stenoses, SYNTAX >=23) M (4); Indication 27 A (9); Indication 27   LMCA bifurcation involvement, concurrent low disease burden multivessel disease (e.g., 1-2 additional focal stenoses, SYNTAX <=22) M (5); Indication 28 A (9); Indication 28   LMCA bifurcation involvement, concurrent intermediate or high disease burden multivessel disease (e.g., 1-2 additional bifurcation stenoses, long stenoses, SYNTAX >=23) R (3); Indication 29 A (9); Indication [place]

## 2020-02-16 ENCOUNTER — Telehealth: Payer: Self-pay | Admitting: Cardiology

## 2020-02-16 MED FILL — Verapamil HCl IV Soln 2.5 MG/ML: INTRAVENOUS | Qty: 2 | Status: AC

## 2020-02-16 NOTE — Progress Notes (Signed)
Pt took am meds // emesis soon after, pt was told to call Dr Bonney Roussel office for further advise on taking another dose. Pt verbalized understanding.

## 2020-02-24 ENCOUNTER — Encounter: Payer: Self-pay | Admitting: Cardiology

## 2020-02-24 ENCOUNTER — Other Ambulatory Visit: Payer: Self-pay

## 2020-02-24 ENCOUNTER — Ambulatory Visit: Payer: Medicare Other | Admitting: Cardiology

## 2020-02-24 VITALS — BP 128/58 | HR 55 | Temp 98.9°F | Resp 16 | Ht 66.0 in | Wt 179.1 lb

## 2020-02-24 DIAGNOSIS — R931 Abnormal findings on diagnostic imaging of heart and coronary circulation: Secondary | ICD-10-CM | POA: Diagnosis not present

## 2020-02-24 DIAGNOSIS — R0609 Other forms of dyspnea: Secondary | ICD-10-CM | POA: Diagnosis not present

## 2020-02-24 DIAGNOSIS — I498 Other specified cardiac arrhythmias: Secondary | ICD-10-CM

## 2020-02-24 DIAGNOSIS — I25118 Atherosclerotic heart disease of native coronary artery with other forms of angina pectoris: Secondary | ICD-10-CM

## 2020-02-24 DIAGNOSIS — R0600 Dyspnea, unspecified: Secondary | ICD-10-CM

## 2020-02-24 MED ORDER — PANTOPRAZOLE SODIUM 40 MG PO TBEC: 40.0000 mg | DELAYED_RELEASE_TABLET | Freq: Every day | ORAL | 2 refills | Status: DC

## 2020-02-24 NOTE — Progress Notes (Signed)
Patient referred by Jani Gravel, MD for  exertional dyspnea, dizziness.  Subjective:   Jessica [name], female    DOB: August 23, 1951, 69 y.o.   MRN: [id number]   Chief Complaint  Patient presents with  . Coronary Artery Disease  . Follow-up    7-10 days    70 y.o. Caucasian female w/CAD with angina equivalent, ventricular bigeminy, former smoker, family h/o CAD.  Work-up showed significantly elevated calcium score, mild ischemia on stress test with mild reduced stress LVEF. EF was normal on echocardiogram.  Coronary angiogram showed severe proximal LAD and ostial RCA calcific disease.  Attempted intervention through radial access was unsuccessful due to poor guide support.  We decided to stage this through femoral access, with the use of atherectomy.   Patient is here for follow-up with her husband today. She continues to have exertional dyspnea. She has occasional "twinges" of chest pain.   Initiated consultation HPI 02/10/2020: She used to be active, walking with her girlfriends before [place]. Since COVID, her physical activity has significantly reduced. She complains of exertional dyspnea with walking inside the house. She reports feeling lightheaded, usually with standing. She denies any chest pain.   She has significant family h/o CAD in her mother and brother.    Current Outpatient Medications on File Prior to Visit  Medication Sig Dispense Refill  . acetaminophen (TYLENOL) 500 MG tablet Take 500 mg by mouth every 6 (six) hours as needed for moderate pain or headache.    . ALPRAZolam (XANAX) 0.5 MG tablet Take 0.5 mg by mouth 3 (three) times daily as needed for anxiety.     Marland Kitchen amLODipine (NORVASC) 2.5 MG tablet Take 1 tablet (2.5 mg total) by mouth daily. 30 tablet 2  . aspirin EC 81 MG tablet Take 1 tablet (81 mg total) by mouth daily. 90 tablet 3  . atorvastatin (LIPITOR) 40 MG tablet Take 1 tablet (40 mg total) by mouth daily at 6 PM. 90 tablet 2  .  butalbital-acetaminophen-caffeine (FIORICET, ESGIC) 50-325-40 MG per tablet Take 1 tablet by mouth 3 (three) times daily as needed for headache or migraine.     . cholestyramine (QUESTRAN) 4 g packet DISSOLVE & TAKE 1 POWDER BY MOUTH TWICE DAILY (Patient taking differently: Take 4 g by mouth 2 (two) times daily. ) 60 each 2  . clopidogrel (PLAVIX) 75 MG tablet Take 1 tablet (75 mg total) by mouth daily. 30 tablet 2  . dicyclomine (BENTYL) 20 MG tablet Take 20 mg by mouth 2 (two) times daily.    . ergocalciferol (VITAMIN D2) 50000 UNITS capsule Take 50,000 Units by mouth every Thursday.     . estradiol (ESTRACE) 2 MG tablet Take 2 mg by mouth daily.    Marland Kitchen gabapentin (NEURONTIN) 300 MG capsule Take 300 mg by mouth 2 (two) times daily as needed (nerve pain).     Marland Kitchen glimepiride (AMARYL) 4 MG tablet Take 4 mg by mouth in the morning and at bedtime.     Marland Kitchen ibuprofen (IBU) 800 MG tablet Take 800 mg by mouth every 8 (eight) hours as needed for moderate pain.    Marland Kitchen levothyroxine (SYNTHROID) 175 MCG tablet Take 175 mcg by mouth daily before breakfast.    . loperamide (IMODIUM A-D) 2 MG tablet Take 4 mg by mouth 3 (three) times daily as needed for diarrhea or loose stools.    . metoprolol tartrate (LOPRESSOR) 25 MG tablet Take 1 tablet (25 mg total) by mouth 2 (two) times daily.  60 tablet 3  . Multiple Vitamin (MULTIVITAMIN) tablet Take 1 tablet by mouth daily.    . nitroGLYCERIN (NITROSTAT) 0.4 MG SL tablet Place 1 tablet (0.4 mg total) under the tongue every 5 (five) minutes as needed for chest pain. 30 tablet 2  . omeprazole (PRILOSEC) 40 MG capsule Take 1 capsule (40 mg total) by mouth 2 (two) times daily. 60 capsule 2  . ondansetron (ZOFRAN) 4 MG tablet Take 1 tablet (4 mg total) by mouth every 6 (six) hours as needed for nausea or vomiting. 30 tablet 1  . pioglitazone (ACTOS) 15 MG tablet Take 15 mg by mouth daily.    . Potassium Chloride ER 20 MEQ TBCR Take 20 mEq by mouth 2 (two) times daily.     .  promethazine (PHENERGAN) 25 MG suppository Place 1 suppository (25 mg total) rectally every 8 (eight) hours as needed for nausea or vomiting. 6 each 0  . promethazine (PHENERGAN) 25 MG tablet Take 25 mg by mouth 3 (three) times daily as needed for nausea or vomiting.      Current Facility-Administered Medications on File Prior to Visit  Medication Dose Route Frequency Provider Last Rate Last Admin  . 0.9 %  sodium chloride infusion  500 mL Intravenous Once Ladene Artist, MD        Cardiovascular and other pertinent studies:  EKG 02/24/2020: Sinus rhythm 76 bpm.  Early R wave transition Nonspecific ST-T changes.   Coronary angiography and attempted intervention 02/15/2020: LM: Short, normal. LAD: Small to medium caliber vessel with prox-mid, eccentric 80% stenosis with moderate calcification.          Occluded calcific Diag 1, likely originating at the site of LAD stenosis.         Under filled apical LAD         Left-to-left (LCx-LAD/Diag) and right-to-left (RCA-LAD/Diag) collaterals LCx: Large vessel. Prox 30% stenosis. RCA: Ostial, eccentric 80% stenosis with moderate calcification.   LVEDP normal.  Attempted LAD/RCA wiring. Intervention aborted due to poor guide support and difficulty wiring through radial access.   Findings discussed with the patient and husband (over the phone). I will further discuss revascularization options with the patient, including 2 vessel CABG vs atherectomy + PCI through femoral access. If the patient choses the latter, will stage this on anotehr day.   Continue Aspirin/plavix, lipitor, metoprolol for now.  Echocardiogram 02/10/2020: Left ventricle cavity is normal in size. Moderate concentric hypertrophy of the left ventricle. Normal global wall motion. Normal LV systolic function with visual EF 55-60%. Diastolic function not assessed due to ventricular bigeminy.  Left atrial cavity is mildly dilated. Mild to moderate mitral regurgitation. IVC  is dilated with respiratory variation. Estimated RA pressure 8 mmHg.  Lexiscan Tetrofosmin stress test 02/09/2020: Stress EKG is non-diagnostic, as this is pharmacological stress test. Rest and stress EKG show sinus rhythm with ventricular bigeminy/trigeminy. Small size, mild intensity, reversible perfusion defect in basal anteroseptal myocardium. Stress LVEF 49%. Intermediate risk stress test.  CT Cardiac scoring 02/01/2020: 1. Calcium score of 868. This is between the 90th and 100 percentile for females between the ages of 60 and 12. This is consistent with extensive atherosclerotic plaque. There is high likelihood of at least one significant coronary artery narrowing.  Recent labs: 01/24/2020: Glucose 87, BUN/Cr 19/0.87. EGFR >=60. Na/K 139/5.0. Rest of the CMP normal H/H 15.1/43.4. MCV 94.8. Platelets 227 HbA1C 5.7 %  Review of Systems  Cardiovascular: Positive for dyspnea on exertion. Negative for chest pain, leg  swelling, palpitations and syncope.  Respiratory: Positive for shortness of breath.   Neurological: Positive for light-headedness.         Vitals:   02/24/20 1349 02/24/20 1352  BP: (!) 75/54 (!) 128/58  Pulse: (!) 54 (!) 55  Resp: 16   Temp: 98.9 F (37.2 C)   SpO2: 95%      Body mass index is 28.91 kg/m. Filed Weights   02/24/20 1349  Weight: 179 lb 1.6 oz (81.2 kg)     Objective:   Physical Exam  Constitutional: She appears well-developed and well-nourished.  Neck: No JVD present.  Cardiovascular: Normal rate, regular rhythm, normal heart sounds and intact distal pulses.  No murmur heard. Pulmonary/Chest: Effort normal and breath sounds normal. She has no wheezes. She has no rales.  Musculoskeletal:        General: No edema.  Nursing note and vitals reviewed.       Assessment & Recommendations:   69 y.o. Caucasian female w/CAD with angina equivalent, ventricular bigeminy, former smoker, family h/o CAD.  CAD: Severe calcific proximal LAD  and ostial RCA disease. Plan for femoral access atherectomy and intervention.  Continue aspirin, Plavix, atorvastatin 40 mg, metoprolol tartrate 25 mg daily, amlodipine 2.5 mg daily.  We discussed regarding risks, benefits, alternatives to this including continued medical therapy or CABG. We specifically discussed risks related to PCI, atherectomy, fmoral access, inclufing but not limited to, 3-4% risk of death, stroke, MI, coronary dissection, perforation, urgent CABG, bleeding, infection, renal failure but not limited to these.  Ventricular bigeminy: Improved today. If persists after successful coronary intervention, could consider further EP evaluation.  For now, continue above medical therapy.  Nigel Mormon, MD [facility] [place] Pager: [phone] Office: [phone]

## 2020-02-25 ENCOUNTER — Other Ambulatory Visit (HOSPITAL_COMMUNITY)
Admission: RE | Admit: 2020-02-25 | Discharge: 2020-02-25 | Disposition: A | Discharge status: Home / Self Care | Payer: Medicare Other | Source: Ambulatory Visit | Attending: Cardiology | Admitting: Cardiology

## 2020-02-25 DIAGNOSIS — Z20822 Contact with and (suspected) exposure to covid-19: Secondary | ICD-10-CM | POA: Insufficient documentation

## 2020-02-25 DIAGNOSIS — Z01812 Encounter for preprocedural laboratory examination: Secondary | ICD-10-CM | POA: Insufficient documentation

## 2020-02-25 LAB — SARS CORONAVIRUS 2 (TAT 6-24 HRS): SARS Coronavirus 2: NEGATIVE

## 2020-02-29 ENCOUNTER — Encounter (HOSPITAL_COMMUNITY): Payer: Self-pay | Admitting: Cardiology

## 2020-02-29 ENCOUNTER — Other Ambulatory Visit: Payer: Self-pay

## 2020-02-29 ENCOUNTER — Ambulatory Visit (HOSPITAL_COMMUNITY)
Admission: RE | Admit: 2020-02-29 | Discharge: 2020-03-01 | Disposition: A | Discharge status: Home / Self Care | Payer: Medicare Other | Attending: Cardiology | Admitting: Cardiology

## 2020-02-29 ENCOUNTER — Ambulatory Visit (HOSPITAL_COMMUNITY)
Admission: RE | Disposition: A | Discharge status: Home / Self Care | Payer: Self-pay | Source: Home / Self Care | Attending: Cardiology

## 2020-02-29 DIAGNOSIS — Z87891 Personal history of nicotine dependence: Secondary | ICD-10-CM | POA: Insufficient documentation

## 2020-02-29 DIAGNOSIS — Z885 Allergy status to narcotic agent status: Secondary | ICD-10-CM | POA: Diagnosis not present

## 2020-02-29 DIAGNOSIS — I2584 Coronary atherosclerosis due to calcified coronary lesion: Secondary | ICD-10-CM | POA: Diagnosis not present

## 2020-02-29 DIAGNOSIS — K3184 Gastroparesis: Secondary | ICD-10-CM | POA: Diagnosis not present

## 2020-02-29 DIAGNOSIS — Z8249 Family history of ischemic heart disease and other diseases of the circulatory system: Secondary | ICD-10-CM | POA: Diagnosis not present

## 2020-02-29 DIAGNOSIS — E119 Type 2 diabetes mellitus without complications: Secondary | ICD-10-CM | POA: Insufficient documentation

## 2020-02-29 DIAGNOSIS — R008 Other abnormalities of heart beat: Secondary | ICD-10-CM | POA: Insufficient documentation

## 2020-02-29 DIAGNOSIS — I2510 Atherosclerotic heart disease of native coronary artery without angina pectoris: Secondary | ICD-10-CM | POA: Diagnosis present

## 2020-02-29 DIAGNOSIS — Z79899 Other long term (current) drug therapy: Secondary | ICD-10-CM | POA: Insufficient documentation

## 2020-02-29 DIAGNOSIS — Z7982 Long term (current) use of aspirin: Secondary | ICD-10-CM | POA: Diagnosis not present

## 2020-02-29 DIAGNOSIS — I25118 Atherosclerotic heart disease of native coronary artery with other forms of angina pectoris: Secondary | ICD-10-CM | POA: Insufficient documentation

## 2020-02-29 DIAGNOSIS — Z7902 Long term (current) use of antithrombotics/antiplatelets: Secondary | ICD-10-CM | POA: Diagnosis not present

## 2020-02-29 DIAGNOSIS — R9439 Abnormal result of other cardiovascular function study: Secondary | ICD-10-CM | POA: Diagnosis present

## 2020-02-29 DIAGNOSIS — Z9861 Coronary angioplasty status: Secondary | ICD-10-CM

## 2020-02-29 DIAGNOSIS — I10 Essential (primary) hypertension: Secondary | ICD-10-CM | POA: Insufficient documentation

## 2020-02-29 DIAGNOSIS — Z7984 Long term (current) use of oral hypoglycemic drugs: Secondary | ICD-10-CM | POA: Diagnosis not present

## 2020-02-29 DIAGNOSIS — Z888 Allergy status to other drugs, medicaments and biological substances status: Secondary | ICD-10-CM | POA: Insufficient documentation

## 2020-02-29 DIAGNOSIS — Z79890 Hormone replacement therapy (postmenopausal): Secondary | ICD-10-CM | POA: Insufficient documentation

## 2020-02-29 DIAGNOSIS — R0600 Dyspnea, unspecified: Secondary | ICD-10-CM | POA: Diagnosis present

## 2020-02-29 DIAGNOSIS — R0609 Other forms of dyspnea: Secondary | ICD-10-CM | POA: Diagnosis present

## 2020-02-29 HISTORY — PX: INTRAVASCULAR ULTRASOUND/IVUS: CATH118244

## 2020-02-29 HISTORY — PX: CORONARY BALLOON ANGIOPLASTY: CATH118233

## 2020-02-29 HISTORY — PX: CORONARY ATHERECTOMY: CATH118238

## 2020-02-29 HISTORY — PX: LEFT HEART CATH: CATH118248

## 2020-02-29 LAB — POCT I-STAT, CHEM 8
BUN: 8 mg/dL (ref 8–23)
Calcium, Ion: 1.14 mmol/L — ABNORMAL LOW (ref 1.15–1.40)
Chloride: 104 mmol/L (ref 98–111)
Creatinine, Ser: 0.5 mg/dL (ref 0.44–1.00)
Glucose, Bld: 147 mg/dL — ABNORMAL HIGH (ref 70–99)
HCT: 36 % (ref 36.0–46.0)
Hemoglobin: 12.2 g/dL (ref 12.0–15.0)
Potassium: 5 mmol/L (ref 3.5–5.1)
Sodium: 138 mmol/L (ref 135–145)
TCO2: 28 mmol/L (ref 22–32)

## 2020-02-29 LAB — POCT ACTIVATED CLOTTING TIME
Activated Clotting Time: 191 seconds
Activated Clotting Time: 219 seconds
Activated Clotting Time: 296 seconds
Activated Clotting Time: 312 seconds

## 2020-02-29 LAB — HEMOGLOBIN A1C
Hgb A1c MFr Bld: 5.5 % (ref 4.8–5.6)
Mean Plasma Glucose: 111.15 mg/dL

## 2020-02-29 LAB — GLUCOSE, CAPILLARY
Glucose-Capillary: 144 mg/dL — ABNORMAL HIGH (ref 70–99)
Glucose-Capillary: 122 mg/dL — ABNORMAL HIGH (ref 70–99)
Glucose-Capillary: 154 mg/dL — ABNORMAL HIGH (ref 70–99)
Glucose-Capillary: 155 mg/dL — ABNORMAL HIGH (ref 70–99)

## 2020-02-29 SURGERY — LEFT HEART CATH
Anesthesia: LOCAL

## 2020-02-29 MED ORDER — HEPARIN (PORCINE) IN NACL 1000-0.9 UT/500ML-% IV SOLN
INTRAVENOUS | Status: AC
  Filled 2020-02-29: qty 1000

## 2020-02-29 MED ORDER — ACETAMINOPHEN 325 MG PO TABS
650.0000 mg | ORAL_TABLET | ORAL | Status: DC | PRN
  Administered 2020-03-01: 650 mg via ORAL
  Filled 2020-02-29: qty 2

## 2020-02-29 MED ORDER — HEPARIN (PORCINE) IN NACL 1000-0.9 UT/500ML-% IV SOLN
INTRAVENOUS | Status: AC
  Filled 2020-02-29: qty 500

## 2020-02-29 MED ORDER — PROMETHAZINE HCL 25 MG RE SUPP
25.0000 mg | Freq: Three times a day (TID) | RECTAL | Status: DC | PRN
  Filled 2020-02-29: qty 1

## 2020-02-29 MED ORDER — PANTOPRAZOLE SODIUM 40 MG PO TBEC
40.0000 mg | DELAYED_RELEASE_TABLET | Freq: Every day | ORAL | Status: DC
  Administered 2020-02-29 – 2020-03-01 (×2): 40 mg via ORAL
  Filled 2020-02-29 (×2): qty 1

## 2020-02-29 MED ORDER — SODIUM CHLORIDE 0.9% FLUSH
3.0000 mL | Freq: Two times a day (BID) | INTRAVENOUS | Status: DC
  Administered 2020-02-29: 3 mL via INTRAVENOUS

## 2020-02-29 MED ORDER — IOHEXOL 350 MG/ML SOLN
INTRAVENOUS | Status: DC | PRN
  Administered 2020-02-29: 160 mL

## 2020-02-29 MED ORDER — ONDANSETRON HCL 4 MG/2ML IJ SOLN
INTRAMUSCULAR | Status: AC
  Administered 2020-02-29: 4 mg via INTRAVENOUS
  Filled 2020-02-29: qty 2

## 2020-02-29 MED ORDER — SODIUM CHLORIDE 0.9 % IV SOLN: INTRAVENOUS | Status: AC

## 2020-02-29 MED ORDER — HEPARIN (PORCINE) IN NACL 1000-0.9 UT/500ML-% IV SOLN
INTRAVENOUS | Status: DC | PRN
  Administered 2020-02-29: 500 mL

## 2020-02-29 MED ORDER — SODIUM CHLORIDE 0.9 % IV SOLN
INTRAVENOUS | Status: DC | PRN
  Administered 2020-02-29 (×2): 1.75 mg/kg/h via INTRAVENOUS

## 2020-02-29 MED ORDER — NITROGLYCERIN 1 MG/10 ML FOR IR/CATH LAB
INTRA_ARTERIAL | Status: AC
  Filled 2020-02-29: qty 10

## 2020-02-29 MED ORDER — LIDOCAINE HCL (PF) 1 % IJ SOLN
INTRAMUSCULAR | Status: AC
  Filled 2020-02-29: qty 30

## 2020-02-29 MED ORDER — SODIUM CHLORIDE 0.9 % IV SOLN: 250.0000 mL | INTRAVENOUS | Status: DC | PRN

## 2020-02-29 MED ORDER — LOPERAMIDE HCL 2 MG PO CAPS: 4.0000 mg | ORAL_CAPSULE | Freq: Three times a day (TID) | ORAL | Status: DC | PRN

## 2020-02-29 MED ORDER — CLOPIDOGREL BISULFATE 75 MG PO TABS
75.0000 mg | ORAL_TABLET | ORAL | Status: AC
  Administered 2020-02-29: 75 mg via ORAL
  Filled 2020-02-29: qty 1

## 2020-02-29 MED ORDER — SODIUM CHLORIDE 0.9% FLUSH: 3.0000 mL | INTRAVENOUS | Status: DC | PRN

## 2020-02-29 MED ORDER — BIVALIRUDIN TRIFLUOROACETATE 250 MG IV SOLR
INTRAVENOUS | Status: AC
  Filled 2020-02-29: qty 250

## 2020-02-29 MED ORDER — ALPRAZOLAM 0.5 MG PO TABS
0.5000 mg | ORAL_TABLET | Freq: Three times a day (TID) | ORAL | Status: DC | PRN
  Administered 2020-02-29 – 2020-03-01 (×2): 0.5 mg via ORAL
  Filled 2020-02-29 (×2): qty 1

## 2020-02-29 MED ORDER — INSULIN ASPART 100 UNIT/ML ~~LOC~~ SOLN: 0.0000 [IU] | Freq: Every day | SUBCUTANEOUS | Status: DC

## 2020-02-29 MED ORDER — FENTANYL CITRATE (PF) 100 MCG/2ML IJ SOLN
INTRAMUSCULAR | Status: AC
  Filled 2020-02-29: qty 2

## 2020-02-29 MED ORDER — ONDANSETRON HCL 4 MG PO TABS: 4.0000 mg | ORAL_TABLET | Freq: Four times a day (QID) | ORAL | Status: DC | PRN

## 2020-02-29 MED ORDER — ATORVASTATIN CALCIUM 40 MG PO TABS
40.0000 mg | ORAL_TABLET | Freq: Every day | ORAL | Status: DC
  Administered 2020-02-29: 40 mg via ORAL
  Filled 2020-02-29: qty 1

## 2020-02-29 MED ORDER — MIDAZOLAM HCL 2 MG/2ML IJ SOLN
INTRAMUSCULAR | Status: AC
  Filled 2020-02-29: qty 2

## 2020-02-29 MED ORDER — LEVOTHYROXINE SODIUM 175 MCG PO TABS
175.0000 ug | ORAL_TABLET | Freq: Every day | ORAL | Status: DC
  Administered 2020-03-01: 175 ug via ORAL
  Filled 2020-02-29: qty 1

## 2020-02-29 MED ORDER — INSULIN ASPART 100 UNIT/ML ~~LOC~~ SOLN
0.0000 [IU] | Freq: Three times a day (TID) | SUBCUTANEOUS | Status: DC
  Administered 2020-02-29: 2 [IU] via SUBCUTANEOUS

## 2020-02-29 MED ORDER — LIDOCAINE HCL (PF) 1 % IJ SOLN
INTRAMUSCULAR | Status: DC | PRN
  Administered 2020-02-29: 15 mL

## 2020-02-29 MED ORDER — ONDANSETRON HCL 4 MG/2ML IJ SOLN
4.0000 mg | Freq: Four times a day (QID) | INTRAMUSCULAR | Status: DC | PRN
  Administered 2020-02-29 (×2): 4 mg via INTRAVENOUS
  Filled 2020-02-29: qty 2

## 2020-02-29 MED ORDER — SODIUM CHLORIDE 0.9% FLUSH: 3.0000 mL | Freq: Two times a day (BID) | INTRAVENOUS | Status: DC

## 2020-02-29 MED ORDER — SODIUM CHLORIDE 0.9 % WEIGHT BASED INFUSION
3.0000 mL/kg/h | INTRAVENOUS | Status: AC
  Administered 2020-02-29: 3 mL/kg/h via INTRAVENOUS

## 2020-02-29 MED ORDER — LABETALOL HCL 5 MG/ML IV SOLN: 10.0000 mg | INTRAVENOUS | Status: AC | PRN

## 2020-02-29 MED ORDER — ONDANSETRON HCL 4 MG/2ML IJ SOLN: 4.0000 mg | Freq: Once | INTRAMUSCULAR | Status: AC

## 2020-02-29 MED ORDER — METOPROLOL TARTRATE 25 MG PO TABS
25.0000 mg | ORAL_TABLET | Freq: Two times a day (BID) | ORAL | Status: DC
  Administered 2020-02-29 – 2020-03-01 (×2): 25 mg via ORAL
  Filled 2020-02-29 (×3): qty 1

## 2020-02-29 MED ORDER — NITROGLYCERIN 0.4 MG SL SUBL: 0.4000 mg | SUBLINGUAL_TABLET | SUBLINGUAL | Status: DC | PRN

## 2020-02-29 MED ORDER — DICYCLOMINE HCL 20 MG PO TABS
20.0000 mg | ORAL_TABLET | Freq: Two times a day (BID) | ORAL | Status: DC
  Administered 2020-02-29 – 2020-03-01 (×2): 20 mg via ORAL
  Filled 2020-02-29 (×3): qty 1

## 2020-02-29 MED ORDER — HYDRALAZINE HCL 20 MG/ML IJ SOLN
10.0000 mg | INTRAMUSCULAR | Status: AC | PRN
  Administered 2020-02-29: 10 mg via INTRAVENOUS
  Filled 2020-02-29: qty 1

## 2020-02-29 MED ORDER — ACETAMINOPHEN 500 MG PO TABS
500.0000 mg | ORAL_TABLET | Freq: Four times a day (QID) | ORAL | Status: DC | PRN
  Administered 2020-03-01: 500 mg via ORAL
  Filled 2020-02-29: qty 1

## 2020-02-29 MED ORDER — SODIUM CHLORIDE 0.9 % WEIGHT BASED INFUSION
1.0000 mL/kg/h | INTRAVENOUS | Status: DC
  Administered 2020-02-29: 1 mL/kg/h via INTRAVENOUS

## 2020-02-29 MED ORDER — BUTALBITAL-APAP-CAFFEINE 50-325-40 MG PO TABS
1.0000 | ORAL_TABLET | Freq: Three times a day (TID) | ORAL | Status: DC | PRN
  Administered 2020-03-01: 1 via ORAL
  Filled 2020-02-29: qty 1

## 2020-02-29 MED ORDER — IOHEXOL 350 MG/ML SOLN
INTRAVENOUS | Status: AC
  Filled 2020-02-29: qty 1

## 2020-02-29 MED ORDER — ONDANSETRON HCL 4 MG/2ML IJ SOLN
INTRAMUSCULAR | Status: AC
  Filled 2020-02-29: qty 2

## 2020-02-29 MED ORDER — BIVALIRUDIN BOLUS VIA INFUSION - CUPID
INTRAVENOUS | Status: DC | PRN
  Administered 2020-02-29: 60.225 mg via INTRAVENOUS

## 2020-02-29 MED ORDER — ASPIRIN 81 MG PO CHEW: 81.0000 mg | CHEWABLE_TABLET | ORAL | Status: DC

## 2020-02-29 MED ORDER — GABAPENTIN 300 MG PO CAPS
300.0000 mg | ORAL_CAPSULE | Freq: Two times a day (BID) | ORAL | Status: DC | PRN
  Administered 2020-02-29: 300 mg via ORAL
  Filled 2020-02-29: qty 1

## 2020-02-29 MED ORDER — MIDAZOLAM HCL 2 MG/2ML IJ SOLN
INTRAMUSCULAR | Status: DC | PRN
  Administered 2020-02-29 (×3): 1 mg via INTRAVENOUS
  Administered 2020-02-29: 2 mg via INTRAVENOUS
  Administered 2020-02-29 (×2): 1 mg via INTRAVENOUS

## 2020-02-29 MED ORDER — FENTANYL CITRATE (PF) 100 MCG/2ML IJ SOLN
INTRAMUSCULAR | Status: DC | PRN
  Administered 2020-02-29 (×2): 25 ug via INTRAVENOUS
  Administered 2020-02-29 (×3): 50 ug via INTRAVENOUS
  Administered 2020-02-29 (×2): 25 ug via INTRAVENOUS
  Administered 2020-02-29: 50 ug via INTRAVENOUS

## 2020-02-29 MED ORDER — PROMETHAZINE HCL 25 MG/ML IJ SOLN
6.2500 mg | Freq: Four times a day (QID) | INTRAMUSCULAR | Status: DC | PRN
  Filled 2020-02-29: qty 1

## 2020-02-29 MED ORDER — PROMETHAZINE HCL 25 MG/ML IJ SOLN
6.2500 mg | Freq: Four times a day (QID) | INTRAMUSCULAR | Status: DC | PRN
  Administered 2020-02-29 – 2020-03-01 (×2): 6.25 mg via INTRAVENOUS
  Filled 2020-02-29: qty 1

## 2020-02-29 MED ORDER — ASPIRIN EC 81 MG PO TBEC
81.0000 mg | DELAYED_RELEASE_TABLET | Freq: Every day | ORAL | Status: DC
  Administered 2020-03-01: 81 mg via ORAL
  Filled 2020-02-29: qty 1

## 2020-02-29 MED ORDER — ESTRADIOL 1 MG PO TABS
2.0000 mg | ORAL_TABLET | Freq: Every day | ORAL | Status: DC
  Filled 2020-02-29: qty 1
  Filled 2020-02-29 (×2): qty 2

## 2020-02-29 SURGICAL SUPPLY — 31 items
BALLN EUPHORA RX 2.0X6 (BALLOONS) ×2
BALLN SAPPHIRE 2.0X12 (BALLOONS) ×2
BALLN WOLVERINE 2.00X6 (BALLOONS) ×2
BALLOON EUPHORA RX 2.0X6 (BALLOONS) IMPLANT
BALLOON SAPPHIRE 2.0X12 (BALLOONS) IMPLANT
BALLOON WOLVERINE 2.00X6 (BALLOONS) IMPLANT
CATH INFINITI 5FR ANG PIGTAIL (CATHETERS) ×1 IMPLANT
CATH INFINITI JR4 5F (CATHETERS) ×1 IMPLANT
CATH LAUNCHER 6FR AL.75 (CATHETERS) ×1 IMPLANT
CATH LAUNCHER 6FR AR1 SH (CATHETERS) ×1 IMPLANT
CATH OPTICROSS 40MHZ (CATHETERS) ×1 IMPLANT
CATH TELEPORT (CATHETERS) ×1 IMPLANT
CATH VISTA GUIDE 6FR JR4 (CATHETERS) ×1 IMPLANT
CATHETER LAUNCHER 6FR NOTO (CATHETERS) ×1 IMPLANT
CLOSURE MYNX CONTROL 6F/7F (Vascular Products) ×1 IMPLANT
ELECT DEFIB PAD ADLT CADENCE (PAD) ×1 IMPLANT
KIT ENCORE 26 ADVANTAGE (KITS) ×1 IMPLANT
KIT HEART LEFT (KITS) ×2 IMPLANT
KIT HEMO VALVE WATCHDOG (MISCELLANEOUS) ×1 IMPLANT
KIT MICROPUNCTURE NIT STIFF (SHEATH) ×1 IMPLANT
PACK CARDIAC CATHETERIZATION (CUSTOM PROCEDURE TRAY) ×2 IMPLANT
SHEATH BRITE TIP 6FR 35CM (SHEATH) ×1 IMPLANT
SHEATH PINNACLE 6F 10CM (SHEATH) ×1 IMPLANT
SHEATH PROBE COVER 6X72 (BAG) ×1 IMPLANT
SLED PULL BACK IVUS (MISCELLANEOUS) ×1 IMPLANT
TRANSDUCER W/STOPCOCK (MISCELLANEOUS) ×2 IMPLANT
TUBING CIL FLEX 10 FLL-RA (TUBING) ×2 IMPLANT
WIRE COUGAR XT STRL 190CM (WIRE) ×1 IMPLANT
WIRE COUGAR XT STRL 300CM (WIRE) ×1 IMPLANT
WIRE EMERALD 3MM-J .035X150CM (WIRE) ×2 IMPLANT
WIRE VIPERWIRE COR FLEX .012 (WIRE) ×1 IMPLANT

## 2020-02-29 NOTE — H&P (Signed)
OV 3/25 copied for documentation     Patient referred by No ref. provider found for  exertional dyspnea, dizziness.  Subjective:   Jessica [name], female    DOB: Jan 07, 1951, 69 y.o.   MRN: [id number]   No chief complaint on file.   69 y.o. Caucasian female w/CAD with angina equivalent, ventricular bigeminy, former smoker, family h/o CAD.  Work-up showed significantly elevated calcium score, mild ischemia on stress test with mild reduced stress LVEF. EF was normal on echocardiogram.  Coronary angiogram showed severe proximal LAD and ostial RCA calcific disease.  Attempted intervention through radial access was unsuccessful due to poor guide support.  We decided to stage this through femoral access, with the use of atherectomy.   Patient is here for follow-up with her husband today. She continues to have exertional dyspnea. She has occasional "twinges" of chest pain.   Initiated consultation HPI 02/10/2020: She used to be active, walking with her girlfriends before [place]. Since COVID, her physical activity has significantly reduced. She complains of exertional dyspnea with walking inside the house. She reports feeling lightheaded, usually with standing. She denies any chest pain.   She has significant family h/o CAD in her mother and brother.    No current facility-administered medications on file prior to encounter.   Current Outpatient Medications on File Prior to Encounter  Medication Sig Dispense Refill  . acetaminophen (TYLENOL) 500 MG tablet Take 500 mg by mouth every 6 (six) hours as needed for moderate pain or headache.    . ALPRAZolam (XANAX) 0.5 MG tablet Take 0.5 mg by mouth 3 (three) times daily as needed for anxiety.     Marland Kitchen amLODipine (NORVASC) 2.5 MG tablet Take 1 tablet (2.5 mg total) by mouth daily. 30 tablet 2  . aspirin EC 81 MG tablet Take 1 tablet (81 mg total) by mouth daily. 90 tablet 3  . atorvastatin (LIPITOR) 40 MG tablet Take 1 tablet (40 mg total) by mouth daily  at 6 PM. 90 tablet 2  . butalbital-acetaminophen-caffeine (FIORICET, ESGIC) 50-325-40 MG per tablet Take 1 tablet by mouth 3 (three) times daily as needed for headache or migraine.     . cholestyramine (QUESTRAN) 4 g packet DISSOLVE & TAKE 1 POWDER BY MOUTH TWICE DAILY (Patient taking differently: Take 4 g by mouth 2 (two) times daily. ) 60 each 2  . clopidogrel (PLAVIX) 75 MG tablet Take 1 tablet (75 mg total) by mouth daily. 30 tablet 2  . dicyclomine (BENTYL) 20 MG tablet Take 20 mg by mouth 2 (two) times daily.    . ergocalciferol (VITAMIN D2) 50000 UNITS capsule Take 50,000 Units by mouth every Thursday.     . estradiol (ESTRACE) 2 MG tablet Take 2 mg by mouth daily.    Marland Kitchen gabapentin (NEURONTIN) 300 MG capsule Take 300 mg by mouth 2 (two) times daily as needed (nerve pain).     Marland Kitchen glimepiride (AMARYL) 4 MG tablet Take 4 mg by mouth in the morning and at bedtime.     Marland Kitchen levothyroxine (SYNTHROID) 175 MCG tablet Take 175 mcg by mouth daily before breakfast.    . loperamide (IMODIUM A-D) 2 MG tablet Take 4 mg by mouth 3 (three) times daily as needed for diarrhea or loose stools.    . metoprolol tartrate (LOPRESSOR) 25 MG tablet Take 1 tablet (25 mg total) by mouth 2 (two) times daily. 60 tablet 3  . Multiple Vitamin (MULTIVITAMIN) tablet Take 1 tablet by mouth daily.    Marland Kitchen  ondansetron (ZOFRAN) 4 MG tablet Take 1 tablet (4 mg total) by mouth every 6 (six) hours as needed for nausea or vomiting. 30 tablet 1  . pioglitazone (ACTOS) 15 MG tablet Take 15 mg by mouth daily.    . Potassium Chloride ER 20 MEQ TBCR Take 20 mEq by mouth 2 (two) times daily.     . promethazine (PHENERGAN) 25 MG suppository Place 1 suppository (25 mg total) rectally every 8 (eight) hours as needed for nausea or vomiting. 6 each 0  . nitroGLYCERIN (NITROSTAT) 0.4 MG SL tablet Place 1 tablet (0.4 mg total) under the tongue every 5 (five) minutes as needed for chest pain. 30 tablet 2    Cardiovascular and other pertinent  studies:  EKG 02/24/2020: Sinus rhythm 76 bpm.  Early R wave transition Nonspecific ST-T changes.   Coronary angiography and attempted intervention 02/15/2020: LM: Short, normal. LAD: Small to medium caliber vessel with prox-mid, eccentric 80% stenosis with moderate calcification.          Occluded calcific Diag 1, likely originating at the site of LAD stenosis.         Under filled apical LAD         Left-to-left (LCx-LAD/Diag) and right-to-left (RCA-LAD/Diag) collaterals LCx: Large vessel. Prox 30% stenosis. RCA: Ostial, eccentric 80% stenosis with moderate calcification.   LVEDP normal.  Attempted LAD/RCA wiring. Intervention aborted due to poor guide support and difficulty wiring through radial access.   Findings discussed with the patient and husband (over the phone). I will further discuss revascularization options with the patient, including 2 vessel CABG vs atherectomy + PCI through femoral access. If the patient choses the latter, will stage this on anotehr day.   Continue Aspirin/plavix, lipitor, metoprolol for now.  Echocardiogram 02/10/2020: Left ventricle cavity is normal in size. Moderate concentric hypertrophy of the left ventricle. Normal global wall motion. Normal LV systolic function with visual EF 55-60%. Diastolic function not assessed due to ventricular bigeminy.  Left atrial cavity is mildly dilated. Mild to moderate mitral regurgitation. IVC is dilated with respiratory variation. Estimated RA pressure 8 mmHg.  Lexiscan Tetrofosmin stress test 02/09/2020: Stress EKG is non-diagnostic, as this is pharmacological stress test. Rest and stress EKG show sinus rhythm with ventricular bigeminy/trigeminy. Small size, mild intensity, reversible perfusion defect in basal anteroseptal myocardium. Stress LVEF 49%. Intermediate risk stress test.  CT Cardiac scoring 02/01/2020: 1. Calcium score of 868. This is between the 90th and 100 percentile for females between the ages  of 2 and 2. This is consistent with extensive atherosclerotic plaque. There is high likelihood of at least one significant coronary artery narrowing.  Recent labs: 01/24/2020: Glucose 87, BUN/Cr 19/0.87. EGFR >=60. Na/K 139/5.0. Rest of the CMP normal H/H 15.1/43.4. MCV 94.8. Platelets 227 HbA1C 5.7 %  Review of Systems  Cardiovascular: Positive for dyspnea on exertion. Negative for chest pain, leg swelling, palpitations and syncope.  Respiratory: Positive for shortness of breath.   Neurological: Positive for light-headedness.         Vitals:   02/29/20 0934  BP: 120/71  Pulse: (!) 50  Resp: 15  Temp: (!) 97.1 F (36.2 C)  SpO2: 98%     Body mass index is 28.57 kg/m. Filed Weights   02/29/20 0934  Weight: 80.3 kg     Objective:   Physical Exam  Constitutional: She appears well-developed and well-nourished.  Neck: No JVD present.  Cardiovascular: Normal rate, regular rhythm, normal heart sounds and intact distal pulses.  No murmur  heard. Pulmonary/Chest: Effort normal and breath sounds normal. She has no wheezes. She has no rales.  Musculoskeletal:        General: No edema.  Nursing note and vitals reviewed.       Assessment & Recommendations:   69 y.o. Caucasian female w/CAD with angina equivalent, ventricular bigeminy, former smoker, family h/o CAD.  CAD: Severe calcific proximal LAD and ostial RCA disease. Plan for femoral access atherectomy and intervention.  Continue aspirin, Plavix, atorvastatin 40 mg, metoprolol tartrate 25 mg daily, amlodipine 2.5 mg daily.  We discussed regarding risks, benefits, alternatives to this including continued medical therapy or CABG. We specifically discussed risks related to PCI, atherectomy, fmoral access, inclufing but not limited to, 3-4% risk of death, stroke, MI, coronary dissection, perforation, urgent CABG, bleeding, infection, renal failure but not limited to these.  Ventricular bigeminy: Improved today. If  persists after successful coronary intervention, could consider further EP evaluation.  For now, continue above medical therapy.  Nigel Mormon, MD [facility] [place] Pager: [phone] Office: [phone]

## 2020-02-29 NOTE — Interval H&P Note (Signed)
History and Physical Interval Note:  02/29/2020 11:17 AM  Jessica [name]  has presented today for surgery, with the diagnosis of cad.  The various methods of treatment have been discussed with the patient and family. After consideration of risks, benefits and other options for treatment, the patient has consented to  Procedure(s): CORONARY ATHERECTOMY (N/A) as a surgical intervention.  The patient's history has been reviewed, patient examined, no change in status, stable for surgery.  I have reviewed the patient's chart and labs.  Questions were answered to the patient's satisfaction.    2016/2017 Appropriate Use Criteria for Coronary Revascularization Clinical Presentation: Diabetes Mellitus? Symptom Status? S/P CABG? Antianginal Therapy (# of long-acting drugs)? Results of Non-invasive testing? FFR/iFR results in all diseased vessels? Patient undergoing renal transplant? Patient undergoing percutaneous valve procedure (TAVR, MitraClip, Others)? Symptom Status:  Ischemic Symptoms  Non-invasive Testing:  High risk  If no or indeterminate stress test, FFR/iFR results in all diseased vessels:  N/A  Diabetes Mellitus:  Yes  S/P CABG:  No  Antianginal therapy (number of long-acting drugs):  >=2  Patient undergoing renal transplant:  No  Patient undergoing percutaneous valve procedure:  No    newline 1 Vessel Disease PCI CABG  No proximal LAD involvement, No proximal left dominant LCX involvement A (8); Indication 2 M (6); Indication 2   Proximal left dominant LCX involvement A (8); Indication 5 A (8); Indication 5   Proximal LAD involvement A (8); Indication 5 A (8); Indication 5   newline 2 Vessel Disease  No proximal LAD involvement A (8); Indication 8 A (7); Indication 8   Proximal LAD involvement A (8); Indication 14 A (9); Indication 14   newline 3 Vessel Disease  Low disease complexity (e.g., focal stenoses, SYNTAX <=22) A (7); Indication 19 A (9); Indication 19   Intermediate or  high disease complexity (e.g., SYNTAX >=23) M (6); Indication 23 A (9); Indication 23   newline Left Main Disease  Isolated LMCA disease: ostial or midshaft A (7); Indication 24 A (9); Indication 24   Isolated LMCA disease: bifurcation involvement M (6); Indication 25 A (9); Indication 25   LMCA ostial or midshaft, concurrent low disease burden multivessel disease (e.g., 1-2 additional focal stenoses, SYNTAX <=22) A (7); Indication 26 A (9); Indication 26   LMCA ostial or midshaft, concurrent intermediate or high disease burden multivessel disease (e.g., 1-2 additional bifurcation stenoses, long stenoses, SYNTAX >=23) M (4); Indication 27 A (9); Indication 27   LMCA bifurcation involvement, concurrent low disease burden multivessel disease (e.g., 1-2 additional focal stenoses, SYNTAX <=22) M (6); Indication 28 A (9); Indication 28   LMCA bifurcation involvement, concurrent intermediate or high disease burden multivessel disease (e.g., 1-2 additional bifurcation stenoses, long stenoses, SYNTAX >=23) R (3); Indication 29 A (9); Indication [place]

## 2020-03-01 ENCOUNTER — Other Ambulatory Visit: Payer: Self-pay

## 2020-03-01 DIAGNOSIS — I10 Essential (primary) hypertension: Secondary | ICD-10-CM | POA: Diagnosis not present

## 2020-03-01 DIAGNOSIS — R008 Other abnormalities of heart beat: Secondary | ICD-10-CM | POA: Diagnosis not present

## 2020-03-01 DIAGNOSIS — Z79899 Other long term (current) drug therapy: Secondary | ICD-10-CM | POA: Diagnosis not present

## 2020-03-01 DIAGNOSIS — Z87891 Personal history of nicotine dependence: Secondary | ICD-10-CM | POA: Diagnosis not present

## 2020-03-01 DIAGNOSIS — E119 Type 2 diabetes mellitus without complications: Secondary | ICD-10-CM | POA: Diagnosis not present

## 2020-03-01 DIAGNOSIS — Z885 Allergy status to narcotic agent status: Secondary | ICD-10-CM | POA: Diagnosis not present

## 2020-03-01 DIAGNOSIS — Z7984 Long term (current) use of oral hypoglycemic drugs: Secondary | ICD-10-CM | POA: Diagnosis not present

## 2020-03-01 DIAGNOSIS — K3184 Gastroparesis: Secondary | ICD-10-CM | POA: Diagnosis not present

## 2020-03-01 DIAGNOSIS — Z7982 Long term (current) use of aspirin: Secondary | ICD-10-CM | POA: Diagnosis not present

## 2020-03-01 DIAGNOSIS — I25118 Atherosclerotic heart disease of native coronary artery with other forms of angina pectoris: Secondary | ICD-10-CM | POA: Diagnosis not present

## 2020-03-01 DIAGNOSIS — Z79890 Hormone replacement therapy (postmenopausal): Secondary | ICD-10-CM | POA: Diagnosis not present

## 2020-03-01 DIAGNOSIS — I2584 Coronary atherosclerosis due to calcified coronary lesion: Secondary | ICD-10-CM | POA: Diagnosis not present

## 2020-03-01 DIAGNOSIS — Z7902 Long term (current) use of antithrombotics/antiplatelets: Secondary | ICD-10-CM | POA: Diagnosis not present

## 2020-03-01 DIAGNOSIS — R9439 Abnormal result of other cardiovascular function study: Secondary | ICD-10-CM | POA: Diagnosis not present

## 2020-03-01 LAB — BASIC METABOLIC PANEL
Anion gap: 9 (ref 5–15)
BUN: 7 mg/dL — ABNORMAL LOW (ref 8–23)
CO2: 21 mmol/L — ABNORMAL LOW (ref 22–32)
Calcium: 7.7 mg/dL — ABNORMAL LOW (ref 8.9–10.3)
Chloride: 108 mmol/L (ref 98–111)
Creatinine, Ser: 0.55 mg/dL (ref 0.44–1.00)
GFR calc Af Amer: >60 mL/min (ref >60)
GFR calc non Af Amer: >60 mL/min (ref >60)
Glucose, Bld: 120 mg/dL — ABNORMAL HIGH (ref 70–99)
Potassium: 3.8 mmol/L (ref 3.5–5.1)
Sodium: 138 mmol/L (ref 135–145)

## 2020-03-01 LAB — CBC
HCT: 32.8 % — ABNORMAL LOW (ref 36.0–46.0)
Hemoglobin: 11.2 g/dL — ABNORMAL LOW (ref 12.0–15.0)
MCH: 32.6 pg (ref 26.0–34.0)
MCHC: 34.1 g/dL (ref 30.0–36.0)
MCV: 95.3 fL (ref 80.0–100.0)
Platelets: 250 10*3/uL (ref 150–400)
RBC: 3.44 MIL/uL — ABNORMAL LOW (ref 3.87–5.11)
RDW: 12.3 % (ref 11.5–15.5)
WBC: 11.3 10*3/uL — ABNORMAL HIGH (ref 4.0–10.5)
nRBC: 0 % (ref 0.0–0.2)

## 2020-03-01 LAB — GLUCOSE, CAPILLARY: Glucose-Capillary: 108 mg/dL — ABNORMAL HIGH (ref 70–99)

## 2020-03-01 MED FILL — Nitroglycerin IV Soln 100 MCG/ML in D5W: INTRA_ARTERIAL | Qty: 10 | Status: AC

## 2020-03-01 NOTE — Progress Notes (Signed)
CARDIAC REHAB PHASE I   PRE:  Rate/Rhythm: 101 ST PVCs pairs and bigeminy  BP:  Supine:   Sitting: 130/71  Standing:    SaO2: 98%RA   MODE:  Ambulation: 60 ft   POST:  Rate/Rhythm: 112 ST  BP:  Supine: 137/90  Sitting:   Standing:    SaO2: 98%RA 0825-0940 Pt walked 60 ft on RA with rolling walker and asst x 1. She cut walk short due to nausea. Had received nausea med earlier. Pt stated she has standard walker and cane at home and her husband would be with her. No c/o CP during walk just nausea. To BSC prior to walk and then to bed after walk. Gave gingerale. Gave pt heart healthy and diabetic diets for her to have. She stated she can only eat soft foods since bottom teeth removed. Reviewed NTG use and plavix. Pt tearful as she stated she may need OHS in future. She would shake at times. Very nervous. Discussed with pt that Cardiac Rehab will see her in future whether she has OHS or intervention. Emotional support given. Dr Virgina Jock in to see pt and stated that he would refer to CRP 2 when appropriate as pt needs further intervention or surgery. Treatment to be discussed in followup appointment.     Graylon Good, RN BSN  03/01/2020 9:34 AM

## 2020-03-01 NOTE — Discharge Summary (Signed)
Physician Discharge Summary  Patient ID: Jessica [name] MRN: [id number] DOB/AGE: [birth year] 69 y.o.  Admit date: 02/29/2020 Discharge date: 03/01/2020  Primary Discharge Diagnosis: Coronary artery disease Probable gastroparesis  Secondary Discharge Diagnosis: Hypertension Type 2 diabetes mellitus   Hospital Course:   69 y.o. Caucasian female w/CAD with angina equivalent, ventricular bigeminy, former smoker, family h/o CAD.  Patient was admitted for coronary intervention on 02/29/2020.  Partially successful PTCA performed to ostial right coronary artery, details below.  Patient had small hematoma in the right groin, which resolved with additional manual pressure with no residual impact.  Patient had significant nausea vomiting rest of the hospital stay.  On further history taking, it appears that patient has had this problem for last 2 years off and on.  She was briefly treated for gastroparesis, but currently not on treatment for the same.  Patient symptoms improved with IV Phenergan.  Patient stayed hemodynamically, electrically stable with no recurrence of chest pain and shortness of breath.  She was discharged on 03/01/2020.   Discharge Exam: Blood pressure 130/71, pulse 89, temperature 98.2 F (36.8 C), temperature source Oral, resp. rate 15, height 5\' 6"  (1.676 m), weight 81.2 kg, SpO2 98 %.   Physical Exam  Constitutional: She appears well-developed and well-nourished.  Neck: No JVD present.  Cardiovascular: Normal rate, regular rhythm, normal heart sounds and intact distal pulses.  No murmur heard. Right groin mild ecchymosis, no hematoma.  Pulmonary/Chest: Effort normal and breath sounds normal. She has no wheezes. She has no rales.  Musculoskeletal:        General: No edema.  Nursing note and vitals reviewed.    Significant Diagnostic Studies:  EKG 02/29/2020: Sinus rhythm, frequent PVCs.  Nonspecific ST-T abnormality. No significant change compared to previous  EKGs.  Coronary intervention 02/29/2020: Partially successful PTCA to ostial RCA 60% residual, severe calcific stenosis in ostial RCA.  Coronary angiogram (02/15/2020): LM: Short, normal. LAD: Small to medium caliber vessel with prox-mid, eccentric 80% stenosis with moderate calcification.  Occluded calcific Diag 1, likely originating at the site of LAD stenosis. Under filled apical LAD Left-to-left (LCx-LAD/Diag) and right-to-left (RCA-LAD/Diag) collaterals LCx: Large vessel. Prox 30% stenosis.  Recommend further discussion regarding options of two-vessel CABG or another attempt by another interventionalist.   Echocardiogram 02/10/2020: Left ventricle cavity is normal in size. Moderate concentric hypertrophy of the left ventricle. Normal global wall motion. Normal LV systolic function with visual EF 55-60%. Diastolic function not assessed due to ventricular bigeminy.  Left atrial cavity is mildly dilated. Mild to moderate mitral regurgitation. IVC is dilated with respiratory variation. Estimated RA pressure 8 mmHg.  Lexiscan Tetrofosmin stress test 02/09/2020: Stress EKG is non-diagnostic, as this is pharmacological stress test. Rest and stress EKG show sinus rhythm with ventricular bigeminy/trigeminy. Small size, mild intensity, reversible perfusion defect in basal anteroseptal myocardium. Stress LVEF 49%. Intermediate risk stress test.  CT Cardiac scoring 02/01/2020: 1. Calcium score of 868. This is between the 90th and 100 percentile for females between the ages of 5 and 21. This is consistent with extensive atherosclerotic plaque. There is high likelihood of at least one significant coronary artery narrowing.   Labs:   Lab Results  Component Value Date   WBC 11.3 (H) 03/01/2020   HGB 11.2 (L) 03/01/2020   HCT 32.8 (L) 03/01/2020   MCV 95.3 03/01/2020   PLT 250 03/01/2020    Recent Labs  Lab 03/01/20 0223  NA 138  K 3.8  CL 108  CO2 21*  BUN 7*  CREATININE 0.55  CALCIUM 7.7*  GLUCOSE 120*     HEMOGLOBIN A1C Lab Results  Component Value Date   HGBA1C 5.5 02/29/2020   MPG 111.15 02/29/2020    Cardiac Panel (last 3 results) No results for input(s): CKTOTAL, CKMB, TROPONINI, RELINDX in the last 8760 hours.  No results found for: CKTOTAL, CKMB, CKMBINDEX, TROPONINI   TSH No results for input(s): TSH in the last 8760 hours.  Radiology: CARDIAC CATHETERIZATION  Result Date: 02/29/2020 Partially successful PTCA to ostial RCA 60% residual, severe calcific stenosis in ostial RCA. Coronary angiogram (02/15/2020): LM: Short, normal. LAD: Small to medium caliber vessel with prox-mid, eccentric 80% stenosis with moderate calcification.         Occluded calcific Diag 1, likely originating at the site of LAD stenosis.         Under filled apical LAD         Left-to-left (LCx-LAD/Diag) and right-to-left (RCA-LAD/Diag) collaterals LCx: Large vessel. Prox 30% stenosis. Recommend further discussion regarding options of two-vessel CABG or another attempt by another interventionalist. Nigel Mormon, MD [facility] [place] Pager: [phone] Office: [phone]    CARDIAC CATHETERIZATION  Result Date: 02/15/2020 LM: Short, normal. LAD: Small to medium caliber vessel with prox-mid, eccentric 80% stenosis with moderate calcification.         Occluded calcific Diag 1, likely originating at the site of LAD stenosis.         Under filled apical LAD         Left-to-left (LCx-LAD/Diag) and right-to-left (RCA-LAD/Diag) collaterals LCx: Large vessel. Prox 30% stenosis. RCA: Ostial, eccentric 80% stenosis with moderate calcification.  LVEDP normal. Attempted LAD/RCA wiring. Intervention aborted due to poor guide support and difficulty wiring through radial access. Findings discussed with the patient and husband (over the phone). I will further discuss revascularization options with the patient, including 2 vessel CABG vs atherectomy +  PCI through femoral access. If the patient choses the latter, will stage this on anotehr day. Continue Aspirin/plavix, lipitor, metoprolol for now. Nigel Mormon, MD [facility] [place] Pager: [phone] Office: [phone]    Cardiac event monitor  Result Date: 02/17/2020 24 hr Holter monitor 02/11/2020: Dominant rhythm sinus. HR 53-122 bpm. Avg HR 74 bpm. Frequent PVC's in the form of isolated VE, bigeminy, trigeminy, rare NSVT up to 7 beats. Ventricular ectopy 24.6% Rare supraventricular ectopy. No atrial fibrillation/atrial flutter/SVT/VT/high grade AV block, sinus pause >3sec noted. Symptoms reported: None   PCV ECHOCARDIOGRAM COMPLETE  Result Date: 02/10/2020 Echocardiogram 02/10/2020: Left ventricle cavity is normal in size. Moderate concentric hypertrophy of the left ventricle. Normal global wall motion. Normal LV systolic function with visual EF 55-60%. Diastolic function not assessed due to ventricular bigeminy. Left atrial cavity is mildly dilated. Mild to moderate mitral regurgitation. IVC is dilated with respiratory variation. Estimated RA pressure 8 mmHg.  PCV MYOCARDIAL PERFUSION WITH LEXISCAN  Result Date: 02/10/2020 Lexiscan Tetrofosmin stress test 02/09/2020: Stress EKG is non-diagnostic, as this is pharmacological stress test. Rest and stress EKG show sinus rhythm with ventricular bigeminy/trigeminy. Small size, mild intensity, reversible perfusion defect in basal anteroseptal myocardium. Stress LVEF 49%. Intermediate risk stress test.     FOLLOW UP PLANS AND APPOINTMENTS Discharge Instructions    Diet - low sodium heart healthy   Complete by: As directed    Increase activity slowly   Complete by: As directed      Allergies as of 03/01/2020      Reactions   Adhesive [tape] Itching, Rash,  Other (See Comments)   Paper tape okay   Latex Rash   Oxycodone    Loopy, felt sick   Remicade [infliximab] Itching      Medication List    TAKE these medications    acetaminophen 500 MG tablet Commonly known as: TYLENOL Take 500 mg by mouth every 6 (six) hours as needed for moderate pain or headache.   ALPRAZolam 0.5 MG tablet Commonly known as: XANAX Take 0.5 mg by mouth 3 (three) times daily as needed for anxiety.   amLODipine 2.5 MG tablet Commonly known as: NORVASC Take 1 tablet (2.5 mg total) by mouth daily.   aspirin EC 81 MG tablet Take 1 tablet (81 mg total) by mouth daily.   atorvastatin 40 MG tablet Commonly known as: LIPITOR Take 1 tablet (40 mg total) by mouth daily at 6 PM.   butalbital-acetaminophen-caffeine 50-325-40 MG tablet Commonly known as: FIORICET Take 1 tablet by mouth 3 (three) times daily as needed for headache or migraine.   cholestyramine 4 g packet Commonly known as: QUESTRAN DISSOLVE & TAKE 1 POWDER BY MOUTH TWICE DAILY What changed: See the new instructions.   clopidogrel 75 MG tablet Commonly known as: Plavix Take 1 tablet (75 mg total) by mouth daily.   dicyclomine 20 MG tablet Commonly known as: BENTYL Take 20 mg by mouth 2 (two) times daily.   ergocalciferol 1.25 MG (50000 UT) capsule Commonly known as: VITAMIN D2 Take 50,000 Units by mouth every Thursday.   estradiol 2 MG tablet Commonly known as: ESTRACE Take 2 mg by mouth daily.   gabapentin 300 MG capsule Commonly known as: NEURONTIN Take 300 mg by mouth 2 (two) times daily as needed (nerve pain).   glimepiride 4 MG tablet Commonly known as: AMARYL Take 4 mg by mouth in the morning and at bedtime.   levothyroxine 175 MCG tablet Commonly known as: SYNTHROID Take 175 mcg by mouth daily before breakfast.   loperamide 2 MG tablet Commonly known as: IMODIUM A-D Take 4 mg by mouth 3 (three) times daily as needed for diarrhea or loose stools.   metoprolol tartrate 25 MG tablet Commonly known as: LOPRESSOR Take 1 tablet (25 mg total) by mouth 2 (two) times daily.   multivitamin tablet Take 1 tablet by mouth daily.   nitroGLYCERIN  0.4 MG SL tablet Commonly known as: NITROSTAT Place 1 tablet (0.4 mg total) under the tongue every 5 (five) minutes as needed for chest pain.   ondansetron 4 MG tablet Commonly known as: ZOFRAN Take 1 tablet (4 mg total) by mouth every 6 (six) hours as needed for nausea or vomiting.   pantoprazole 40 MG tablet Commonly known as: PROTONIX Take 1 tablet (40 mg total) by mouth daily.   pioglitazone 15 MG tablet Commonly known as: ACTOS Take 15 mg by mouth daily.   Potassium Chloride ER 20 MEQ Tbcr Take 20 mEq by mouth 2 (two) times daily.   promethazine 25 MG suppository Commonly known as: PHENERGAN Place 1 suppository (25 mg total) rectally every 8 (eight) hours as needed for nausea or vomiting.         Vernell Leep MD, [facility] Pager: [phone] Office: [phone] If no answer: [phone]

## 2020-03-01 NOTE — Progress Notes (Signed)
Pt's stable, DC home via wheelchair, office of Dr Alphonse Guild will call to make appt.

## 2020-03-17 ENCOUNTER — Encounter: Payer: Self-pay | Admitting: Cardiology

## 2020-03-17 ENCOUNTER — Other Ambulatory Visit: Payer: Self-pay

## 2020-03-17 ENCOUNTER — Ambulatory Visit: Payer: Medicare Other | Admitting: Cardiology

## 2020-03-17 VITALS — BP 130/79 | HR 47 | Ht 66.0 in | Wt 174.0 lb

## 2020-03-17 DIAGNOSIS — R931 Abnormal findings on diagnostic imaging of heart and coronary circulation: Secondary | ICD-10-CM

## 2020-03-17 DIAGNOSIS — R0600 Dyspnea, unspecified: Secondary | ICD-10-CM

## 2020-03-17 DIAGNOSIS — I498 Other specified cardiac arrhythmias: Secondary | ICD-10-CM

## 2020-03-17 DIAGNOSIS — R0609 Other forms of dyspnea: Secondary | ICD-10-CM | POA: Diagnosis not present

## 2020-03-17 DIAGNOSIS — I25118 Atherosclerotic heart disease of native coronary artery with other forms of angina pectoris: Secondary | ICD-10-CM

## 2020-03-17 MED ORDER — RANOLAZINE ER 500 MG PO TB12: 500.0000 mg | ORAL_TABLET | Freq: Two times a day (BID) | ORAL | 2 refills | Status: DC

## 2020-03-17 NOTE — Progress Notes (Signed)
Patient referred by Jani Gravel, MD for  exertional dyspnea, dizziness.  Subjective:   Jessica [name], female    DOB: [birth year], 69 y.o.   MRN: [id number]   Chief Complaint  Patient presents with  . Coronary Artery Disease    post cath  . Follow-up    69 y.o. Caucasian female w/CAD with angina equivalent, ventricular bigeminy, former smoker, family h/o CAD.  Work-up showed significantly elevated calcium score, mild ischemia on stress test with mild reduced stress LVEF. EF was normal on echocardiogram.  Coronary angiogram showed severe proximal LAD and ostial RCA calcific disease.  Attempted intervention through radial and femoral access was unsuccessful due to difficult anatomy.   Patient is here today with her sister Webb Silversmith.  Patient is in emotional distress owing to her own symptoms of chest pain shortness of breath has been getting admitted to hospital last night with back pain.  Patient is inconsolable at times, but able to calm down after listening to me and her sister.  Patient states that her physical activity is severely limited due to shortness of breath, and occasional chest pain.  She is not able to walk beyond primary walks due to her symptoms.  Current Outpatient Medications on File Prior to Visit  Medication Sig Dispense Refill  . acetaminophen (TYLENOL) 500 MG tablet Take 500 mg by mouth every 6 (six) hours as needed for moderate pain or headache.    . ALPRAZolam (XANAX) 0.5 MG tablet Take 0.5 mg by mouth 3 (three) times daily as needed for anxiety.     Marland Kitchen amLODipine (NORVASC) 2.5 MG tablet Take 1 tablet (2.5 mg total) by mouth daily. 30 tablet 2  . aspirin EC 81 MG tablet Take 1 tablet (81 mg total) by mouth daily. 90 tablet 3  . atorvastatin (LIPITOR) 40 MG tablet Take 1 tablet (40 mg total) by mouth daily at 6 PM. 90 tablet 2  . butalbital-acetaminophen-caffeine (FIORICET, ESGIC) 50-325-40 MG per tablet Take 1 tablet by mouth 3 (three) times daily as needed for headache  or migraine.     . cholestyramine (QUESTRAN) 4 g packet DISSOLVE & TAKE 1 POWDER BY MOUTH TWICE DAILY (Patient taking differently: Take 4 g by mouth 2 (two) times daily. ) 60 each 2  . clopidogrel (PLAVIX) 75 MG tablet Take 1 tablet (75 mg total) by mouth daily. 30 tablet 2  . dicyclomine (BENTYL) 20 MG tablet Take 20 mg by mouth 2 (two) times daily.    . ergocalciferol (VITAMIN D2) 50000 UNITS capsule Take 50,000 Units by mouth every Thursday.     . estradiol (ESTRACE) 2 MG tablet Take 2 mg by mouth daily.    Marland Kitchen gabapentin (NEURONTIN) 300 MG capsule Take 300 mg by mouth 2 (two) times daily as needed (nerve pain).     Marland Kitchen glimepiride (AMARYL) 4 MG tablet Take 4 mg by mouth in the morning and at bedtime.     Marland Kitchen levothyroxine (SYNTHROID) 175 MCG tablet Take 175 mcg by mouth daily before breakfast.    . loperamide (IMODIUM A-D) 2 MG tablet Take 4 mg by mouth 3 (three) times daily as needed for diarrhea or loose stools.    . metoprolol tartrate (LOPRESSOR) 25 MG tablet Take 1 tablet (25 mg total) by mouth 2 (two) times daily. 60 tablet 3  . Multiple Vitamin (MULTIVITAMIN) tablet Take 1 tablet by mouth daily.    . nitroGLYCERIN (NITROSTAT) 0.4 MG SL tablet Place 1 tablet (0.4 mg total) under the  tongue every 5 (five) minutes as needed for chest pain. 30 tablet 2  . ondansetron (ZOFRAN) 4 MG tablet Take 1 tablet (4 mg total) by mouth every 6 (six) hours as needed for nausea or vomiting. 30 tablet 1  . pantoprazole (PROTONIX) 40 MG tablet Take 1 tablet (40 mg total) by mouth daily. 30 tablet 2  . pioglitazone (ACTOS) 15 MG tablet Take 15 mg by mouth daily.    . Potassium Chloride ER 20 MEQ TBCR Take 20 mEq by mouth 2 (two) times daily.     . promethazine (PHENERGAN) 25 MG suppository Place 1 suppository (25 mg total) rectally every 8 (eight) hours as needed for nausea or vomiting. 6 each 0   Current Facility-Administered Medications on File Prior to Visit  Medication Dose Route Frequency Provider Last Rate  Last Admin  . 0.9 %  sodium chloride infusion  500 mL Intravenous Once Ladene Artist, MD        Cardiovascular and other pertinent studies:  Attempted coronary intervention 02/29/2020: Partially successful PTCA to ostial RCA 60% residual, severe calcific stenosis in ostial RCA.  Coronary angiogram (02/15/2020): LM: Short, normal. LAD: Small to medium caliber vessel with prox-mid, eccentric 80% stenosis with moderate calcification.  Occluded calcific Diag 1, likely originating at the site of LAD stenosis. Under filled apical LAD Left-to-left (LCx-LAD/Diag) and right-to-left (RCA-LAD/Diag) collaterals LCx: Large vessel. Prox 30% stenosis.  Recommend further discussion regarding options of two-vessel CABG or another attempt by another interventionalist.  EKG 02/24/2020: Sinus rhythm 76 bpm.  Early R wave transition Nonspecific ST-T changes.   Coronary angiography and attempted intervention 02/15/2020: LM: Short, normal. LAD: Small to medium caliber vessel with prox-mid, eccentric 80% stenosis with moderate calcification.          Occluded calcific Diag 1, likely originating at the site of LAD stenosis.         Under filled apical LAD         Left-to-left (LCx-LAD/Diag) and right-to-left (RCA-LAD/Diag) collaterals LCx: Large vessel. Prox 30% stenosis. RCA: Ostial, eccentric 80% stenosis with moderate calcification.   LVEDP normal.  Attempted LAD/RCA wiring. Intervention aborted due to poor guide support and difficulty wiring through radial access.   Findings discussed with the patient and husband (over the phone). I will further discuss revascularization options with the patient, including 2 vessel CABG vs atherectomy + PCI through femoral access. If the patient choses the latter, will stage this on anotehr day.   Continue Aspirin/plavix, lipitor, metoprolol for now.  Echocardiogram 02/10/2020: Left ventricle cavity is normal in size. Moderate  concentric hypertrophy of the left ventricle. Normal global wall motion. Normal LV systolic function with visual EF 55-60%. Diastolic function not assessed due to ventricular bigeminy.  Left atrial cavity is mildly dilated. Mild to moderate mitral regurgitation. IVC is dilated with respiratory variation. Estimated RA pressure 8 mmHg.  Lexiscan Tetrofosmin stress test 02/09/2020: Stress EKG is non-diagnostic, as this is pharmacological stress test. Rest and stress EKG show sinus rhythm with ventricular bigeminy/trigeminy. Small size, mild intensity, reversible perfusion defect in basal anteroseptal myocardium. Stress LVEF 49%. Intermediate risk stress test.  CT Cardiac scoring 02/01/2020: 1. Calcium score of 868. This is between the 90th and 100 percentile for females between the ages of 68 and 54. This is consistent with extensive atherosclerotic plaque. There is high likelihood of at least one significant coronary artery narrowing.  Recent labs: 01/24/2020: Glucose 87, BUN/Cr 19/0.87. EGFR >=60. Na/K 139/5.0. Rest of the CMP normal H/H 15.1/43.4.  MCV 94.8. Platelets 227 HbA1C 5.7 %  Review of Systems  Cardiovascular: Positive for dyspnea on exertion. Negative for chest pain, leg swelling, palpitations and syncope.  Respiratory: Positive for shortness of breath.   Neurological: Negative for light-headedness.  Psychiatric/Behavioral: The patient is nervous/anxious.          Vitals:   03/17/20 1444  BP: 130/79  Pulse: (!) 47  SpO2: 98%     Body mass index is 28.08 kg/m. Filed Weights   03/17/20 1444  Weight: 174 lb (78.9 kg)     Objective:   Physical Exam  Constitutional: She appears well-developed and well-nourished.  Neck: No JVD present.  Cardiovascular: Regular rhythm, normal heart sounds and intact distal pulses. Bradycardia present.  No murmur heard. Pulmonary/Chest: Effort normal and breath sounds normal. She has no wheezes. She has no rales.  Musculoskeletal:         General: No edema.  Nursing note and vitals reviewed.       Assessment & Recommendations:   69 y.o. Caucasian female w/CAD with angina equivalent, ventricular bigeminy, former smoker, family h/o CAD.  CAD: Severe calcific proximal LAD and ostial RCA disease. Partially successful ostial RCA PTCA.  Atherectomy and stenting could not be performed due to difficult anatomy. I discussed the following options with the patient regarding management of her complex CAD. #1: Continued medical management #2:  Referral for CABG #3:  Consultation with Dr. Einar Gip regarding attempt at percutaneous revascularization by him.  Patient would like to start Ranexa 500 mg twice daily, and has a consultation visit with Dr. Einar Gip. Arranged for 4/26 4:15 PM.  Ventricular bigeminy: If persists after successful coronary intervention, could consider further EP evaluation.  For now, continue above medical therapy.  Anxiety: Patient has severe anxiety issues. Recommend discussing with her PCP regarding management.  Nigel Mormon, MD [facility] [place] Pager: [phone] Office: [phone]

## 2020-03-21 ENCOUNTER — Telehealth: Payer: Self-pay

## 2020-03-21 ENCOUNTER — Other Ambulatory Visit: Payer: Self-pay

## 2020-03-21 MED ORDER — AMLODIPINE BESYLATE 2.5 MG PO TABS: 2.5000 mg | ORAL_TABLET | Freq: Every day | ORAL | 1 refills | Status: DC

## 2020-03-21 MED ORDER — CLOPIDOGREL BISULFATE 75 MG PO TABS: 75.0000 mg | ORAL_TABLET | Freq: Every day | ORAL | 1 refills | Status: DC

## 2020-03-24 NOTE — Progress Notes (Signed)
Primary Physician/Referring:  Jani Gravel, MD  Patient ID: Jessica [name], female    DOB: Feb 13, 1951, 69 y.o.   MRN: [id number]  Chief Complaint  Patient presents with  . Coronary Artery Disease  . Chest Pain  . Follow-up    2 week   HPI:    Jessica [name]  is a 69 y.o. Caucasian female w/CAD with angina equivalen, ventricular bigeminy and dyspnea on exertion,  former smoker, family h/o CAD with prox LAC 50-60% and high grade ostial RCA calcific disease.  Attempted intervention through radial and femoral access was unsuccessful due to difficult anatomy.  Her past medical history significant for hypertension, hyperlipidemia, diabetes mellitus, severe degenerative disc disease presents for f/u of CAD.  Initial diagnostic angiography on 02/15/2020 and attempted angioplasty was aborted and attempt scratch that second attempt on 02/29/2020 was again aborted due to poor guide support.  She is accompanied by her husband, states that she has not had any chest pain but has dyspnea on exertion that is chronic and also chronic back pain.  She has been extremely anxious of this visit as she does not want to have CABG.  Past Medical History:  Diagnosis Date  . Allergy   . Anxiety   . Asthma   . Cervical stenosis of spine   . COPD (chronic obstructive pulmonary disease) ([place])   . DDD (degenerative disc disease)   . Depression   . Diabetes mellitus without complication ([place])   . Fatty liver   . Gastric ulcer   . GERD (gastroesophageal reflux disease)   . Heart murmur   . Hyperglycemia   . Hyperlipidemia   . Hypothyroidism   . Irregular heartbeat   . Migraines   . Multiple gastric ulcers   . Obesity   . Osteoarthritis   . Rheumatoid arthritis ([place])   . Sciatica   . Scoliosis   . Vitamin D deficiency    Past Surgical History:  Procedure Laterality Date  . ANTERIOR CERVICAL DECOMP/DISCECTOMY FUSION  12/08/2017   Procedure: Anterior Cervical Decompression/Discectomy Fusion - Cervical  four-Cervical five - Cervical five-Cervical six - Cervical six-Cervical seven;  Surgeon: Earnie Larsson, MD;  Location: [facility];  Service: Neurosurgery;;  . APPENDECTOMY    . CHOLECYSTECTOMY  2010  . COLONOSCOPY    . COLOSTOMY     removed 3 inches of colon 1987  . 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 CORONARY BALLOON ANGIOPLASTY N/A 02/29/2020   Procedure: CORONARY BALLOON ANGIOPLASTY;  Surgeon: Nigel Mormon, MD;  Location: [facility];  Service: Cardiovascular;  Laterality: N/A;  . HAND SURGERY Right 2006  . INTRAVASCULAR ULTRASOUND/IVUS N/A 02/29/2020   Procedure: Intravascular Ultrasound/IVUS;  Surgeon: Nigel Mormon, MD;  Location: [facility];  Service: Cardiovascular;  Laterality: N/A;  . LEFT HEART CATH N/A 02/29/2020   Procedure: Left Heart Cath;  Surgeon: Nigel Mormon, MD;  Location: [facility];  Service: Cardiovascular;  Laterality: N/A;  . LEFT HEART CATH AND CORONARY ANGIOGRAPHY N/A 02/15/2020   Procedure: LEFT HEART CATH AND CORONARY ANGIOGRAPHY;  Surgeon: Nigel Mormon, MD;  Location: [facility];  Service: Cardiovascular;  Laterality: N/A;  . Perforated Colon  1986   by Dr. Margot Chimes  . POLYPECTOMY    . TOTAL ABDOMINAL HYSTERECTOMY  1989   Dr. Nori Riis  . UPPER GASTROINTESTINAL ENDOSCOPY    . UPPER GI ENDOSCOPY  Family History  Problem Relation Age of Onset  . Heart attack Father   . Heart disease Mother   . Hypertension Mother   . Breast cancer Sister   . Diabetes Brother   . Stroke Brother   . Colon cancer Neg Hx   . Esophageal cancer Neg Hx   . Liver cancer Neg Hx   . Pancreatic cancer Neg Hx   . Rectal cancer Neg Hx   . Stomach cancer Neg Hx     Social History   Tobacco Use  . Smoking status: Former Smoker    Packs/day: 1.00    Years: 40.00    Pack years: 40.00    Types: Cigarettes    Quit date: 12/08/2017    Years since quitting: 2.3  . Smokeless tobacco:  Never Used  Substance Use Topics  . Alcohol use: No   Marital Status: Married  ROS  Review of Systems  Constitution: Positive for malaise/fatigue.  Cardiovascular: Positive for dyspnea on exertion. Negative for chest pain and leg swelling.  Musculoskeletal: Positive for arthritis, back pain, joint pain and neck pain.   Objective  Blood pressure (!) 145/75, pulse 63, temperature 97.9 F (36.6 C), temperature source Temporal, resp. rate 17, height 5' 6"  (1.676 m), weight 174 lb (78.9 kg), SpO2 100 %.  Vitals with BMI 03/27/2020 03/17/2020 03/01/2020  Height 5' 6"  5' 6"  -  Weight 174 lbs 174 lbs -  BMI 72.6 20.3 -  Systolic 559 741 638  Diastolic 75 79 71  Pulse 63 47 89     Physical Exam  Constitutional:  Appears older than stated age.   Cardiovascular: Regular rhythm and intact distal pulses. Bradycardia present. Exam reveals no gallop.  No murmur heard. No JVD. No pedal edema.  Pulmonary/Chest: Effort normal and breath sounds normal. No accessory muscle usage. No respiratory distress.  Abdominal: Soft. Bowel sounds are normal.   Laboratory examination:   Recent Labs    09/25/19 1041 09/25/19 1041 02/15/20 0606 02/29/20 1041 03/01/20 0223  NA 139   < > 141 138 138  K 4.1   < > 3.7 5.0 3.8  CL 102   < > 105 104 108  CO2 25  --  23  --  21*  GLUCOSE 164*   < > 102* 147* 120*  BUN 10   < > 13 8 7*  CREATININE 0.66   < > 0.70 0.50 0.55  CALCIUM 8.5*  --  8.8*  --  7.7*  GFRNONAA >60  --  >60  --  >60  GFRAA >60  --  >60  --  >60   < > = values in this interval not displayed.   CrCl cannot be calculated (Patient's most recent lab result is older than the maximum 21 days allowed.).  CMP Latest Ref Rng & Units 03/01/2020 02/29/2020 02/15/2020  Glucose 70 - 99 mg/dL 120(H) 147(H) 102(H)  BUN 8 - 23 mg/dL 7(L) 8 13  Creatinine 0.44 - 1.00 mg/dL 0.55 0.50 0.70  Sodium 135 - 145 mmol/L 138 138 141  Potassium 3.5 - 5.1 mmol/L 3.8 5.0 3.7  Chloride 98 - 111 mmol/L 108 104 105   CO2 22 - 32 mmol/L 21(L) - 23  Calcium 8.9 - 10.3 mg/dL 7.7(L) - 8.8(L)  Total Protein 6.5 - 8.1 g/dL - - -  Total Bilirubin 0.3 - 1.2 mg/dL - - -  Alkaline Phos 38 - 126 U/L - - -  AST 15 - 41 U/L - - -  ALT 0 - 44 U/L - - -   CBC Latest Ref Rng & Units 03/01/2020 02/29/2020 02/15/2020  WBC 4.0 - 10.5 K/uL 11.3(H) - 7.3  Hemoglobin 12.0 - 15.0 g/dL 11.2(L) 12.2 13.0  Hematocrit 36.0 - 46.0 % 32.8(L) 36.0 38.5  Platelets 150 - 400 K/uL 250 - 185   Lipid Panel  No results found for: CHOL, TRIG, HDL, CHOLHDL, VLDL, LDLCALC, LDLDIRECT HEMOGLOBIN A1C Lab Results  Component Value Date   HGBA1C 5.5 02/29/2020   MPG 111.15 02/29/2020   TSH No results for input(s): TSH in the last 8760 hours.  External labs:   01/24/2020: Glucose 87, BUN/Cr 19/0.87. EGFR >=60. Na/K 139/5.0. Rest of the CMP normal H/H 15.1/43.4. MCV 94.8. Platelets 227  Cholesterol, total 359.000 M 02/15/2019 Triglycerides 352.000 09/13/2019 HDL 53.000 M 02/15/2019 LDL 140.000 09/24/2017  Medications and allergies   Allergies  Allergen Reactions  . Adhesive [Tape] Itching, Rash and Other (See Comments)    Paper tape okay  . Latex Rash  . Oxycodone     Loopy, felt sick  . Remicade [Infliximab] Itching     Current Outpatient Medications  Medication Instructions  . acetaminophen (TYLENOL) 500 mg, Oral, Every 6 hours PRN  . ALPRAZolam (XANAX) 0.5 mg, Oral, 3 times daily PRN  . amLODipine (NORVASC) 5 mg, Oral, Daily  . aspirin EC 81 mg, Oral, Daily  . atorvastatin (LIPITOR) 40 mg, Oral, Daily-1800  . butalbital-acetaminophen-caffeine (FIORICET, ESGIC) 50-325-40 MG per tablet 1 tablet, Oral, 3 times daily PRN  . clopidogrel (PLAVIX) 75 mg, Oral, Daily  . dicyclomine (BENTYL) 20 mg, Oral, 2 times daily  . ergocalciferol (VITAMIN D2) 50,000 Units, Oral, Every Thu  . estradiol (ESTRACE) 2 mg, Oral, Daily  . gabapentin (NEURONTIN) 300 mg, Oral, 2 times daily PRN  . glimepiride (AMARYL) 4 mg, Oral, 2 times daily   . levothyroxine (SYNTHROID) 175 mcg, Oral, Daily before breakfast  . loperamide (IMODIUM A-D) 4 mg, Oral, 3 times daily PRN  . metoprolol tartrate (LOPRESSOR) 25 mg, Oral, 2 times daily  . Multiple Vitamin (MULTIVITAMIN) tablet 1 tablet, Oral, Daily  . nitroGLYCERIN (NITROSTAT) 0.4 mg, Sublingual, Every 5 min PRN  . pantoprazole (PROTONIX) 40 mg, Oral, Daily  . pioglitazone (ACTOS) 15 mg, Oral, Daily  . Potassium Chloride ER 20 MEQ TBCR 20 mEq, Oral, 2 times daily  . promethazine (PHENERGAN) 25 mg, Rectal, Every 8 hours PRN  . ranolazine (RANEXA) 500 mg, Oral, 2 times daily   Radiology:   CT Abdomen Pelvis W Contrast 02/20/2018: Age advanced atherosclerotic calcifications involving the aorta and iliac arteries.  CT Cardiac scoring 02/01/2020: 1. Calcium score of 868. This is between the 90th and 100 percentile for females between the ages of 54 and 32. This is consistent with extensive atherosclerotic plaque. There is high likelihood of at least one significant coronary artery narrowing.  Cardiac Studies:   Lexiscan Tetrofosmin stress test 02/09/2020: Stress EKG is non-diagnostic, as this is pharmacological stress test. Rest and stress EKG show sinus rhythm with ventricular bigeminy/trigeminy. Small size, mild intensity, reversible perfusion defect in basal anteroseptal myocardium. Stress LVEF 49%. Intermediate risk stress test.  Echocardiogram 02/10/2020: Left ventricle cavity is normal in size. Moderate concentric hypertrophy of the left ventricle. Normal global wall motion. Normal LV systolic function with visual EF 55-60%. Diastolic function not assessed due to ventricular bigeminy.  Left atrial cavity is mildly dilated. Mild to moderate mitral regurgitation. IVC is dilated with respiratory variation. Estimated RA pressure 8 mmHg.  Coronary  angiography and attempted intervention 02/15/2020: LM: Short, normal. LAD: Small to medium caliber vessel with prox-mid, eccentric 80% stenosis  with moderate calcification.  Occluded calcific Diag 1, likely originating at the site of LAD stenosis. Under filled apical LAD. Left-to-left (LCx-LAD/Diag) and right-to-left (RCA-LAD/Diag) collaterals LCx: Large vessel. Prox 30% stenosis. RCA: Ostial, eccentric 80% stenosis with moderate calcification. LVEDP normal.  Attempted LAD/RCA wiring. Intervention aborted due to poor guide support and difficulty wiring through radial access.   Attempted coronary intervention 02/29/2020: Partially successful PTCA to ostial RCA 60% residual, severe calcific stenosis in ostial RCA.   EKG  EKG 02/24/2020: Sinus rhythm 76 bpm. Early R wave transition. Nonspecific ST-T changes.  Assessment     ICD-10-CM   1. Coronary artery disease involving native coronary artery of native heart with other form of angina pectoris (HCC)  I25.118 amLODipine (NORVASC) 5 MG tablet  2. Ventricular bigeminy  I49.8   3. Mixed hyperlipidemia  E78.2 atorvastatin (LIPITOR) 40 MG tablet  4. Primary hypertension  I10      Meds ordered this encounter  Medications  . amLODipine (NORVASC) 5 MG tablet    Sig: Take 1 tablet (5 mg total) by mouth daily.    Dispense:  90 tablet    Refill:  1  . atorvastatin (LIPITOR) 40 MG tablet    Sig: Take 1 tablet (40 mg total) by mouth daily at 6 PM.    Dispense:  90 tablet    Refill:  2    Medications Discontinued During This Encounter  Medication Reason  . cholestyramine (QUESTRAN) 4 g packet Duplicate  . ondansetron (ZOFRAN) 4 MG tablet Change in therapy  . promethazine (PHENERGAN) 25 MG tablet Duplicate  . propranolol ER (INDERAL LA) 120 MG 24 hr capsule Change in therapy  . 0.9 %  sodium chloride infusion   . atorvastatin (LIPITOR) 40 MG tablet Reorder  . amLODipine (NORVASC) 2.5 MG tablet Reorder    Recommendations:   Jessica [name]  is a 69 y.o. Caucasian female w/CAD with angina equivalen, ventricular bigeminy and dyspnea on exertion,  former smoker, family h/o CAD with prox  LAD 2 to 70% and high grade ostial RCA calcific disease.  Attempted intervention through radial and femoral access was unsuccessful x 2 (02/15/2020 and 02/29/2020) due to difficult anatomy.  Her past medical history significant for hypertension, hyperlipidemia, diabetes mellitus, severe degenerative disc disease presents for f/u of CAD.  She still has limitation with regard to dyspnea on exertion which is related to underlying 40+ pack year history of smoking but anginal equivalency especially in view of high-grade ostial RCA stenosis cannot be excluded.  Would recommend attempted angioplasty to the right coronary artery.  I also discussed regarding medical therapy but patient states she would like to try one attempt at right coronary artery angioplasty, I reviewed the angiograms with the patient and her husband as well.  She is accompanied by her husband, states that she has not had any chest pain but has dyspnea on exertion that is chronic and also chronic back pain.  She has been extremely anxious of this visit as she does not want to have CABG. I reviewed the angiograms, although LAD stenosis may be significant, the whole artery is diffusely diseased and very small and barely reaches apex, circumflex being the most dominant of the 3 vessels.  We could certainly treat LAD disease medically, and I discussed with the patient and her husband regarding ARTS and Courage trial.  Her blood pressure is  elevated today, patient extremely anxious however will utilize this opportunity to add 5 mg of amlodipine increased from 2.5 mg for angina equivalent dyspnea.  She is planning on a trip to go to the beach this week, advised her the angioplasty is certainly not urgent.  I will try to set this up in the next 2 to 4 weeks.  Her lipids have not been well controlled, recently Lipitor has been increased from 20 mg to 40 mg, continue the same.  She has marked mixed hyperlipidemia, diet and weight loss discussed in detail.   Her physical activity is limited due to spinal disease.  She also has a 40+ pack year history of smoking cigarettes, she will need low-dose screening CT at some point.  Adrian Prows, MD, [facility] 03/27/2020, 6:54 PM [facility] [place] Office: [phone]

## 2020-03-24 NOTE — H&P (View-Only) (Signed)
Primary Physician/Referring:  Jani Gravel, MD  Patient ID: Jessica [name], female    DOB: August 13, 1951, 69 y.o.   MRN: [id number]  Chief Complaint  Patient presents with  . Coronary Artery Disease  . Chest Pain  . Follow-up    2 week   HPI:    Jessica [name]  is a 69 y.o. Caucasian female w/CAD with angina equivalen, ventricular bigeminy and dyspnea on exertion,  former smoker, family h/o CAD with prox LAC 50-60% and high grade ostial RCA calcific disease.  Attempted intervention through radial and femoral access was unsuccessful due to difficult anatomy.  Her past medical history significant for hypertension, hyperlipidemia, diabetes mellitus, severe degenerative disc disease presents for f/u of CAD.  Initial diagnostic angiography on 02/15/2020 and attempted angioplasty was aborted and attempt scratch that second attempt on 02/29/2020 was again aborted due to poor guide support.  She is accompanied by her husband, states that she has not had any chest pain but has dyspnea on exertion that is chronic and also chronic back pain.  She has been extremely anxious of this visit as she does not want to have CABG.  Past Medical History:  Diagnosis Date  . Allergy   . Anxiety   . Asthma   . Cervical stenosis of spine   . COPD (chronic obstructive pulmonary disease) ([place])   . DDD (degenerative disc disease)   . Depression   . Diabetes mellitus without complication ([place])   . Fatty liver   . Gastric ulcer   . GERD (gastroesophageal reflux disease)   . Heart murmur   . Hyperglycemia   . Hyperlipidemia   . Hypothyroidism   . Irregular heartbeat   . Migraines   . Multiple gastric ulcers   . Obesity   . Osteoarthritis   . Rheumatoid arthritis ([place])   . Sciatica   . Scoliosis   . Vitamin D deficiency    Past Surgical History:  Procedure Laterality Date  . ANTERIOR CERVICAL DECOMP/DISCECTOMY FUSION  12/08/2017   Procedure: Anterior Cervical Decompression/Discectomy Fusion - Cervical  four-Cervical five - Cervical five-Cervical six - Cervical six-Cervical seven;  Surgeon: Earnie Larsson, MD;  Location: [place];  Service: Neurosurgery;;  . APPENDECTOMY    . CHOLECYSTECTOMY  2010  . COLONOSCOPY    . COLOSTOMY     removed 3 inches of colon 1987  . 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 CORONARY BALLOON ANGIOPLASTY N/A 02/29/2020   Procedure: CORONARY BALLOON ANGIOPLASTY;  Surgeon: Nigel Mormon, MD;  Location: [facility];  Service: Cardiovascular;  Laterality: N/A;  . HAND SURGERY Right 2006  . INTRAVASCULAR ULTRASOUND/IVUS N/A 02/29/2020   Procedure: Intravascular Ultrasound/IVUS;  Surgeon: Nigel Mormon, MD;  Location: [facility] [place] [facility];  Service: Cardiovascular;  Laterality: N/A;  . LEFT HEART CATH N/A 02/29/2020   Procedure: Left Heart Cath;  Surgeon: Nigel Mormon, MD;  Location: [facility];  Service: Cardiovascular;  Laterality: N/A;  . LEFT HEART CATH AND CORONARY ANGIOGRAPHY N/A 02/15/2020   Procedure: LEFT HEART CATH AND CORONARY ANGIOGRAPHY;  Surgeon: Nigel Mormon, MD;  Location: [facility];  Service: Cardiovascular;  Laterality: N/A;  . Perforated Colon  1986   by Dr. Margot Chimes  . POLYPECTOMY    . TOTAL ABDOMINAL HYSTERECTOMY  1989   Dr. Nori Riis  . UPPER GASTROINTESTINAL ENDOSCOPY    . UPPER GI ENDOSCOPY  Family History  Problem Relation Age of Onset  . Heart attack Father   . Heart disease Mother   . Hypertension Mother   . Breast cancer Sister   . Diabetes Brother   . Stroke Brother   . Colon cancer Neg Hx   . Esophageal cancer Neg Hx   . Liver cancer Neg Hx   . Pancreatic cancer Neg Hx   . Rectal cancer Neg Hx   . Stomach cancer Neg Hx     Social History   Tobacco Use  . Smoking status: Former Smoker    Packs/day: 1.00    Years: 40.00    Pack years: 40.00    Types: Cigarettes    Quit date: 12/08/2017    Years since quitting: 2.3  . Smokeless tobacco:  Never Used  Substance Use Topics  . Alcohol use: No   Marital Status: Married  ROS  Review of Systems  Constitution: Positive for malaise/fatigue.  Cardiovascular: Positive for dyspnea on exertion. Negative for chest pain and leg swelling.  Musculoskeletal: Positive for arthritis, back pain, joint pain and neck pain.   Objective  Blood pressure (!) 145/75, pulse 63, temperature 97.9 F (36.6 C), temperature source Temporal, resp. rate 17, height 5' 6"  (1.676 m), weight 174 lb (78.9 kg), SpO2 100 %.  Vitals with BMI 03/27/2020 03/17/2020 03/01/2020  Height 5' 6"  5' 6"  -  Weight 174 lbs 174 lbs -  BMI 25.4 27.0 -  Systolic 623 762 831  Diastolic 75 79 71  Pulse 63 47 89     Physical Exam  Constitutional:  Appears older than stated age.   Cardiovascular: Regular rhythm and intact distal pulses. Bradycardia present. Exam reveals no gallop.  No murmur heard. No JVD. No pedal edema.  Pulmonary/Chest: Effort normal and breath sounds normal. No accessory muscle usage. No respiratory distress.  Abdominal: Soft. Bowel sounds are normal.   Laboratory examination:   Recent Labs    09/25/19 1041 09/25/19 1041 02/15/20 0606 02/29/20 1041 03/01/20 0223  NA 139   < > 141 138 138  K 4.1   < > 3.7 5.0 3.8  CL 102   < > 105 104 108  CO2 25  --  23  --  21*  GLUCOSE 164*   < > 102* 147* 120*  BUN 10   < > 13 8 7*  CREATININE 0.66   < > 0.70 0.50 0.55  CALCIUM 8.5*  --  8.8*  --  7.7*  GFRNONAA >60  --  >60  --  >60  GFRAA >60  --  >60  --  >60   < > = values in this interval not displayed.   CrCl cannot be calculated (Patient's most recent lab result is older than the maximum 21 days allowed.).  CMP Latest Ref Rng & Units 03/01/2020 02/29/2020 02/15/2020  Glucose 70 - 99 mg/dL 120(H) 147(H) 102(H)  BUN 8 - 23 mg/dL 7(L) 8 13  Creatinine 0.44 - 1.00 mg/dL 0.55 0.50 0.70  Sodium 135 - 145 mmol/L 138 138 141  Potassium 3.5 - 5.1 mmol/L 3.8 5.0 3.7  Chloride 98 - 111 mmol/L 108 104 105   CO2 22 - 32 mmol/L 21(L) - 23  Calcium 8.9 - 10.3 mg/dL 7.7(L) - 8.8(L)  Total Protein 6.5 - 8.1 g/dL - - -  Total Bilirubin 0.3 - 1.2 mg/dL - - -  Alkaline Phos 38 - 126 U/L - - -  AST 15 - 41 U/L - - -  ALT 0 - 44 U/L - - -   CBC Latest Ref Rng & Units 03/01/2020 02/29/2020 02/15/2020  WBC 4.0 - 10.5 K/uL 11.3(H) - 7.3  Hemoglobin 12.0 - 15.0 g/dL 11.2(L) 12.2 13.0  Hematocrit 36.0 - 46.0 % 32.8(L) 36.0 38.5  Platelets 150 - 400 K/uL 250 - 185   Lipid Panel  No results found for: CHOL, TRIG, HDL, CHOLHDL, VLDL, LDLCALC, LDLDIRECT HEMOGLOBIN A1C Lab Results  Component Value Date   HGBA1C 5.5 02/29/2020   MPG 111.15 02/29/2020   TSH No results for input(s): TSH in the last 8760 hours.  External labs:   01/24/2020: Glucose 87, BUN/Cr 19/0.87. EGFR >=60. Na/K 139/5.0. Rest of the CMP normal H/H 15.1/43.4. MCV 94.8. Platelets 227  Cholesterol, total 359.000 M 02/15/2019 Triglycerides 352.000 09/13/2019 HDL 53.000 M 02/15/2019 LDL 140.000 09/24/2017  Medications and allergies   Allergies  Allergen Reactions  . Adhesive [Tape] Itching, Rash and Other (See Comments)    Paper tape okay  . Latex Rash  . Oxycodone     Loopy, felt sick  . Remicade [Infliximab] Itching     Current Outpatient Medications  Medication Instructions  . acetaminophen (TYLENOL) 500 mg, Oral, Every 6 hours PRN  . ALPRAZolam (XANAX) 0.5 mg, Oral, 3 times daily PRN  . amLODipine (NORVASC) 5 mg, Oral, Daily  . aspirin EC 81 mg, Oral, Daily  . atorvastatin (LIPITOR) 40 mg, Oral, Daily-1800  . butalbital-acetaminophen-caffeine (FIORICET, ESGIC) 50-325-40 MG per tablet 1 tablet, Oral, 3 times daily PRN  . clopidogrel (PLAVIX) 75 mg, Oral, Daily  . dicyclomine (BENTYL) 20 mg, Oral, 2 times daily  . ergocalciferol (VITAMIN D2) 50,000 Units, Oral, Every Thu  . estradiol (ESTRACE) 2 mg, Oral, Daily  . gabapentin (NEURONTIN) 300 mg, Oral, 2 times daily PRN  . glimepiride (AMARYL) 4 mg, Oral, 2 times daily   . levothyroxine (SYNTHROID) 175 mcg, Oral, Daily before breakfast  . loperamide (IMODIUM A-D) 4 mg, Oral, 3 times daily PRN  . metoprolol tartrate (LOPRESSOR) 25 mg, Oral, 2 times daily  . Multiple Vitamin (MULTIVITAMIN) tablet 1 tablet, Oral, Daily  . nitroGLYCERIN (NITROSTAT) 0.4 mg, Sublingual, Every 5 min PRN  . pantoprazole (PROTONIX) 40 mg, Oral, Daily  . pioglitazone (ACTOS) 15 mg, Oral, Daily  . Potassium Chloride ER 20 MEQ TBCR 20 mEq, Oral, 2 times daily  . promethazine (PHENERGAN) 25 mg, Rectal, Every 8 hours PRN  . ranolazine (RANEXA) 500 mg, Oral, 2 times daily   Radiology:   CT Abdomen Pelvis W Contrast 02/20/2018: Age advanced atherosclerotic calcifications involving the aorta and iliac arteries.  CT Cardiac scoring 02/01/2020: 1. Calcium score of 868. This is between the 90th and 100 percentile for females between the ages of 29 and 72. This is consistent with extensive atherosclerotic plaque. There is high likelihood of at least one significant coronary artery narrowing.  Cardiac Studies:   Lexiscan Tetrofosmin stress test 02/09/2020: Stress EKG is non-diagnostic, as this is pharmacological stress test. Rest and stress EKG show sinus rhythm with ventricular bigeminy/trigeminy. Small size, mild intensity, reversible perfusion defect in basal anteroseptal myocardium. Stress LVEF 49%. Intermediate risk stress test.  Echocardiogram 02/10/2020: Left ventricle cavity is normal in size. Moderate concentric hypertrophy of the left ventricle. Normal global wall motion. Normal LV systolic function with visual EF 55-60%. Diastolic function not assessed due to ventricular bigeminy.  Left atrial cavity is mildly dilated. Mild to moderate mitral regurgitation. IVC is dilated with respiratory variation. Estimated RA pressure 8 mmHg.  Coronary  angiography and attempted intervention 02/15/2020: LM: Short, normal. LAD: Small to medium caliber vessel with prox-mid, eccentric 80% stenosis  with moderate calcification.  Occluded calcific Diag 1, likely originating at the site of LAD stenosis. Under filled apical LAD. Left-to-left (LCx-LAD/Diag) and right-to-left (RCA-LAD/Diag) collaterals LCx: Large vessel. Prox 30% stenosis. RCA: Ostial, eccentric 80% stenosis with moderate calcification. LVEDP normal.  Attempted LAD/RCA wiring. Intervention aborted due to poor guide support and difficulty wiring through radial access.   Attempted coronary intervention 02/29/2020: Partially successful PTCA to ostial RCA 60% residual, severe calcific stenosis in ostial RCA.   EKG  EKG 02/24/2020: Sinus rhythm 76 bpm. Early R wave transition. Nonspecific ST-T changes.  Assessment     ICD-10-CM   1. Coronary artery disease involving native coronary artery of native heart with other form of angina pectoris (HCC)  I25.118 amLODipine (NORVASC) 5 MG tablet  2. Ventricular bigeminy  I49.8   3. Mixed hyperlipidemia  E78.2 atorvastatin (LIPITOR) 40 MG tablet  4. Primary hypertension  I10      Meds ordered this encounter  Medications  . amLODipine (NORVASC) 5 MG tablet    Sig: Take 1 tablet (5 mg total) by mouth daily.    Dispense:  90 tablet    Refill:  1  . atorvastatin (LIPITOR) 40 MG tablet    Sig: Take 1 tablet (40 mg total) by mouth daily at 6 PM.    Dispense:  90 tablet    Refill:  2    Medications Discontinued During This Encounter  Medication Reason  . cholestyramine (QUESTRAN) 4 g packet Duplicate  . ondansetron (ZOFRAN) 4 MG tablet Change in therapy  . promethazine (PHENERGAN) 25 MG tablet Duplicate  . propranolol ER (INDERAL LA) 120 MG 24 hr capsule Change in therapy  . 0.9 %  sodium chloride infusion   . atorvastatin (LIPITOR) 40 MG tablet Reorder  . amLODipine (NORVASC) 2.5 MG tablet Reorder    Recommendations:   Jessica [name]  is a 69 y.o. Caucasian female w/CAD with angina equivalen, ventricular bigeminy and dyspnea on exertion,  former smoker, family h/o CAD with prox  LAD 59 to 70% and high grade ostial RCA calcific disease.  Attempted intervention through radial and femoral access was unsuccessful x 2 (02/15/2020 and 02/29/2020) due to difficult anatomy.  Her past medical history significant for hypertension, hyperlipidemia, diabetes mellitus, severe degenerative disc disease presents for f/u of CAD.  She still has limitation with regard to dyspnea on exertion which is related to underlying 40+ pack year history of smoking but anginal equivalency especially in view of high-grade ostial RCA stenosis cannot be excluded.  Would recommend attempted angioplasty to the right coronary artery.  I also discussed regarding medical therapy but patient states she would like to try one attempt at right coronary artery angioplasty, I reviewed the angiograms with the patient and her husband as well.  She is accompanied by her husband, states that she has not had any chest pain but has dyspnea on exertion that is chronic and also chronic back pain.  She has been extremely anxious of this visit as she does not want to have CABG. I reviewed the angiograms, although LAD stenosis may be significant, the whole artery is diffusely diseased and very small and barely reaches apex, circumflex being the most dominant of the 3 vessels.  We could certainly treat LAD disease medically, and I discussed with the patient and her husband regarding ARTS and Courage trial.  Her blood pressure is  elevated today, patient extremely anxious however will utilize this opportunity to add 5 mg of amlodipine increased from 2.5 mg for angina equivalent dyspnea.  She is planning on a trip to go to the beach this week, advised her the angioplasty is certainly not urgent.  I will try to set this up in the next 2 to 4 weeks.  Her lipids have not been well controlled, recently Lipitor has been increased from 20 mg to 40 mg, continue the same.  She has marked mixed hyperlipidemia, diet and weight loss discussed in detail.   Her physical activity is limited due to spinal disease.  She also has a 40+ pack year history of smoking cigarettes, she will need low-dose screening CT at some point.  Adrian Prows, MD, [facility] 03/27/2020, 6:54 PM [facility]. [place] Office: [phone]

## 2020-03-27 ENCOUNTER — Other Ambulatory Visit: Payer: Self-pay

## 2020-03-27 ENCOUNTER — Ambulatory Visit: Payer: Medicare Other | Admitting: Cardiology

## 2020-03-27 ENCOUNTER — Encounter: Payer: Self-pay | Admitting: Cardiology

## 2020-03-27 VITALS — BP 145/75 | HR 63 | Temp 97.9°F | Resp 17 | Ht 66.0 in | Wt 174.0 lb

## 2020-03-27 DIAGNOSIS — I498 Other specified cardiac arrhythmias: Secondary | ICD-10-CM | POA: Diagnosis not present

## 2020-03-27 DIAGNOSIS — I10 Essential (primary) hypertension: Secondary | ICD-10-CM

## 2020-03-27 DIAGNOSIS — I25118 Atherosclerotic heart disease of native coronary artery with other forms of angina pectoris: Secondary | ICD-10-CM | POA: Diagnosis not present

## 2020-03-27 DIAGNOSIS — E782 Mixed hyperlipidemia: Secondary | ICD-10-CM | POA: Diagnosis not present

## 2020-03-27 DIAGNOSIS — R931 Abnormal findings on diagnostic imaging of heart and coronary circulation: Secondary | ICD-10-CM | POA: Diagnosis not present

## 2020-03-27 MED ORDER — AMLODIPINE BESYLATE 5 MG PO TABS: 5.0000 mg | ORAL_TABLET | Freq: Every day | ORAL | 1 refills | Status: DC

## 2020-03-27 MED ORDER — ATORVASTATIN CALCIUM 40 MG PO TABS: 40.0000 mg | ORAL_TABLET | Freq: Every day | ORAL | 2 refills | Status: DC

## 2020-04-06 ENCOUNTER — Telehealth: Payer: Self-pay

## 2020-04-06 DIAGNOSIS — F411 Generalized anxiety disorder: Secondary | ICD-10-CM

## 2020-04-06 MED ORDER — DULOXETINE HCL 30 MG PO CPEP: 30.0000 mg | ORAL_CAPSULE | Freq: Every day | ORAL | 3 refills | Status: DC

## 2020-04-06 NOTE — Telephone Encounter (Signed)
ICD-10-CM   1. Generalized anxiety disorder  F41.1 DULoxetine (CYMBALTA) 30 MG capsule

## 2020-04-06 NOTE — Telephone Encounter (Signed)
Patient called and asked if you would call her something in for her anxiety.

## 2020-04-06 NOTE — Telephone Encounter (Signed)
Hi Baxter Flattery, talk to me about this tomorrow

## 2020-04-13 ENCOUNTER — Other Ambulatory Visit: Payer: Self-pay

## 2020-04-13 ENCOUNTER — Other Ambulatory Visit: Payer: Self-pay | Admitting: Cardiology

## 2020-04-13 DIAGNOSIS — I10 Essential (primary) hypertension: Secondary | ICD-10-CM

## 2020-04-13 DIAGNOSIS — I25118 Atherosclerotic heart disease of native coronary artery with other forms of angina pectoris: Secondary | ICD-10-CM

## 2020-04-14 ENCOUNTER — Other Ambulatory Visit (HOSPITAL_COMMUNITY)
Admission: RE | Admit: 2020-04-14 | Discharge: 2020-04-14 | Disposition: A | Discharge status: Home / Self Care | Payer: Medicare Other | Source: Ambulatory Visit | Attending: Cardiology | Admitting: Cardiology

## 2020-04-14 DIAGNOSIS — Z20822 Contact with and (suspected) exposure to covid-19: Secondary | ICD-10-CM | POA: Insufficient documentation

## 2020-04-14 DIAGNOSIS — Z01812 Encounter for preprocedural laboratory examination: Secondary | ICD-10-CM | POA: Insufficient documentation

## 2020-04-14 LAB — CBC WITH DIFFERENTIAL
Basophils Absolute: 0.1 10*3/uL (ref 0.0–0.2)
Basos: 1 %
EOS (ABSOLUTE): 0.1 10*3/uL (ref 0.0–0.4)
Eos: 2 %
Hematocrit: 40 % (ref 34.0–46.6)
Hemoglobin: 14 g/dL (ref 11.1–15.9)
Immature Grans (Abs): 0 10*3/uL (ref 0.0–0.1)
Immature Granulocytes: 0 %
Lymphocytes Absolute: 1.5 10*3/uL (ref 0.7–3.1)
Lymphs: 23 %
MCH: 32.7 pg (ref 26.6–33.0)
MCHC: 35 g/dL (ref 31.5–35.7)
MCV: 94 fL (ref 79–97)
Monocytes Absolute: 0.6 10*3/uL (ref 0.1–0.9)
Monocytes: 10 %
Neutrophils Absolute: 4.2 10*3/uL (ref 1.4–7.0)
Neutrophils: 64 %
RBC: 4.28 x10E6/uL (ref 3.77–5.28)
RDW: 11.9 % (ref 11.7–15.4)
WBC: 6.5 10*3/uL (ref 3.4–10.8)

## 2020-04-14 LAB — BMP8+EGFR
BUN/Creatinine Ratio: 14 (ref 12–28)
BUN: 7 mg/dL — ABNORMAL LOW (ref 8–27)
CO2: 23 mmol/L (ref 20–29)
Calcium: 9 mg/dL (ref 8.7–10.3)
Chloride: 103 mmol/L (ref 96–106)
Creatinine, Ser: 0.51 mg/dL — ABNORMAL LOW (ref 0.57–1.00)
GFR calc Af Amer: 116 mL/min/{1.73_m2} (ref >59)
GFR calc non Af Amer: 101 mL/min/{1.73_m2} (ref >59)
Glucose: 139 mg/dL — ABNORMAL HIGH (ref 65–99)
Potassium: 4.2 mmol/L (ref 3.5–5.2)
Sodium: 142 mmol/L (ref 134–144)

## 2020-04-14 LAB — SARS CORONAVIRUS 2 (TAT 6-24 HRS): SARS Coronavirus 2: NEGATIVE

## 2020-04-15 NOTE — Progress Notes (Signed)
Normal CBC and BMP

## 2020-04-18 ENCOUNTER — Encounter (HOSPITAL_COMMUNITY)
Admission: RE | Disposition: A | Discharge status: Home / Self Care | Payer: Self-pay | Source: Home / Self Care | Attending: Cardiology

## 2020-04-18 ENCOUNTER — Ambulatory Visit (HOSPITAL_COMMUNITY)
Admission: RE | Admit: 2020-04-18 | Discharge: 2020-04-18 | Disposition: A | Discharge status: Home / Self Care | Payer: Medicare Other | Attending: Cardiology | Admitting: Cardiology

## 2020-04-18 ENCOUNTER — Other Ambulatory Visit: Payer: Self-pay

## 2020-04-18 ENCOUNTER — Telehealth (HOSPITAL_COMMUNITY): Payer: Self-pay

## 2020-04-18 DIAGNOSIS — I25118 Atherosclerotic heart disease of native coronary artery with other forms of angina pectoris: Secondary | ICD-10-CM | POA: Diagnosis not present

## 2020-04-18 DIAGNOSIS — Z6828 Body mass index (BMI) 28.0-28.9, adult: Secondary | ICD-10-CM | POA: Insufficient documentation

## 2020-04-18 DIAGNOSIS — F419 Anxiety disorder, unspecified: Secondary | ICD-10-CM | POA: Diagnosis not present

## 2020-04-18 DIAGNOSIS — K219 Gastro-esophageal reflux disease without esophagitis: Secondary | ICD-10-CM | POA: Diagnosis not present

## 2020-04-18 DIAGNOSIS — M069 Rheumatoid arthritis, unspecified: Secondary | ICD-10-CM | POA: Diagnosis not present

## 2020-04-18 DIAGNOSIS — Z7984 Long term (current) use of oral hypoglycemic drugs: Secondary | ICD-10-CM | POA: Diagnosis not present

## 2020-04-18 DIAGNOSIS — E119 Type 2 diabetes mellitus without complications: Secondary | ICD-10-CM | POA: Diagnosis not present

## 2020-04-18 DIAGNOSIS — Z955 Presence of coronary angioplasty implant and graft: Secondary | ICD-10-CM | POA: Diagnosis not present

## 2020-04-18 DIAGNOSIS — Z7982 Long term (current) use of aspirin: Secondary | ICD-10-CM | POA: Diagnosis not present

## 2020-04-18 DIAGNOSIS — Z79890 Hormone replacement therapy (postmenopausal): Secondary | ICD-10-CM | POA: Diagnosis not present

## 2020-04-18 DIAGNOSIS — Z7902 Long term (current) use of antithrombotics/antiplatelets: Secondary | ICD-10-CM | POA: Insufficient documentation

## 2020-04-18 DIAGNOSIS — R9439 Abnormal result of other cardiovascular function study: Secondary | ICD-10-CM | POA: Diagnosis not present

## 2020-04-18 DIAGNOSIS — Z95820 Peripheral vascular angioplasty status with implants and grafts: Secondary | ICD-10-CM

## 2020-04-18 DIAGNOSIS — Z87891 Personal history of nicotine dependence: Secondary | ICD-10-CM | POA: Insufficient documentation

## 2020-04-18 DIAGNOSIS — F329 Major depressive disorder, single episode, unspecified: Secondary | ICD-10-CM | POA: Insufficient documentation

## 2020-04-18 DIAGNOSIS — I2510 Atherosclerotic heart disease of native coronary artery without angina pectoris: Secondary | ICD-10-CM | POA: Diagnosis present

## 2020-04-18 DIAGNOSIS — E039 Hypothyroidism, unspecified: Secondary | ICD-10-CM | POA: Insufficient documentation

## 2020-04-18 DIAGNOSIS — Z79899 Other long term (current) drug therapy: Secondary | ICD-10-CM | POA: Diagnosis not present

## 2020-04-18 DIAGNOSIS — J449 Chronic obstructive pulmonary disease, unspecified: Secondary | ICD-10-CM | POA: Insufficient documentation

## 2020-04-18 DIAGNOSIS — R0609 Other forms of dyspnea: Secondary | ICD-10-CM | POA: Insufficient documentation

## 2020-04-18 DIAGNOSIS — E669 Obesity, unspecified: Secondary | ICD-10-CM | POA: Diagnosis not present

## 2020-04-18 DIAGNOSIS — I498 Other specified cardiac arrhythmias: Secondary | ICD-10-CM | POA: Insufficient documentation

## 2020-04-18 DIAGNOSIS — Z951 Presence of aortocoronary bypass graft: Secondary | ICD-10-CM | POA: Diagnosis not present

## 2020-04-18 DIAGNOSIS — I10 Essential (primary) hypertension: Secondary | ICD-10-CM | POA: Diagnosis not present

## 2020-04-18 DIAGNOSIS — E782 Mixed hyperlipidemia: Secondary | ICD-10-CM | POA: Diagnosis not present

## 2020-04-18 DIAGNOSIS — E559 Vitamin D deficiency, unspecified: Secondary | ICD-10-CM | POA: Diagnosis not present

## 2020-04-18 HISTORY — PX: CORONARY ATHERECTOMY: CATH118238

## 2020-04-18 HISTORY — PX: CORONARY STENT INTERVENTION: CATH118234

## 2020-04-18 LAB — POCT ACTIVATED CLOTTING TIME: Activated Clotting Time: 279 seconds

## 2020-04-18 LAB — GLUCOSE, CAPILLARY
Glucose-Capillary: 121 mg/dL — ABNORMAL HIGH (ref 70–99)
Glucose-Capillary: 86 mg/dL (ref 70–99)

## 2020-04-18 SURGERY — CORONARY STENT INTERVENTION
Anesthesia: LOCAL

## 2020-04-18 MED ORDER — LIDOCAINE HCL (PF) 1 % IJ SOLN
INTRAMUSCULAR | Status: AC
  Filled 2020-04-18: qty 30

## 2020-04-18 MED ORDER — NITROGLYCERIN 1 MG/10 ML FOR IR/CATH LAB
INTRA_ARTERIAL | Status: DC | PRN
  Administered 2020-04-18 (×2): 200 ug via INTRACORONARY

## 2020-04-18 MED ORDER — ASPIRIN 81 MG PO CHEW: 81.0000 mg | CHEWABLE_TABLET | ORAL | Status: DC

## 2020-04-18 MED ORDER — SODIUM CHLORIDE 0.9% FLUSH: 3.0000 mL | INTRAVENOUS | Status: DC | PRN

## 2020-04-18 MED ORDER — HEPARIN SODIUM (PORCINE) 1000 UNIT/ML IJ SOLN
INTRAMUSCULAR | Status: DC | PRN
  Administered 2020-04-18: 8000 [IU] via INTRAVENOUS
  Administered 2020-04-18: 1000 [IU] via INTRAVENOUS

## 2020-04-18 MED ORDER — CLOPIDOGREL BISULFATE 75 MG PO TABS
75.0000 mg | ORAL_TABLET | Freq: Every day | ORAL | Status: DC
  Administered 2020-04-18: 75 mg via ORAL
  Filled 2020-04-18: qty 1

## 2020-04-18 MED ORDER — SODIUM CHLORIDE 0.9 % IV SOLN: 250.0000 mL | INTRAVENOUS | Status: DC | PRN

## 2020-04-18 MED ORDER — SODIUM CHLORIDE 0.9 % WEIGHT BASED INFUSION: 1.0000 mL/kg/h | INTRAVENOUS | Status: DC

## 2020-04-18 MED ORDER — SODIUM CHLORIDE 0.9% FLUSH: 3.0000 mL | Freq: Two times a day (BID) | INTRAVENOUS | Status: DC

## 2020-04-18 MED ORDER — VIPERSLIDE LUBRICANT OPTIME
TOPICAL | Status: DC | PRN
  Administered 2020-04-18: 150 mL via SURGICAL_CAVITY

## 2020-04-18 MED ORDER — ONDANSETRON HCL 4 MG/2ML IJ SOLN: 4.0000 mg | Freq: Four times a day (QID) | INTRAMUSCULAR | Status: DC | PRN

## 2020-04-18 MED ORDER — SODIUM CHLORIDE 0.9 % WEIGHT BASED INFUSION
3.0000 mL/kg/h | INTRAVENOUS | Status: AC
  Administered 2020-04-18: 3 mL/kg/h via INTRAVENOUS

## 2020-04-18 MED ORDER — NITROGLYCERIN 1 MG/10 ML FOR IR/CATH LAB
INTRA_ARTERIAL | Status: AC
  Filled 2020-04-18: qty 10

## 2020-04-18 MED ORDER — HEPARIN SODIUM (PORCINE) 1000 UNIT/ML IJ SOLN
INTRAMUSCULAR | Status: AC
  Filled 2020-04-18: qty 1

## 2020-04-18 MED ORDER — FENTANYL CITRATE (PF) 100 MCG/2ML IJ SOLN
INTRAMUSCULAR | Status: DC | PRN
  Administered 2020-04-18 (×2): 50 ug via INTRAVENOUS

## 2020-04-18 MED ORDER — MIDAZOLAM HCL 2 MG/2ML IJ SOLN
INTRAMUSCULAR | Status: DC | PRN
  Administered 2020-04-18: 2 mg via INTRAVENOUS

## 2020-04-18 MED ORDER — HEPARIN (PORCINE) IN NACL 1000-0.9 UT/500ML-% IV SOLN
INTRAVENOUS | Status: DC | PRN
  Administered 2020-04-18: 500 mL

## 2020-04-18 MED ORDER — HEPARIN (PORCINE) IN NACL 1000-0.9 UT/500ML-% IV SOLN
INTRAVENOUS | Status: AC
  Filled 2020-04-18: qty 1000

## 2020-04-18 MED ORDER — LIDOCAINE HCL (PF) 1 % IJ SOLN
INTRAMUSCULAR | Status: DC | PRN
  Administered 2020-04-18: 12 mL

## 2020-04-18 MED ORDER — ACETAMINOPHEN 325 MG PO TABS
650.0000 mg | ORAL_TABLET | ORAL | Status: DC | PRN
  Administered 2020-04-18: 650 mg via ORAL
  Filled 2020-04-18: qty 2

## 2020-04-18 MED ORDER — SODIUM CHLORIDE 0.9 % WEIGHT BASED INFUSION
1.0000 mL/kg/h | INTRAVENOUS | Status: DC
  Administered 2020-04-18: 12.662 mL/kg/h via INTRAVENOUS

## 2020-04-18 MED ORDER — FENTANYL CITRATE (PF) 100 MCG/2ML IJ SOLN
INTRAMUSCULAR | Status: AC
  Filled 2020-04-18: qty 2

## 2020-04-18 MED ORDER — MIDAZOLAM HCL 2 MG/2ML IJ SOLN
INTRAMUSCULAR | Status: AC
  Filled 2020-04-18: qty 2

## 2020-04-18 MED ORDER — IOHEXOL 350 MG/ML SOLN
INTRAVENOUS | Status: DC | PRN
  Administered 2020-04-18: 90 mL

## 2020-04-18 SURGICAL SUPPLY — 21 items
BALLN SAPPHIRE 2.5X10 (BALLOONS) ×2
BALLOON SAPPHIRE 2.5X10 (BALLOONS) IMPLANT
CATH LAUNCHER 6FR HS SH (CATHETERS) ×1 IMPLANT
CLOSURE MYNX CONTROL 6F/7F (Vascular Products) ×1 IMPLANT
CROWN DIAMONDBACK CLASSIC 1.25 (BURR) ×1 IMPLANT
ELECT DEFIB PAD ADLT CADENCE (PAD) ×1 IMPLANT
GUIDEWIRE INQWIRE 1.5J.035X260 (WIRE) IMPLANT
INQWIRE 1.5J .035X260CM (WIRE) ×2
KIT ENCORE 26 ADVANTAGE (KITS) ×1 IMPLANT
KIT HEART LEFT (KITS) ×2 IMPLANT
KIT MICROPUNCTURE NIT STIFF (SHEATH) ×1 IMPLANT
LUBRICANT VIPERSLIDE CORONARY (MISCELLANEOUS) ×1 IMPLANT
PACK CARDIAC CATHETERIZATION (CUSTOM PROCEDURE TRAY) ×2 IMPLANT
SHEATH PINNACLE 6F 10CM (SHEATH) ×1 IMPLANT
SHEATH PROBE COVER 6X72 (BAG) ×1 IMPLANT
STENT RESOLUTE ONYX 3.5X22 (Permanent Stent) ×1 IMPLANT
TRANSDUCER W/STOPCOCK (MISCELLANEOUS) ×2 IMPLANT
TUBING CIL FLEX 10 FLL-RA (TUBING) ×2 IMPLANT
WIRE COUGAR XT STRL 190CM (WIRE) ×1 IMPLANT
WIRE EMERALD 3MM-J .035X150CM (WIRE) ×1 IMPLANT
WIRE VIPERWIRE COR FLEX .012 (WIRE) ×1 IMPLANT

## 2020-04-18 NOTE — Telephone Encounter (Signed)
Pt insurance is active and benefits verified through Medicare A/B. Co-pay $0.00, DED $203.00/$203.00 met, out of pocket $0.00/$0.00 met, co-insurance 20%. No pre-authorization required. Passport, 04/18/20 @ 4:26PM, BEE#[id number]  2ndary insurance is active and benefits verified through [employer]. Co-pay $0.00, DED $0.00/$0.00 met, out of pocket $0.00/$0.00 met, co-insurance 0%. No pre-authorization required. Passport, 04/18/20 @ 428PM, DIY#[id number]  Will contact patient to see if she is interested in the Cardiac Rehab Program. If interested, patient will need to complete follow up appt. Once completed, patient will be contacted for scheduling upon review by the RN Navigator.

## 2020-04-18 NOTE — Progress Notes (Signed)
[phone] Pt seen by me last admission. Pt voiced understanding of ed. Reviewed importance of plavix with stent. Reviewed NTG use, low carb and heart healthy food choices, walking for ex, and CRP 2. Referred to [place] CRP 2.  Pt stated she quit smoking over three years ago. Graylon Good RN BSN 04/18/2020 1:47 PM

## 2020-04-18 NOTE — Progress Notes (Signed)
Spoke with Dr Einar Gip, does not need to see pt before D/C. States pt is okay to D/C.

## 2020-04-18 NOTE — Discharge Instructions (Signed)
Femoral Site Care This sheet gives you information about how to care for yourself after your procedure. Your health care provider may also give you more specific instructions. If you have problems or questions, contact your health care provider. What can I expect after the procedure?  After the procedure, it is common to have:  Bruising that usually fades within 1-2 weeks.  Tenderness at the site. Follow these instructions at home: Wound care 1. Follow instructions from your health care provider about how to take care of your insertion site. Make sure you: ? Wash your hands with soap and water before you change your bandage (dressing). If soap and water are not available, use hand sanitizer. ? Remove your dressing as told by your health care provider. In 24-48 hours 2. Do not take baths, swim, or use a hot tub until your health care provider approves. 3. You may shower 24-48 hours after the procedure or as told by your health care provider. ? Gently wash the site with plain soap and water. ? Pat the area dry with a clean towel. ? Do not rub the site. This may cause bleeding. 4. Do not apply powder or lotion to the site. Keep the site clean and dry. 5. Check your femoral site every day for signs of infection. Check for: ? Redness, swelling, or pain. ? Fluid or blood. ? Warmth. ? Pus or a bad smell. Activity 1. For the first 2-3 days after your procedure, or as long as directed: ? Avoid climbing stairs as much as possible. ? Do not squat. 2. Do not lift anything that is heavier than 5 lb, or the limit that you are told, until your health care provider says that it is safe. For 7 days 3. Rest as directed. ? Avoid sitting for a long time without moving. Get up to take short walks every 1-2 hours. 4. Do not drive for 24 hours if you were given a medicine to help you relax (sedative). General instructions  Take over-the-counter and prescription medicines only as told by your health care  provider.  Keep all follow-up visits as told by your health care provider. This is important. Contact a health care provider if you have:  A fever or chills.  You have redness, swelling, or pain around your insertion site. Get help right away if:  The catheter insertion area swells very fast.  You pass out.  You suddenly start to sweat or your skin gets clammy.  The catheter insertion area is bleeding, and the bleeding does not stop when you hold steady pressure on the area.  The area near or just beyond the catheter insertion site becomes pale, cool, tingly, or numb. These symptoms may represent a serious problem that is an emergency. Do not wait to see if the symptoms will go away. Get medical help right away. Call your local emergency services (911 in the [place] Do not drive yourself to the hospital. Summary  After the procedure, it is common to have bruising that usually fades within 1-2 weeks.  Check your femoral site every day for signs of infection.  Do not lift anything that is heavier than 10 lb (4.5 kg), or the limit that you are told, until your health care provider says that it is safe. This information is not intended to replace advice given to you by your health care provider. Make sure you discuss any questions you have with your health care provider. Document Revised: 12/01/2017 Document Reviewed: 12/01/2017 Elsevier Patient  Education  [employer].

## 2020-04-18 NOTE — Interval H&P Note (Signed)
History and Physical Interval Note:  04/18/2020 11:23 AM  Jessica [name]  has presented today for surgery, with the diagnosis of cad.  The various methods of treatment have been discussed with the patient and family. After consideration of risks, benefits and other options for treatment, the patient has consented to  Procedure(s): LEFT HEART CATH AND CORONARY ANGIOGRAPHY (N/A) PTCA to RCA as a surgical intervention.  The patient's history has been reviewed, patient examined, no change in status, stable for surgery.  I have reviewed the patient's chart and labs.  Questions were answered to the patient's satisfaction.   Cath Lab Visit (complete for each Cath Lab visit)  Clinical Evaluation Leading to the Procedure:   ACS: No.  Non-ACS:    Anginal Classification: CCS III  Anti-ischemic medical therapy: Maximal Therapy (2 or more classes of medications)  Non-Invasive Test Results: Intermediate-risk stress test findings: cardiac mortality 1-3%/year  Prior CABG: No previous CABG  Adrian Prows

## 2020-04-19 NOTE — Telephone Encounter (Signed)
Called patient to see if she is interested in the Cardiac Rehab Program. Patient expressed interest. Explained scheduling process and went over insurance, patient verbalized understanding. Will contact patient for scheduling once f/u has been completed. 

## 2020-04-24 NOTE — Progress Notes (Signed)
 Primary Physician/Referring:  [name], [name], MD  Patient ID: Jessica [name], female    DOB: 07/20/1951, 68 y.o.   MRN: [id number]  Chief Complaint  Patient presents with  . Coronary Artery Disease  . Follow-up    Post cath/stent   HPI:    Jessica [name]  is a 68 y.o. Caucasian female patient with hypertension, hyperlipidemia, diabetes mellitus with peripheral neuropathy, chronic stable angina pectoris and dyspnea on exertion and coronary artery disease. Her physical activity is limited due to spinal disease.  She also has a 40+ pack year history of smoking cigarettes.    She has high coronary calcium score and cardiac catheterization revealing high-grade ostial RCA stenosis and mid LAD moderate stenosis, after review of previously performed attempted angioplasty, I had set her up for cardiac catheterization on 04/18/2020 with successful revascularization of the ostial RCA.    She now presents for follow-up, has noticed marked improvement in dyspnea and has not had any recurrence of angina pectoris.  She has resumed most of her activities.  The only complaint she had is arthritis in her knee and also bilateral ankle pain and foot pain from diabetic neuropathy and is wondering if she could take Neurontin 3 times a day.     Past Medical History:  Diagnosis Date  . Allergy   . Anxiety   . Asthma   . Cervical stenosis of spine   . COPD (chronic obstructive pulmonary disease) (HCC)   . DDD (degenerative disc disease)   . Depression   . Diabetes mellitus without complication (HCC)   . Fatty liver   . Gastric ulcer   . GERD (gastroesophageal reflux disease)   . Heart murmur   . Hyperglycemia   . Hyperlipidemia   . Hypothyroidism   . Irregular heartbeat   . Migraines   . Multiple gastric ulcers   . Obesity   . Osteoarthritis   . Rheumatoid arthritis (HCC)   . Sciatica   . Scoliosis   . Vitamin D deficiency    Past Surgical History:  Procedure Laterality Date  . ANTERIOR CERVICAL  DECOMP/DISCECTOMY FUSION  12/08/2017   Procedure: Anterior Cervical Decompression/Discectomy Fusion - Cervical four-Cervical five - Cervical five-Cervical six - Cervical six-Cervical seven;  Surgeon: Pool, Henry, MD;  Location: [facility] OR;  Service: Neurosurgery;;  . APPENDECTOMY    . CHOLECYSTECTOMY  2010  . COLONOSCOPY    . COLOSTOMY     removed 3 inches of colon 1987  . 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 CORONARY ATHERECTOMY  02/29/2020  . CORONARY ATHERECTOMY N/A 04/18/2020   Procedure: CORONARY ATHERECTOMY;  Surgeon: Ganji, Jay, MD;  Location: [facility];  Service: Cardiovascular;  Laterality: N/A;  Ostial RCA  . CORONARY BALLOON ANGIOPLASTY N/A 02/29/2020   Procedure: CORONARY BALLOON ANGIOPLASTY;  Surgeon: Patwardhan, Manish J, MD;  Location: [facility];  Service: Cardiovascular;  Laterality: N/A;  . CORONARY STENT INTERVENTION N/A 04/18/2020   Procedure: CORONARY STENT INTERVENTION;  Surgeon: Ganji, Jay, MD;  Location: [facility];  Service: Cardiovascular;  Laterality: N/A;  Ostial RCA  . HAND SURGERY Right 2006  . INTRAVASCULAR ULTRASOUND/IVUS N/A 02/29/2020   Procedure: Intravascular Ultrasound/IVUS;  Surgeon: Patwardhan, Manish J, MD;  Location: [facility];  Service: Cardiovascular;  Laterality: N/A;  . LEFT HEART CATH N/A 02/29/2020   Procedure: Left Heart Cath;  Surgeon: Patwardhan, Manish J, MD;  Location: [facility];  Service: Cardiovascular;    Laterality: N/A;  . LEFT HEART CATH AND CORONARY ANGIOGRAPHY N/A 02/15/2020   Procedure: LEFT HEART CATH AND CORONARY ANGIOGRAPHY;  Surgeon: Nigel Mormon, MD;  Location: [place] [facility];  Service: Cardiovascular;  Laterality: N/A;  . Perforated Colon  1986   by Dr. Margot Chimes  . POLYPECTOMY    . TOTAL ABDOMINAL HYSTERECTOMY  1989   Dr. Nori Riis  . UPPER GASTROINTESTINAL ENDOSCOPY    . UPPER GI ENDOSCOPY     Family History  Problem Relation Age of Onset  . Heart attack Father   . Heart  disease Mother   . Hypertension Mother   . Breast cancer Sister   . Diabetes Brother   . Stroke Brother   . Colon cancer Neg Hx   . Esophageal cancer Neg Hx   . Liver cancer Neg Hx   . Pancreatic cancer Neg Hx   . Rectal cancer Neg Hx   . Stomach cancer Neg Hx     Social History   Tobacco Use  . Smoking status: Former Smoker    Packs/day: 1.00    Years: 40.00    Pack years: 40.00    Types: Cigarettes    Quit date: 12/08/2017    Years since quitting: 2.3  . Smokeless tobacco: Never Used  Substance Use Topics  . Alcohol use: No   Marital Status: Married  ROS  Review of Systems  Constitution: Positive for malaise/fatigue.  Cardiovascular: Negative for chest pain, dyspnea on exertion and leg swelling.  Musculoskeletal: Positive for arthritis, back pain, joint pain and neck pain.  Gastrointestinal: Negative for melena.   Objective  Blood pressure (!) 130/49, pulse (!) 41, height 5' 6" (1.676 m), weight 169 lb (76.7 kg), SpO2 94 %.  Vitals with BMI 04/26/2020 04/18/2020 04/18/2020  Height 5' 6" - -  Weight 169 lbs - -  BMI 29.56 - -  Systolic 213 086 578  Diastolic 49 50 67  Pulse 41 64 77     Physical Exam  Constitutional:  Appears older than stated age.   Cardiovascular: Normal rate, regular rhythm and intact distal pulses.  Occasional extrasystoles are present. Exam reveals no gallop.  No murmur heard. No JVD. No pedal edema.  Pulmonary/Chest: Effort normal and breath sounds normal. No accessory muscle usage. No respiratory distress.  Abdominal: Soft. Bowel sounds are normal.   Laboratory examination:   Recent Labs    02/15/20 0606 02/15/20 0606 02/29/20 1041 03/01/20 0223 04/13/20 1305  NA 141   < > 138 138 142  K 3.7   < > 5.0 3.8 4.2  CL 105   < > 104 108 103  CO2 23  --   --  21* 23  GLUCOSE 102*   < > 147* 120* 139*  BUN 13   < > 8 7* 7*  CREATININE 0.70   < > 0.50 0.55 0.51*  CALCIUM 8.8*  --   --  7.7* 9.0  GFRNONAA >60  --   --  >60 101  GFRAA  >60  --   --  >60 116   < > = values in this interval not displayed.   estimated creatinine clearance is 72.4 mL/min (A) (by C-G formula based on SCr of 0.51 mg/dL (L)).  CMP Latest Ref Rng & Units 04/13/2020 03/01/2020 02/29/2020  Glucose 65 - 99 mg/dL 139(H) 120(H) 147(H)  BUN 8 - 27 mg/dL 7(L) 7(L) 8  Creatinine 0.57 - 1.00 mg/dL 0.51(L) 0.55 0.50  Sodium 134 -  144 mmol/L 142 138 138  Potassium 3.5 - 5.2 mmol/L 4.2 3.8 5.0  Chloride 96 - 106 mmol/L 103 108 104  CO2 20 - 29 mmol/L 23 21(L) -  Calcium 8.7 - 10.3 mg/dL 9.0 7.7(L) -  Total Protein 6.5 - 8.1 g/dL - - -  Total Bilirubin 0.3 - 1.2 mg/dL - - -  Alkaline Phos 38 - 126 U/L - - -  AST 15 - 41 U/L - - -  ALT 0 - 44 U/L - - -   CBC Latest Ref Rng & Units 04/13/2020 03/01/2020 02/29/2020  WBC 3.4 - 10.8 x10E3/uL 6.5 11.3(H) -  Hemoglobin 11.1 - 15.9 g/dL 14.0 11.2(L) 12.2  Hematocrit 34.0 - 46.6 % 40.0 32.8(L) 36.0  Platelets 150 - 400 K/uL - 250 -   Lipid Panel  No results found for: CHOL, TRIG, HDL, CHOLHDL, VLDL, LDLCALC, LDLDIRECT HEMOGLOBIN A1C Lab Results  Component Value Date   HGBA1C 5.5 02/29/2020   MPG 111.15 02/29/2020   TSH No results for input(s): TSH in the last 8760 hours.  External labs:   01/24/2020: Glucose 87, BUN/Cr 19/0.87. EGFR >=60. Na/K 139/5.0. Rest of the CMP normal H/H 15.1/43.4. MCV 94.8. Platelets 227  Cholesterol, total 359.000 M 02/15/2019 Triglycerides 352.000 09/13/2019 HDL 53.000 M 02/15/2019 LDL 140.000 09/24/2017  Medications and allergies   Allergies  Allergen Reactions  . Adhesive [Tape] Itching, Rash and Other (See Comments)    Paper tape okay  . Latex Rash  . Oxycodone     Loopy, felt sick  . Remicade [Infliximab] Itching     Current Outpatient Medications  Medication Instructions  . acetaminophen (TYLENOL) 500 mg, Oral, Every 6 hours PRN  . ALPRAZolam (XANAX) 0.5 mg, Oral, 3 times daily  . amLODipine (NORVASC) 5 mg, Oral, Daily  . aspirin EC 81 mg, Oral, Daily  .  atorvastatin (LIPITOR) 40 mg, Oral, Daily-1800  . butalbital-acetaminophen-caffeine (FIORICET, ESGIC) 50-325-40 MG per tablet 1 tablet, Oral, 3 times daily PRN  . cholestyramine (QUESTRAN) 4 g, Oral, 2 times daily  . clopidogrel (PLAVIX) 75 mg, Oral, Daily  . dicyclomine (BENTYL) 20 mg, Oral, 2 times daily  . DULoxetine (CYMBALTA) 30 mg, Oral, Daily  . ergocalciferol (VITAMIN D2) 50,000 Units, Oral, Every Thu  . estradiol (ESTRACE) 2 mg, Oral, Daily  . gabapentin (NEURONTIN) 300 mg, Oral, 2 times daily  . glimepiride (AMARYL) 4 mg, Oral, 2 times daily  . levothyroxine (SYNTHROID) 175 mcg, Oral, Daily before breakfast  . loperamide (IMODIUM A-D) 4 mg, Oral, 3 times daily PRN  . metoprolol tartrate (LOPRESSOR) 25 mg, Oral, 2 times daily  . Multiple Vitamin (MULTIVITAMIN) tablet 1 tablet, Oral, Daily  . nitroGLYCERIN (NITROSTAT) 0.4 mg, Sublingual, Every 5 min PRN  . pantoprazole (PROTONIX) 40 mg, Oral, Daily  . pioglitazone (ACTOS) 15 mg, Oral, Daily  . Potassium Chloride ER 20 MEQ TBCR 20 mEq, Oral, 2 times daily  . promethazine (PHENERGAN) 25 mg, Rectal, Every 8 hours PRN  . promethazine (PHENERGAN) 25 mg, Oral, Every 6 hours PRN  . ranolazine (RANEXA) 500 mg, Oral, 2 times daily   Radiology:   No results found.  Cardiac Studies:   CT Cardiac scoring 02/01/2020: 1. Calcium score of 868. This is between the 90th and 100 percentile for females between the ages of 65 and 69. This is consistent with extensive atherosclerotic plaque. There is high likelihood of at least one significant coronary artery narrowing.  Lexiscan Tetrofosmin stress test 02/09/2020: Stress EKG is non-diagnostic, as   this is pharmacological stress test. Rest and stress EKG show sinus rhythm with ventricular bigeminy/trigeminy. Small size, mild intensity, reversible perfusion defect in basal anteroseptal myocardium. Stress LVEF 49%. Intermediate risk stress test..  I had reviewed the angiograms after prior failed  attempt and set her up for angiography  Echocardiogram 02/10/2020: Left ventricle cavity is normal in size. Moderate concentric hypertrophy of the left ventricle. Normal global wall motion. Normal LV systolic function with visual EF 55-60%. Diastolic function not assessed due to ventricular bigeminy.  Left atrial cavity is mildly dilated. Mild to moderate mitral regurgitation. IVC is dilated with respiratory variation. Estimated RA pressure 8 mmHg.  Coronary angiogram 02/15/2020: LM: Short, normal. LAD: Small to medium caliber vessel with prox-mid, eccentric 60-70% stenosis with moderate calcification.  Does not reach the apex.          Occluded calcific Diag 1, likely originating at the site of LAD stenosis.         Under filled apical LAD         Left-to-left (LCx-LAD/Diag) and right-to-left (RCA-LAD/Diag) collaterals LCx: Large vessel. Prox 30% stenosis. RCA: Ostial, eccentric 80% stenosis with moderate calcification.   LVEDP normal.  Coronary angioplasty 04/18/2020  Ost RCA to Prox RCA lesion is 80% stenosed.  SP CSI orbital atherectomy with Diamond[place] followed by  STENT RESOLUTE ONYX 3.5X22.  Stenosis reduced to 0%.  Rec: Continue aspirin indefinitely and Plavix for 6 months, can be discharged home today with outpatient follow-up.   EKG  EKG 04/26/2020: Normal sinus rhythm with rate of seventy-seven beats minute, normal axis, frequent PVCs in the pattern of ventricular bigeminy.  No evidence of ischemia.  Compared to 02/24/2020, frequent PVCs new.  But these were present previously.    Assessment     ICD-10-CM   1. Status post angioplasty with stent  Z95.820 EKG 12-Lead    AMB referral to cardiac rehabilitation  2. Coronary artery disease involving native coronary artery of native heart with other form of angina pectoris (HCC)  I25.118 AMB referral to cardiac rehabilitation  3. Primary hypertension  I10 TSH  4. PVC (premature ventricular contraction)  I49.3   5. Dyspnea on  exertion  R06.00 AMB referral to cardiac rehabilitation  6. Mixed hyperlipidemia  E78.2 Lipoprotein A (LPA)    Lipid Panel With LDL/HDL Ratio     No orders of the defined types were placed in this encounter.   There are no discontinued medications.  Recommendations:   Jessica [name]  is a 68 y.o. Caucasian female patient with hypertension, hyperlipidemia, diabetes mellitus with peripheral neuropathy, chronic stable angina pectoris and dyspnea on exertion and coronary artery disease. Her physical activity is limited due to spinal disease.  She also has a 40+ pack year history of smoking cigarettes.  She underwent successful angioplasty and stenting to the ostial RCA on 04/18/2020 with implantation of a 3.5 x 22 mm resolute.  Has diffuse disease of 60% in the mid LAD which can be treated medically and it is small.  She is also on aggressive lipid-lowering therapy now.  Her lipids have not been well controlled, recently Lipitor has been increased from 20 mg to 40 mg, continue the same.  She will need lipid profile testing prior to her next visit in 2-3 months. She has marked mixed hyperlipidemia, diet and weight loss discussed in detail.  I will refer her for cardiac rehab.  Her blood pressure is not well controlled and she is tolerating 5 mg of amlodipine.  Her   physical activity is limited due to spinal disease.  With regard to peripheral neuropathy she would like to use Neurontin 3 times a day, advised her to discuss with Dr. Jani Gravel.  She also has a 40+ pack year history of smoking cigarettes, she will need low-dose screening CT at some point.  This was a 40-minute office visit encounter with multiple questions, regarding diet, medications, discussions regarding cardiac rehab.  Adrian Prows, MD, [facility] 04/30/2020, 9:29 AM [facility]. [place] Office: [phone]

## 2020-04-26 ENCOUNTER — Encounter: Payer: Self-pay | Admitting: Cardiology

## 2020-04-26 ENCOUNTER — Other Ambulatory Visit: Payer: Self-pay

## 2020-04-26 ENCOUNTER — Ambulatory Visit: Payer: Medicare Other | Admitting: Cardiology

## 2020-04-26 VITALS — BP 130/49 | HR 41 | Ht 66.0 in | Wt 169.0 lb

## 2020-04-26 DIAGNOSIS — Z95820 Peripheral vascular angioplasty status with implants and grafts: Secondary | ICD-10-CM

## 2020-04-26 DIAGNOSIS — I493 Ventricular premature depolarization: Secondary | ICD-10-CM

## 2020-04-26 DIAGNOSIS — I25118 Atherosclerotic heart disease of native coronary artery with other forms of angina pectoris: Secondary | ICD-10-CM | POA: Diagnosis not present

## 2020-04-26 DIAGNOSIS — I10 Essential (primary) hypertension: Secondary | ICD-10-CM | POA: Diagnosis not present

## 2020-04-26 DIAGNOSIS — R931 Abnormal findings on diagnostic imaging of heart and coronary circulation: Secondary | ICD-10-CM | POA: Diagnosis not present

## 2020-04-26 DIAGNOSIS — E782 Mixed hyperlipidemia: Secondary | ICD-10-CM | POA: Diagnosis not present

## 2020-04-26 DIAGNOSIS — R0609 Other forms of dyspnea: Secondary | ICD-10-CM

## 2020-04-26 DIAGNOSIS — R0600 Dyspnea, unspecified: Secondary | ICD-10-CM

## 2020-05-02 ENCOUNTER — Telehealth (HOSPITAL_COMMUNITY): Payer: Self-pay | Admitting: *Deleted

## 2020-05-02 NOTE — Telephone Encounter (Signed)
-----   Message from Adrian Prows, MD sent at 04/30/2020  3:24 PM EDT ----- Regarding: RE: ?? Medically stable for cardiac rehab She has been referred back to your. She had successful PCI ----- Message ----- From: Rowe Pavy, RN Sent: 04/27/2020  10:45 AM EDT To: Adrian Prows, MD Subject: ?? Medically stable for cardiac rehab           Dr. Einar Gip,  The above pt who is s/p DES to RCA on 5/18 was seen in follow up on 5/26 by the medical student.  In reviewing the progress note it is difficult to determine whether the pt is appropriate for scheduling cardiac rehab.  Pt with continued complaints of angina and the inability to progress her physical activity. Looks like Ranexa will be started and to follow up with you per note - 4/26.  However the next appt in epic with you is not until august.  Unsure on how best to proceed.  Cherre Huger, BSN Cardiac and Training and development officer

## 2020-05-03 ENCOUNTER — Telehealth (HOSPITAL_COMMUNITY): Payer: Self-pay

## 2020-05-09 DIAGNOSIS — R7612 Nonspecific reaction to cell mediated immunity measurement of gamma interferon antigen response without active tuberculosis: Secondary | ICD-10-CM | POA: Diagnosis not present

## 2020-05-09 DIAGNOSIS — M542 Cervicalgia: Secondary | ICD-10-CM | POA: Diagnosis not present

## 2020-05-09 DIAGNOSIS — Z6827 Body mass index (BMI) 27.0-27.9, adult: Secondary | ICD-10-CM | POA: Diagnosis not present

## 2020-05-09 DIAGNOSIS — E663 Overweight: Secondary | ICD-10-CM | POA: Diagnosis not present

## 2020-05-09 DIAGNOSIS — M545 Low back pain: Secondary | ICD-10-CM | POA: Diagnosis not present

## 2020-05-09 DIAGNOSIS — G629 Polyneuropathy, unspecified: Secondary | ICD-10-CM | POA: Diagnosis not present

## 2020-05-09 DIAGNOSIS — E559 Vitamin D deficiency, unspecified: Secondary | ICD-10-CM | POA: Diagnosis not present

## 2020-05-09 DIAGNOSIS — M25562 Pain in left knee: Secondary | ICD-10-CM | POA: Diagnosis not present

## 2020-05-09 DIAGNOSIS — M0609 Rheumatoid arthritis without rheumatoid factor, multiple sites: Secondary | ICD-10-CM | POA: Diagnosis not present

## 2020-05-09 DIAGNOSIS — M7989 Other specified soft tissue disorders: Secondary | ICD-10-CM | POA: Diagnosis not present

## 2020-05-30 ENCOUNTER — Telehealth (HOSPITAL_COMMUNITY): Payer: Self-pay | Admitting: Student-PharmD

## 2020-05-30 NOTE — Telephone Encounter (Signed)
Pt called to cancel her CR orientation, stated she was sick and would be going to the dr later today. Also states she will call back later to reschedule. Placed pt ppw in black bin on wall (ready to schedule).   Will follow up with the pt in a week, if she does not return call.

## 2020-06-01 ENCOUNTER — Ambulatory Visit (HOSPITAL_COMMUNITY): Payer: Medicare Other

## 2020-06-06 ENCOUNTER — Other Ambulatory Visit: Payer: Self-pay | Admitting: Cardiology

## 2020-06-06 DIAGNOSIS — R0609 Other forms of dyspnea: Secondary | ICD-10-CM

## 2020-06-06 DIAGNOSIS — R0600 Dyspnea, unspecified: Secondary | ICD-10-CM

## 2020-06-07 ENCOUNTER — Other Ambulatory Visit: Payer: Self-pay

## 2020-06-07 ENCOUNTER — Ambulatory Visit (HOSPITAL_COMMUNITY): Payer: Medicare Other

## 2020-06-07 ENCOUNTER — Telehealth: Payer: Self-pay

## 2020-06-07 DIAGNOSIS — R0609 Other forms of dyspnea: Secondary | ICD-10-CM

## 2020-06-07 DIAGNOSIS — R0600 Dyspnea, unspecified: Secondary | ICD-10-CM

## 2020-06-07 MED ORDER — METOPROLOL TARTRATE 25 MG PO TABS: 25.0000 mg | ORAL_TABLET | Freq: Two times a day (BID) | ORAL | 3 refills | Status: DC

## 2020-06-08 NOTE — Telephone Encounter (Signed)
error 

## 2020-06-09 ENCOUNTER — Ambulatory Visit (HOSPITAL_COMMUNITY): Payer: Medicare Other

## 2020-06-12 ENCOUNTER — Ambulatory Visit (HOSPITAL_COMMUNITY): Payer: Medicare Other

## 2020-06-14 ENCOUNTER — Ambulatory Visit (HOSPITAL_COMMUNITY): Payer: Medicare Other

## 2020-06-15 DIAGNOSIS — M79604 Pain in right leg: Secondary | ICD-10-CM | POA: Diagnosis not present

## 2020-06-16 ENCOUNTER — Ambulatory Visit (HOSPITAL_COMMUNITY): Payer: Medicare Other

## 2020-06-19 ENCOUNTER — Ambulatory Visit (HOSPITAL_COMMUNITY): Payer: Medicare Other

## 2020-06-21 ENCOUNTER — Ambulatory Visit (HOSPITAL_COMMUNITY): Payer: Medicare Other

## 2020-06-21 ENCOUNTER — Telehealth (HOSPITAL_COMMUNITY): Payer: Self-pay

## 2020-06-21 NOTE — Telephone Encounter (Signed)
Called and spoke with pt in regards to CR, pt stated she is not interested in reschedule at this time. Will call when she is ready.  Closed referral

## 2020-06-23 ENCOUNTER — Ambulatory Visit (HOSPITAL_COMMUNITY): Payer: Medicare Other

## 2020-06-26 ENCOUNTER — Ambulatory Visit (HOSPITAL_COMMUNITY): Payer: Medicare Other

## 2020-06-26 ENCOUNTER — Other Ambulatory Visit: Payer: Self-pay | Admitting: Cardiology

## 2020-06-28 ENCOUNTER — Ambulatory Visit (HOSPITAL_COMMUNITY): Payer: Medicare Other

## 2020-06-30 ENCOUNTER — Ambulatory Visit (HOSPITAL_COMMUNITY): Payer: Medicare Other

## 2020-06-30 DIAGNOSIS — F419 Anxiety disorder, unspecified: Secondary | ICD-10-CM | POA: Diagnosis not present

## 2020-06-30 DIAGNOSIS — E039 Hypothyroidism, unspecified: Secondary | ICD-10-CM | POA: Diagnosis not present

## 2020-06-30 DIAGNOSIS — M79605 Pain in left leg: Secondary | ICD-10-CM | POA: Diagnosis not present

## 2020-06-30 DIAGNOSIS — F331 Major depressive disorder, recurrent, moderate: Secondary | ICD-10-CM | POA: Diagnosis not present

## 2020-06-30 DIAGNOSIS — M5442 Lumbago with sciatica, left side: Secondary | ICD-10-CM | POA: Diagnosis not present

## 2020-06-30 DIAGNOSIS — M79604 Pain in right leg: Secondary | ICD-10-CM | POA: Diagnosis not present

## 2020-06-30 DIAGNOSIS — E7800 Pure hypercholesterolemia, unspecified: Secondary | ICD-10-CM | POA: Diagnosis not present

## 2020-06-30 DIAGNOSIS — M5441 Lumbago with sciatica, right side: Secondary | ICD-10-CM | POA: Diagnosis not present

## 2020-07-03 ENCOUNTER — Ambulatory Visit (HOSPITAL_COMMUNITY): Payer: Medicare Other

## 2020-07-04 ENCOUNTER — Other Ambulatory Visit: Payer: Self-pay | Admitting: Cardiology

## 2020-07-04 DIAGNOSIS — I25118 Atherosclerotic heart disease of native coronary artery with other forms of angina pectoris: Secondary | ICD-10-CM

## 2020-07-05 ENCOUNTER — Ambulatory Visit (HOSPITAL_COMMUNITY): Payer: Medicare Other

## 2020-07-06 NOTE — Telephone Encounter (Signed)
I this Rx ok to fill? Please advise.

## 2020-07-06 NOTE — Telephone Encounter (Signed)
Yes

## 2020-07-06 NOTE — Telephone Encounter (Signed)
Is this Rx ok to refill ? 

## 2020-07-07 ENCOUNTER — Ambulatory Visit (HOSPITAL_COMMUNITY): Payer: Medicare Other

## 2020-07-07 ENCOUNTER — Other Ambulatory Visit: Payer: Self-pay

## 2020-07-07 DIAGNOSIS — I25118 Atherosclerotic heart disease of native coronary artery with other forms of angina pectoris: Secondary | ICD-10-CM

## 2020-07-07 MED ORDER — RANOLAZINE ER 500 MG PO TB12: 500.0000 mg | ORAL_TABLET | Freq: Two times a day (BID) | ORAL | 6 refills | Status: DC

## 2020-07-10 ENCOUNTER — Ambulatory Visit (HOSPITAL_COMMUNITY): Payer: Medicare Other

## 2020-07-12 ENCOUNTER — Ambulatory Visit (HOSPITAL_COMMUNITY): Payer: Medicare Other

## 2020-07-14 ENCOUNTER — Ambulatory Visit (HOSPITAL_COMMUNITY): Payer: Medicare Other

## 2020-07-17 ENCOUNTER — Ambulatory Visit (HOSPITAL_COMMUNITY): Payer: Medicare Other

## 2020-07-19 ENCOUNTER — Ambulatory Visit (HOSPITAL_COMMUNITY): Payer: Medicare Other

## 2020-07-21 ENCOUNTER — Ambulatory Visit (HOSPITAL_COMMUNITY): Payer: Medicare Other

## 2020-07-24 ENCOUNTER — Ambulatory Visit (HOSPITAL_COMMUNITY): Payer: Medicare Other

## 2020-07-26 ENCOUNTER — Ambulatory Visit (HOSPITAL_COMMUNITY): Payer: Medicare Other

## 2020-07-27 ENCOUNTER — Ambulatory Visit: Payer: Medicare Other | Admitting: Cardiology

## 2020-07-28 ENCOUNTER — Ambulatory Visit (HOSPITAL_COMMUNITY): Payer: Medicare Other

## 2020-07-28 DIAGNOSIS — E7800 Pure hypercholesterolemia, unspecified: Secondary | ICD-10-CM | POA: Diagnosis not present

## 2020-07-28 DIAGNOSIS — M79605 Pain in left leg: Secondary | ICD-10-CM | POA: Diagnosis not present

## 2020-07-28 DIAGNOSIS — F331 Major depressive disorder, recurrent, moderate: Secondary | ICD-10-CM | POA: Diagnosis not present

## 2020-07-28 DIAGNOSIS — M5441 Lumbago with sciatica, right side: Secondary | ICD-10-CM | POA: Diagnosis not present

## 2020-07-28 DIAGNOSIS — M79604 Pain in right leg: Secondary | ICD-10-CM | POA: Diagnosis not present

## 2020-07-28 DIAGNOSIS — M069 Rheumatoid arthritis, unspecified: Secondary | ICD-10-CM | POA: Diagnosis not present

## 2020-07-28 DIAGNOSIS — F419 Anxiety disorder, unspecified: Secondary | ICD-10-CM | POA: Diagnosis not present

## 2020-07-28 DIAGNOSIS — E039 Hypothyroidism, unspecified: Secondary | ICD-10-CM | POA: Diagnosis not present

## 2020-07-28 DIAGNOSIS — M5442 Lumbago with sciatica, left side: Secondary | ICD-10-CM | POA: Diagnosis not present

## 2020-08-17 ENCOUNTER — Encounter: Payer: Self-pay | Admitting: Cardiology

## 2020-08-17 ENCOUNTER — Other Ambulatory Visit: Payer: Self-pay

## 2020-08-17 ENCOUNTER — Ambulatory Visit: Payer: Medicare Other | Admitting: Cardiology

## 2020-08-17 VITALS — BP 114/61 | HR 56 | Resp 16 | Ht 66.0 in | Wt 176.0 lb

## 2020-08-17 DIAGNOSIS — I10 Essential (primary) hypertension: Secondary | ICD-10-CM

## 2020-08-17 DIAGNOSIS — E119 Type 2 diabetes mellitus without complications: Secondary | ICD-10-CM

## 2020-08-17 DIAGNOSIS — I25118 Atherosclerotic heart disease of native coronary artery with other forms of angina pectoris: Secondary | ICD-10-CM | POA: Diagnosis not present

## 2020-08-17 DIAGNOSIS — E782 Mixed hyperlipidemia: Secondary | ICD-10-CM

## 2020-08-17 DIAGNOSIS — Z95820 Peripheral vascular angioplasty status with implants and grafts: Secondary | ICD-10-CM | POA: Diagnosis not present

## 2020-08-17 DIAGNOSIS — R931 Abnormal findings on diagnostic imaging of heart and coronary circulation: Secondary | ICD-10-CM | POA: Diagnosis not present

## 2020-08-17 MED ORDER — CLOPIDOGREL BISULFATE 75 MG PO TABS: 75.0000 mg | ORAL_TABLET | Freq: Every day | ORAL | 0 refills | Status: DC

## 2020-08-17 NOTE — Progress Notes (Signed)
 Primary Physician/Referring:  [name], [name], MD  Patient ID: Jessica [name], female    DOB: 12/18/1950, 69 y.o.   MRN: [id number]  Chief Complaint  Patient presents with  . Coronary Artery Disease  . Palpitations  . Hypertension  . Follow-up    3 months   HPI:    Jessica [name]  is a 69 y.o. Caucasian female patient with hypertension, hyperlipidemia, diabetes mellitus with peripheral neuropathy, chronic stable angina pectoris and dyspnea on exertion and coronary artery disease. Her physical activity is limited due to spinal disease.  She also has a 40+ pack year history of smoking cigarettes.    She has high coronary calcium score and cardiac catheterization revealing high-grade ostial RCA stenosis and mid LAD moderate stenosis, after review of previously performed attempted angioplasty, I had set her up for cardiac catheterization on 04/18/2020 with successful revascularization of the ostial RCA.    She now presents for follow-up, has noticed marked improvement in dyspnea and has not had any recurrence of angina pectoris.  She has resumed most of her activities.  The only complaint she had is arthritis in her knee and also bilateral ankle pain and foot pain from diabetic neuropathy and is wondering if she could take Neurontin 3 times a day.     Past Medical History:  Diagnosis Date  . Allergy   . Anxiety   . Asthma   . Cervical stenosis of spine   . COPD (chronic obstructive pulmonary disease) (HCC)   . DDD (degenerative disc disease)   . Depression   . Diabetes mellitus without complication (HCC)   . Fatty liver   . Gastric ulcer   . GERD (gastroesophageal reflux disease)   . Heart murmur   . Hyperglycemia   . Hyperlipidemia   . Hypothyroidism   . Irregular heartbeat   . Migraines   . Multiple gastric ulcers   . Obesity   . Osteoarthritis   . Rheumatoid arthritis (HCC)   . Sciatica   . Scoliosis   . Vitamin D deficiency    Past Surgical History:  Procedure Laterality Date   . ANTERIOR CERVICAL DECOMP/DISCECTOMY FUSION  12/08/2017   Procedure: Anterior Cervical Decompression/Discectomy Fusion - Cervical four-Cervical five - Cervical five-Cervical six - Cervical six-Cervical seven;  Surgeon: Pool, Henry, MD;  Location: [facility] OR;  Service: Neurosurgery;;  . APPENDECTOMY    . CHOLECYSTECTOMY  2010  . COLONOSCOPY    . COLOSTOMY     removed 3 inches of colon 1987  . 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 CORONARY ATHERECTOMY  02/29/2020  . CORONARY ATHERECTOMY N/A 04/18/2020   Procedure: CORONARY ATHERECTOMY;  Surgeon: Ganji, Jay, MD;  Location: [facility];  Service: Cardiovascular;  Laterality: N/A;  Ostial RCA  . CORONARY BALLOON ANGIOPLASTY N/A 02/29/2020   Procedure: CORONARY BALLOON ANGIOPLASTY;  Surgeon: Patwardhan, Manish J, MD;  Location: [facility];  Service: Cardiovascular;  Laterality: N/A;  . CORONARY STENT INTERVENTION N/A 04/18/2020   Procedure: CORONARY STENT INTERVENTION;  Surgeon: Ganji, Jay, MD;  Location: [facility];  Service: Cardiovascular;  Laterality: N/A;  Ostial RCA  . HAND SURGERY Right 2006  . INTRAVASCULAR ULTRASOUND/IVUS N/A 02/29/2020   Procedure: Intravascular Ultrasound/IVUS;  Surgeon: Patwardhan, Manish J, MD;  Location: [facility];  Service: Cardiovascular;  Laterality: N/A;  . LEFT HEART CATH N/A 02/29/2020   Procedure: Left Heart Cath;  Surgeon: Patwardhan, Manish J, MD;  Location: [facility]   [facility];  Service: Cardiovascular;  Laterality: N/A;  . LEFT HEART CATH AND CORONARY ANGIOGRAPHY N/A 02/15/2020   Procedure: LEFT HEART CATH AND CORONARY ANGIOGRAPHY;  Surgeon: Nigel Mormon, MD;  Location: [place] [facility];  Service: Cardiovascular;  Laterality: N/A;  . Perforated Colon  1986   by Dr. Margot Chimes  . POLYPECTOMY    . TOTAL ABDOMINAL HYSTERECTOMY  1989   Dr. Nori Riis  . UPPER GASTROINTESTINAL ENDOSCOPY    . UPPER GI ENDOSCOPY     Family History  Problem Relation Age of Onset  . Heart  attack Father   . Heart disease Mother   . Hypertension Mother   . Breast cancer Sister   . Diabetes Brother   . Stroke Brother   . Colon cancer Neg Hx   . Esophageal cancer Neg Hx   . Liver cancer Neg Hx   . Pancreatic cancer Neg Hx   . Rectal cancer Neg Hx   . Stomach cancer Neg Hx     Social History   Tobacco Use  . Smoking status: Former Smoker    Packs/day: 1.00    Years: 40.00    Pack years: 40.00    Types: Cigarettes    Quit date: 12/08/2017    Years since quitting: 2.6  . Smokeless tobacco: Never Used  Substance Use Topics  . Alcohol use: No   Marital Status: Married  ROS  Review of Systems  Constitutional: Positive for malaise/fatigue.  Cardiovascular: Negative for chest pain, dyspnea on exertion and leg swelling.  Musculoskeletal: Positive for arthritis, back pain, joint pain, muscle cramps, muscle weakness and neck pain.  Gastrointestinal: Negative for melena.   Objective  Blood pressure 114/61, pulse (!) 56, resp. rate 16, height 5' 6" (1.676 m), weight 176 lb (79.8 kg), SpO2 95 %.  Vitals with BMI 08/17/2020 04/26/2020 04/18/2020  Height 5' 6" 5' 6" -  Weight 176 lbs 169 lbs -  BMI 79.89 21.19 -  Systolic 417 408 144  Diastolic 61 49 50  Pulse 56 41 64     Physical Exam Constitutional:      Comments: Appears older than stated age.   Cardiovascular:     Rate and Rhythm: Normal rate and regular rhythm.     Pulses:          Carotid pulses are 2+ on the right side and 2+ on the left side.      Popliteal pulses are 2+ on the right side and 2+ on the left side.       Dorsalis pedis pulses are 2+ on the right side and 1+ on the left side.       Posterior tibial pulses are 2+ on the right side and 2+ on the left side.     Heart sounds: No murmur heard.  No gallop.      Comments: No JVD. No pedal edema. Pulmonary:     Effort: Pulmonary effort is normal. No accessory muscle usage or respiratory distress.     Breath sounds: Normal breath sounds.  Abdominal:      General: Bowel sounds are normal.     Palpations: Abdomen is soft.    Laboratory examination:   Recent Labs    02/15/20 0606 02/15/20 0606 02/29/20 1041 03/01/20 0223 04/13/20 1305  NA 141   < > 138 138 142  K 3.7   < > 5.0 3.8 4.2  CL 105   < > 104 108 103  CO2 23  --   --  21* 23  GLUCOSE 102*   < > 147* 120* 139*  BUN 13   < > 8 7* 7*  CREATININE 0.70   < > 0.50 0.55 0.51*  CALCIUM 8.8*  --   --  7.7* 9.0  GFRNONAA >60  --   --  >60 101  GFRAA >60  --   --  >60 116   < > = values in this interval not displayed.   CrCl cannot be calculated (Patient's most recent lab result is older than the maximum 21 days allowed.).  CMP Latest Ref Rng & Units 04/13/2020 03/01/2020 02/29/2020  Glucose 65 - 99 mg/dL 139(H) 120(H) 147(H)  BUN 8 - 27 mg/dL 7(L) 7(L) 8  Creatinine 0.57 - 1.00 mg/dL 0.51(L) 0.55 0.50  Sodium 134 - 144 mmol/L 142 138 138  Potassium 3.5 - 5.2 mmol/L 4.2 3.8 5.0  Chloride 96 - 106 mmol/L 103 108 104  CO2 20 - 29 mmol/L 23 21(L) -  Calcium 8.7 - 10.3 mg/dL 9.0 7.7(L) -  Total Protein 6.5 - 8.1 g/dL - - -  Total Bilirubin 0.3 - 1.2 mg/dL - - -  Alkaline Phos 38 - 126 U/L - - -  AST 15 - 41 U/L - - -  ALT 0 - 44 U/L - - -   CBC Latest Ref Rng & Units 04/13/2020 03/01/2020 02/29/2020  WBC 3.4 - 10.8 x10E3/uL 6.5 11.3(H) -  Hemoglobin 11.1 - 15.9 g/dL 14.0 11.2(L) 12.2  Hematocrit 34.0 - 46.6 % 40.0 32.8(L) 36.0  Platelets 150 - 400 K/uL - 250 -   Lipid Panel  No results found for: CHOL, TRIG, HDL, CHOLHDL, VLDL, LDLCALC, LDLDIRECT HEMOGLOBIN A1C Lab Results  Component Value Date   HGBA1C 5.5 02/29/2020   MPG 111.15 02/29/2020   TSH No results for input(s): TSH in the last 8760 hours.  External labs:   Cholesterol, total 359.000 M 02/15/2019 HDL 62.000 06/02/2020 LDL-C 48.000 06/02/2020 Triglycerides 136.000 06/02/2020  A1C 5.500 02/29/2020 TSH 0.010 06/02/2020  Hemoglobin 14.000 G/ 04/13/2020  Creatinine, Serum 0.510 04/13/2020 Potassium 4.200  04/13/2020 ALT (SGPT) 22.000 09/25/2019  01/24/2020: Glucose 87, BUN/Cr 19/0.87. EGFR >=60. Na/K 139/5.0. Rest of the CMP normal H/H 15.1/43.4. MCV 94.8. Platelets 227  Cholesterol, total 359.000 M 02/15/2019 Triglycerides 352.000 09/13/2019 HDL 53.000 M 02/15/2019 LDL 140.000 09/24/2017  Medications and allergies   Allergies  Allergen Reactions  . Adhesive [Tape] Itching, Rash and Other (See Comments)    Paper tape okay  . Latex Rash  . Oxycodone     Loopy, felt sick  . Remicade [Infliximab] Itching    Current Outpatient Medications on File Prior to Visit  Medication Sig Dispense Refill  . acetaminophen (TYLENOL) 500 MG tablet Take 500 mg by mouth every 6 (six) hours as needed for moderate pain or headache.    . ALPRAZolam (XANAX) 0.5 MG tablet Take 0.5 mg by mouth 3 (three) times daily.     Marland Kitchen amLODipine (NORVASC) 5 MG tablet Take 1 tablet (5 mg total) by mouth daily. 90 tablet 1  . aspirin EC 81 MG tablet Take 1 tablet (81 mg total) by mouth daily. 90 tablet 3  . butalbital-acetaminophen-caffeine (FIORICET, ESGIC) 50-325-40 MG per tablet Take 1 tablet by mouth 3 (three) times daily as needed for headache or migraine.     . cholestyramine (QUESTRAN) 4 g packet Take 4 g by mouth in the morning and at bedtime.    . dicyclomine (BENTYL) 20 MG tablet Take 20 mg  by mouth 2 (two) times daily.    . ergocalciferol (VITAMIN D2) 50000 UNITS capsule Take 50,000 Units by mouth every Thursday.     . estradiol (ESTRACE) 2 MG tablet Take 2 mg by mouth daily.    . gabapentin (NEURONTIN) 300 MG capsule Take 300 mg by mouth 2 (two) times daily.     . glimepiride (AMARYL) 4 MG tablet Take 4 mg by mouth in the morning and at bedtime.     . levothyroxine (SYNTHROID) 175 MCG tablet Take 175 mcg by mouth daily before breakfast.    . loperamide (IMODIUM A-D) 2 MG tablet Take 4 mg by mouth 3 (three) times daily as needed for diarrhea or loose stools.    . metoprolol tartrate (LOPRESSOR) 25 MG tablet Take  1 tablet (25 mg total) by mouth 2 (two) times daily. 180 tablet 3  . Multiple Vitamin (MULTIVITAMIN) tablet Take 1 tablet by mouth daily.    . nitroGLYCERIN (NITROSTAT) 0.4 MG SL tablet Place 1 tablet (0.4 mg total) under the tongue every 5 (five) minutes as needed for chest pain. 30 tablet 2  . pioglitazone (ACTOS) 15 MG tablet Take 15 mg by mouth daily.    . Potassium Chloride ER 20 MEQ TBCR Take 20 mEq by mouth 2 (two) times daily.     . promethazine (PHENERGAN) 25 MG suppository Place 1 suppository (25 mg total) rectally every 8 (eight) hours as needed for nausea or vomiting. 6 each 0  . promethazine (PHENERGAN) 25 MG tablet Take 25 mg by mouth every 6 (six) hours as needed for nausea or vomiting.    . ranolazine (RANEXA) 500 MG 12 hr tablet Take 1 tablet (500 mg total) by mouth 2 (two) times daily. 60 tablet 6   No current facility-administered medications on file prior to visit.    Radiology:   No results found.  Cardiac Studies:   CT Cardiac scoring 02/01/2020: 1. Calcium score of 868. This is between the 90th and 100 percentile for females between the ages of 65 and 69. This is consistent with extensive atherosclerotic plaque. There is high likelihood of at least one significant coronary artery narrowing.  Lexiscan Tetrofosmin stress test 02/09/2020: Stress EKG is non-diagnostic, as this is pharmacological stress test. Rest and stress EKG show sinus rhythm with ventricular bigeminy/trigeminy. Small size, mild intensity, reversible perfusion defect in basal anteroseptal myocardium. Stress LVEF 49%. Intermediate risk stress test..  I had reviewed the angiograms after prior failed attempt and set her up for angiography  Echocardiogram 02/10/2020: Left ventricle cavity is normal in size. Moderate concentric hypertrophy of the left ventricle. Normal global wall motion. Normal LV systolic function with visual EF 55-60%. Diastolic function not assessed due to ventricular bigeminy.  Left  atrial cavity is mildly dilated. Mild to moderate mitral regurgitation. IVC is dilated with respiratory variation. Estimated RA pressure 8 mmHg.  Coronary angiogram 02/15/2020: LM: Short, normal. LAD: Small to medium caliber vessel with prox-mid, eccentric 60-70% stenosis with moderate calcification.  Does not reach the apex.          Occluded calcific Diag 1, likely originating at the site of LAD stenosis.         Under filled apical LAD         Left-to-left (LCx-LAD/Diag) and right-to-left (RCA-LAD/Diag) collaterals LCx: Large vessel. Prox 30% stenosis. RCA: Ostial, eccentric 80% stenosis with moderate calcification.   LVEDP normal.  Coronary angioplasty 04/18/2020  Ost RCA to Prox RCA lesion is 80% stenosed.  SP   CSI orbital atherectomy with [place] followed by  [place] [id number].  Stenosis reduced to 0%.  Rec: Continue aspirin indefinitely and Plavix for 6 months, can be discharged home today with outpatient follow-up.  ABI performed at PCP office 06/15/2020: Right ABI 1.1, left ABI 1.06.  Triphasic waveforms bilaterally.  Normal study.   EKG  EKG 04/26/2020: Normal sinus rhythm with rate of seventy-seven beats minute, normal axis, frequent PVCs in the pattern of ventricular bigeminy.  No evidence of ischemia.  Compared to 02/24/2020, frequent PVCs new.  But these were present previously.    Assessment     ICD-10-CM   1. Status post angioplasty with stent  Z95.820 clopidogrel (PLAVIX) 75 MG tablet  2. Coronary artery disease involving native coronary artery of native heart with other form of angina pectoris (HCC)  I25.118 clopidogrel (PLAVIX) 75 MG tablet  3. Primary hypertension  I10   4. Mixed hyperlipidemia  E78.2   5. Controlled type 2 diabetes mellitus without complication, without long-term current use of insulin (HCC)  E11.9      Meds ordered this encounter  Medications  . clopidogrel (PLAVIX) 75 MG tablet    Sig: Take 1 tablet (75 mg total) by mouth  daily.    Dispense:  90 tablet    Refill:  0    Medications Discontinued During This Encounter  Medication Reason  . pantoprazole (PROTONIX) 40 MG tablet Patient Preference  . DULoxetine (CYMBALTA) 30 MG capsule Patient Preference  . clopidogrel (PLAVIX) 75 MG tablet Reorder  . atorvastatin (LIPITOR) 40 MG tablet Side effect (s)    Recommendations:   Jessica [name]  is a 69 y.o. Caucasian female patient with hypertension, hyperlipidemia, diabetes mellitus with peripheral neuropathy, chronic stable angina pectoris and dyspnea on exertion and coronary artery disease. Her physical activity is limited due to spinal disease.  She also has a 40+ pack year history of smoking cigarettes.  She underwent successful angioplasty and stenting to the ostial RCA on 04/18/2020 with implantation of a 3.5 x 22 mm resolute.  Has diffuse disease of 60% in the mid LAD which can be treated medically and it is small.  She is also on aggressive lipid-lowering therapy now.  Patient presents here for a 42-monthoffice visit and follow-up, she has not had any further angina pectoris but her main complaint being severe leg cramps and also muscle cramps that started in the legs, she underwent ABI in the outpatient basis from her PCP which were normal.    Her lipids are now very well controlled.  I reviewed her external labs. The symptoms of severe muscle pain which is now spread to her upper extremity  Is probably related to statin induced myalgias.  I have advised her to hold atorvastatin 40 mg for now for the next 1 month, will give her a statin holiday, if her symptoms improve will consider switching her to a different statin.  If there is no change in symptoms, she is advised to restart the statin.  From cardiac standpoint she has not had any angina pectoris, will continue dual antiplatelet therapy at least for a total of 6 to 8 months.  I refilled her Plavix. She should be able to stop statin after this course. Blood  pressure is well controlled.  I will see her back in 3 months for close monitoring.   JAdrian Prows MD, FOptima Ophthalmic Medical Associates Inc9/16/2021, 12:29 PM Office: [phone]

## 2020-08-17 NOTE — Patient Instructions (Signed)
Hold atorvastatin for 1 month.  If you pain and muscle weakness improves, please call us back so we can prescribe you a new cholesterol medication.  However if there is no change in your aches and pains, please restart atorvastatin as it is a cholesterol medicine that protects your heart from future heart attacks.

## 2020-08-18 DIAGNOSIS — N8110 Cystocele, unspecified: Secondary | ICD-10-CM | POA: Diagnosis not present

## 2020-08-18 DIAGNOSIS — N63 Unspecified lump in unspecified breast: Secondary | ICD-10-CM | POA: Diagnosis not present

## 2020-08-18 DIAGNOSIS — Z803 Family history of malignant neoplasm of breast: Secondary | ICD-10-CM | POA: Diagnosis not present

## 2020-08-19 ENCOUNTER — Encounter: Payer: Self-pay | Admitting: Cardiology

## 2020-08-30 DIAGNOSIS — Z23 Encounter for immunization: Secondary | ICD-10-CM | POA: Diagnosis not present

## 2020-08-31 DIAGNOSIS — N6002 Solitary cyst of left breast: Secondary | ICD-10-CM | POA: Diagnosis not present

## 2020-08-31 DIAGNOSIS — R922 Inconclusive mammogram: Secondary | ICD-10-CM | POA: Diagnosis not present

## 2020-09-07 ENCOUNTER — Other Ambulatory Visit: Payer: Self-pay | Admitting: Cardiology

## 2020-09-07 DIAGNOSIS — I25118 Atherosclerotic heart disease of native coronary artery with other forms of angina pectoris: Secondary | ICD-10-CM

## 2020-10-03 ENCOUNTER — Other Ambulatory Visit: Payer: Self-pay

## 2020-10-03 DIAGNOSIS — N6011 Diffuse cystic mastopathy of right breast: Secondary | ICD-10-CM | POA: Diagnosis not present

## 2020-10-08 ENCOUNTER — Other Ambulatory Visit: Payer: Self-pay

## 2020-10-08 ENCOUNTER — Other Ambulatory Visit: Payer: Self-pay | Admitting: Cardiology

## 2020-10-08 DIAGNOSIS — I25118 Atherosclerotic heart disease of native coronary artery with other forms of angina pectoris: Secondary | ICD-10-CM

## 2020-10-09 ENCOUNTER — Other Ambulatory Visit: Payer: Self-pay

## 2020-10-09 DIAGNOSIS — I25118 Atherosclerotic heart disease of native coronary artery with other forms of angina pectoris: Secondary | ICD-10-CM

## 2020-10-09 MED ORDER — RANOLAZINE ER 500 MG PO TB12: 500.0000 mg | ORAL_TABLET | Freq: Two times a day (BID) | ORAL | 6 refills | Status: DC

## 2020-10-09 MED ORDER — RANOLAZINE ER 500 MG PO TB12: 500.0000 mg | ORAL_TABLET | Freq: Two times a day (BID) | ORAL | 0 refills | Status: DC

## 2020-10-09 NOTE — Telephone Encounter (Signed)
From patient. Refill request.

## 2020-10-13 DIAGNOSIS — E7800 Pure hypercholesterolemia, unspecified: Secondary | ICD-10-CM | POA: Diagnosis not present

## 2020-10-13 DIAGNOSIS — E118 Type 2 diabetes mellitus with unspecified complications: Secondary | ICD-10-CM | POA: Diagnosis not present

## 2020-10-13 DIAGNOSIS — I10 Essential (primary) hypertension: Secondary | ICD-10-CM | POA: Diagnosis not present

## 2020-10-16 ENCOUNTER — Other Ambulatory Visit: Payer: Self-pay | Admitting: Cardiology

## 2020-10-16 DIAGNOSIS — I25118 Atherosclerotic heart disease of native coronary artery with other forms of angina pectoris: Secondary | ICD-10-CM

## 2020-10-20 DIAGNOSIS — E1159 Type 2 diabetes mellitus with other circulatory complications: Secondary | ICD-10-CM | POA: Diagnosis not present

## 2020-10-20 DIAGNOSIS — E7800 Pure hypercholesterolemia, unspecified: Secondary | ICD-10-CM | POA: Diagnosis not present

## 2020-10-20 DIAGNOSIS — E559 Vitamin D deficiency, unspecified: Secondary | ICD-10-CM | POA: Diagnosis not present

## 2020-10-20 DIAGNOSIS — F419 Anxiety disorder, unspecified: Secondary | ICD-10-CM | POA: Diagnosis not present

## 2020-10-20 DIAGNOSIS — F4321 Adjustment disorder with depressed mood: Secondary | ICD-10-CM | POA: Diagnosis not present

## 2020-10-20 DIAGNOSIS — I10 Essential (primary) hypertension: Secondary | ICD-10-CM | POA: Diagnosis not present

## 2020-10-20 DIAGNOSIS — M0609 Rheumatoid arthritis without rheumatoid factor, multiple sites: Secondary | ICD-10-CM | POA: Diagnosis not present

## 2020-10-20 DIAGNOSIS — Z79899 Other long term (current) drug therapy: Secondary | ICD-10-CM | POA: Diagnosis not present

## 2020-10-20 DIAGNOSIS — E1140 Type 2 diabetes mellitus with diabetic neuropathy, unspecified: Secondary | ICD-10-CM | POA: Diagnosis not present

## 2020-10-20 DIAGNOSIS — E039 Hypothyroidism, unspecified: Secondary | ICD-10-CM | POA: Diagnosis not present

## 2020-10-20 DIAGNOSIS — Z0000 Encounter for general adult medical examination without abnormal findings: Secondary | ICD-10-CM | POA: Diagnosis not present

## 2020-11-20 ENCOUNTER — Other Ambulatory Visit: Payer: Self-pay

## 2020-11-20 ENCOUNTER — Ambulatory Visit: Payer: Medicare Other | Admitting: Cardiology

## 2020-11-20 ENCOUNTER — Encounter: Payer: Self-pay | Admitting: Cardiology

## 2020-11-20 VITALS — BP 123/70 | HR 51 | Resp 16 | Ht 66.0 in | Wt 188.0 lb

## 2020-11-20 DIAGNOSIS — R931 Abnormal findings on diagnostic imaging of heart and coronary circulation: Secondary | ICD-10-CM | POA: Diagnosis not present

## 2020-11-20 DIAGNOSIS — I25118 Atherosclerotic heart disease of native coronary artery with other forms of angina pectoris: Secondary | ICD-10-CM | POA: Diagnosis not present

## 2020-11-20 DIAGNOSIS — R0989 Other specified symptoms and signs involving the circulatory and respiratory systems: Secondary | ICD-10-CM

## 2020-11-20 DIAGNOSIS — E782 Mixed hyperlipidemia: Secondary | ICD-10-CM

## 2020-11-20 MED ORDER — EZETIMIBE-SIMVASTATIN 10-20 MG PO TABS: 1.0000 | ORAL_TABLET | Freq: Every day | ORAL | 2 refills | Status: DC

## 2020-11-20 NOTE — Progress Notes (Signed)
Primary Physician/Referring:  [name], DO  Patient ID: Jessica [name], female    DOB: Oct 28, 1951, 69 y.o.   MRN: [id number]  Chief Complaint  Patient presents with  . Coronary Artery Disease  . Hyperlipidemia  . Follow-up   HPI:    Jessica [name]  is a 69 y.o. Caucasian female patient with hypertension, hyperlipidemia, diabetes mellitus with peripheral neuropathy, chronic stable angina pectoris and dyspnea on exertion and coronary artery disease s/p she called same stenting to RCA, moderate disease in mid LAD being treated medically Jessica [name] also. Her physical activity is limited due to spinal disease.  She also has a 40+ pack year history of smoking cigarettes.   On her last office visit I discontinued atorvastatin due to severe myalgias, she has had complete resolution of severe pain in her legs and also her arms. She has not had any angina pectoris, states that her severe aches and pains in her arms and also lower extremity has improved significantly since stopping statins. She now presents for follow-up.     Past Medical History:  Diagnosis Date  . Allergy   . Anxiety   . Asthma   . Cervical stenosis of spine   . COPD (chronic obstructive pulmonary disease) ([place])   . DDD (degenerative disc disease)   . Depression   . Diabetes mellitus without complication ([place])   . Fatty liver   . Gastric ulcer   . GERD (gastroesophageal reflux disease)   . Heart murmur   . Hyperglycemia   . Hyperlipidemia   . Hypothyroidism   . Irregular heartbeat   . Migraines   . Multiple gastric ulcers   . Obesity   . Osteoarthritis   . Rheumatoid arthritis ([place])   . Sciatica   . Scoliosis   . Vitamin D deficiency    Past Surgical History:  Procedure Laterality Date  . ANTERIOR CERVICAL DECOMP/DISCECTOMY FUSION  12/08/2017   Procedure: Anterior Cervical Decompression/Discectomy Fusion - Cervical four-Cervical five - Cervical five-Cervical six - Cervical six-Cervical seven;  Surgeon: Earnie Larsson,  MD;  Location: [facility];  Service: Neurosurgery;;  . APPENDECTOMY    . CHOLECYSTECTOMY  2010  . COLONOSCOPY    . COLOSTOMY     removed 3 inches of colon 1987  . 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 CORONARY ATHERECTOMY N/A 04/18/2020   Procedure: CORONARY ATHERECTOMY;  Surgeon: Adrian Prows, MD;  Location: [facility];  Service: Cardiovascular;  Laterality: N/A;  Ostial RCA  . CORONARY BALLOON ANGIOPLASTY N/A 02/29/2020   Procedure: CORONARY BALLOON ANGIOPLASTY;  Surgeon: Nigel Mormon, MD;  Location: [facility];  Service: Cardiovascular;  Laterality: N/A;  . CORONARY STENT INTERVENTION N/A 04/18/2020   Procedure: CORONARY STENT INTERVENTION;  Surgeon: Adrian Prows, MD;  Location: [facility];  Service: Cardiovascular;  Laterality: N/A;  Ostial RCA  . HAND SURGERY Right 2006  . INTRAVASCULAR ULTRASOUND/IVUS N/A 02/29/2020   Procedure: Intravascular Ultrasound/IVUS;  Surgeon: Nigel Mormon, MD;  Location: [facility];  Service: Cardiovascular;  Laterality: N/A;  . LEFT HEART CATH N/A 02/29/2020   Procedure: Left Heart Cath;  Surgeon: Nigel Mormon, MD;  Location: [facility];  Service: Cardiovascular;  Laterality: N/A;  . LEFT HEART CATH AND CORONARY ANGIOGRAPHY N/A 02/15/2020   Procedure: LEFT HEART CATH AND CORONARY ANGIOGRAPHY;  Surgeon: Nigel Mormon, MD;  Location: [facility];  Service:  Cardiovascular;  Laterality: N/A;  . Perforated Colon  1986   by Dr. Margot Chimes  . POLYPECTOMY    . TOTAL ABDOMINAL HYSTERECTOMY  1989   Dr. Nori Riis  . UPPER GASTROINTESTINAL ENDOSCOPY    . UPPER GI ENDOSCOPY     Family History  Problem Relation Age of Onset  . Heart attack Father   . Heart disease Mother   . Hypertension Mother   . Breast cancer Sister   . Diabetes Brother   . Stroke Brother   . Colon cancer Neg Hx   . Esophageal cancer Neg Hx   . Liver cancer Neg Hx   . Pancreatic cancer  Neg Hx   . Rectal cancer Neg Hx   . Stomach cancer Neg Hx     Social History   Tobacco Use  . Smoking status: Former Smoker    Packs/day: 1.00    Years: 40.00    Pack years: 40.00    Types: Cigarettes    Quit date: 12/08/2017    Years since quitting: 2.9  . Smokeless tobacco: Never Used  Substance Use Topics  . Alcohol use: No   Marital Status: Married  ROS  Review of Systems  Constitutional: Positive for malaise/fatigue.  Cardiovascular: Negative for chest pain, dyspnea on exertion and leg swelling.  Musculoskeletal: Positive for arthritis, back pain, joint pain and neck pain. Negative for muscle cramps and muscle weakness.  Gastrointestinal: Negative for melena.   Objective  Blood pressure 123/70, pulse (!) 51, resp. rate 16, height 5' 6"  (1.676 m), weight 188 lb (85.3 kg), SpO2 96 %.  Vitals with BMI 11/20/2020 08/17/2020 04/26/2020  Height 5' 6"  5' 6"  5' 6"   Weight 188 lbs 176 lbs 169 lbs  BMI 30.36 19.41 74.08  Systolic 144 818 563  Diastolic 70 61 49  Pulse 51 56 41     Physical Exam Constitutional:      Comments: Appears older than stated age.   Cardiovascular:     Rate and Rhythm: Normal rate and regular rhythm.     Pulses:          Carotid pulses are 2+ on the right side and 2+ on the left side.      Popliteal pulses are 2+ on the right side and 2+ on the left side.       Dorsalis pedis pulses are 2+ on the right side and 1+ on the left side.       Posterior tibial pulses are 2+ on the right side and 2+ on the left side.     Heart sounds: No murmur heard. No gallop.      Comments: No JVD. No pedal edema. Pulmonary:     Effort: Pulmonary effort is normal. No accessory muscle usage or respiratory distress.     Breath sounds: Normal breath sounds.  Abdominal:     General: Bowel sounds are normal.     Palpations: Abdomen is soft.    Laboratory examination:   Recent Labs    02/15/20 0606 02/29/20 1041 03/01/20 0223 04/13/20 1305  NA 141 138 138 142   K 3.7 5.0 3.8 4.2  CL 105 104 108 103  CO2 23  --  21* 23  GLUCOSE 102* 147* 120* 139*  BUN 13 8 7* 7*  CREATININE 0.70 0.50 0.55 0.51*  CALCIUM 8.8*  --  7.7* 9.0  GFRNONAA >60  --  >60 101  GFRAA >60  --  >60 116   CrCl cannot be calculated (  Patient's most recent lab result is older than the maximum 21 days allowed.).  CMP Latest Ref Rng & Units 04/13/2020 03/01/2020 02/29/2020  Glucose 65 - 99 mg/dL 139(H) 120(H) 147(H)  BUN 8 - 27 mg/dL 7(L) 7(L) 8  Creatinine 0.57 - 1.00 mg/dL 0.51(L) 0.55 0.50  Sodium 134 - 144 mmol/L 142 138 138  Potassium 3.5 - 5.2 mmol/L 4.2 3.8 5.0  Chloride 96 - 106 mmol/L 103 108 104  CO2 20 - 29 mmol/L 23 21(L) -  Calcium 8.7 - 10.3 mg/dL 9.0 7.7(L) -  Total Protein 6.5 - 8.1 g/dL - - -  Total Bilirubin 0.3 - 1.2 mg/dL - - -  Alkaline Phos 38 - 126 U/L - - -  AST 15 - 41 U/L - - -  ALT 0 - 44 U/L - - -   CBC Latest Ref Rng & Units 04/13/2020 03/01/2020 02/29/2020  WBC 3.4 - 10.8 x10E3/uL 6.5 11.3(H) -  Hemoglobin 11.1 - 15.9 g/dL 14.0 11.2(L) 12.2  Hematocrit 34.0 - 46.6 % 40.0 32.8(L) 36.0  Platelets 150 - 400 K/uL - 250 -    External labs:     Cholesterol, total 359.000 M 02/15/2019 HDL 62.000 06/02/2020 LDL-C 48.000 06/02/2020 Triglycerides 136.000 06/02/2020  A1C 5.500 02/29/2020 TSH 0.010 06/02/2020  Hemoglobin 14.000 G/ 04/13/2020  Creatinine, Serum 0.510 04/13/2020 Potassium 4.200 04/13/2020 ALT (SGPT) 22.000 09/25/2019  01/24/2020: Glucose 87, BUN/Cr 19/0.87. EGFR >=60. Na/K 139/5.0. Rest of the CMP normal H/H 15.1/43.4. MCV 94.8. Platelets 227  Cholesterol, total 359.000 M 02/15/2019 Triglycerides 352.000 09/13/2019 HDL 53.000 M 02/15/2019 LDL 140.000 09/24/2017  Medications and allergies   Allergies  Allergen Reactions  . Atorvastatin Other (See Comments)    Severe myalgia  . Adhesive [Tape] Itching, Rash and Other (See Comments)    Paper tape okay  . Latex Rash  . Oxycodone     Loopy, felt sick  . Remicade [Infliximab]  Itching    Current Outpatient Medications on File Prior to Visit  Medication Sig Dispense Refill  . acetaminophen (TYLENOL) 500 MG tablet Take 500 mg by mouth every 6 (six) hours as needed for moderate pain or headache.    . ALPRAZolam (XANAX) 0.5 MG tablet Take 0.5 mg by mouth 3 (three) times daily.    Marland Kitchen amLODipine (NORVASC) 5 MG tablet Take 1 tablet by mouth once daily 90 tablet 0  . aspirin EC 81 MG tablet Take 1 tablet (81 mg total) by mouth daily. 90 tablet 3  . butalbital-acetaminophen-caffeine (FIORICET, ESGIC) 50-325-40 MG per tablet Take 1 tablet by mouth 3 (three) times daily as needed for headache or migraine.     . cholestyramine (QUESTRAN) 4 g packet Take 4 g by mouth in the morning and at bedtime.    . clopidogrel (PLAVIX) 75 MG tablet Take 1 tablet (75 mg total) by mouth daily. 90 tablet 0  . dicyclomine (BENTYL) 20 MG tablet Take 20 mg by mouth 2 (two) times daily.    . ergocalciferol (VITAMIN D2) 50000 UNITS capsule Take 50,000 Units by mouth every Thursday.     . estradiol (ESTRACE) 2 MG tablet Take 2 mg by mouth daily.    Marland Kitchen gabapentin (NEURONTIN) 300 MG capsule Take 300 mg by mouth 2 (two) times daily.    Marland Kitchen glimepiride (AMARYL) 4 MG tablet Take 4 mg by mouth in the morning and at bedtime.     Marland Kitchen loperamide (IMODIUM A-D) 2 MG tablet Take 4 mg by mouth 3 (three) times daily as  needed for diarrhea or loose stools.    . metoprolol tartrate (LOPRESSOR) 25 MG tablet Take 1 tablet (25 mg total) by mouth 2 (two) times daily. 180 tablet 3  . Multiple Vitamin (MULTIVITAMIN) tablet Take 1 tablet by mouth daily.    . nitroGLYCERIN (NITROSTAT) 0.4 MG SL tablet Place 1 tablet (0.4 mg total) under the tongue every 5 (five) minutes as needed for chest pain. 30 tablet 2  . pioglitazone (ACTOS) 15 MG tablet Take 15 mg by mouth daily.    . Potassium Chloride ER 20 MEQ TBCR Take 20 mEq by mouth 2 (two) times daily.     . promethazine (PHENERGAN) 25 MG suppository Place 1 suppository (25 mg total)  rectally every 8 (eight) hours as needed for nausea or vomiting. 6 each 0  . ranolazine (RANEXA) 500 MG 12 hr tablet Take 1 tablet (500 mg total) by mouth 2 (two) times daily. 60 tablet 0   No current facility-administered medications on file prior to visit.    Radiology:   No results found.  Cardiac Studies:   CT Cardiac scoring 02/01/2020: 1. Calcium score of 868. This is between the 90th and 100 percentile for females between the ages of 28 and 31. This is consistent with extensive atherosclerotic plaque. There is high likelihood of at least one significant coronary artery narrowing.  Lexiscan Tetrofosmin stress test 02/09/2020: Stress EKG is non-diagnostic, as this is pharmacological stress test. Rest and stress EKG show sinus rhythm with ventricular bigeminy/trigeminy. Small size, mild intensity, reversible perfusion defect in basal anteroseptal myocardium. Stress LVEF 49%. Intermediate risk stress test..  I had reviewed the angiograms after prior failed attempt and set her up for angiography  Echocardiogram 02/10/2020: Left ventricle cavity is normal in size. Moderate concentric hypertrophy of the left ventricle. Normal global wall motion. Normal LV systolic function with visual EF 55-60%. Diastolic function not assessed due to ventricular bigeminy.  Left atrial cavity is mildly dilated. Mild to moderate mitral regurgitation. IVC is dilated with respiratory variation. Estimated RA pressure 8 mmHg.  Coronary angiogram 02/15/2020: LM: Short, normal. LAD: Small to medium caliber vessel with prox-mid, eccentric 60-70% stenosis with moderate calcification.  Does not reach the apex.          Occluded calcific Diag 1, likely originating at the site of LAD stenosis.         Under filled apical LAD         Left-to-left (LCx-LAD/Diag) and right-to-left (RCA-LAD/Diag) collaterals LCx: Large vessel. Prox 30% stenosis. RCA: Ostial, eccentric 80% stenosis with moderate calcification.   LVEDP  normal.  Coronary angioplasty 04/18/2020  Ost RCA to Prox RCA lesion is 80% stenosed.  SP CSI orbital atherectomy with [place] followed by  [place] 3.5X22.  Stenosis reduced to 0%.  Rec: Continue aspirin indefinitely and Plavix for 6 months, can be discharged home today with outpatient follow-up.  ABI performed at PCP office 06/15/2020: Right ABI 1.1, left ABI 1.06.  Triphasic waveforms bilaterally.  Normal study.   EKG    EKG 11/20/2020: Marked sinus bradycardia at rate of 50 bpm, leftward axis, incomplete right bundle branch block.  Poor R wave progression, cannot exclude anteroseptal infarct old.  No evidence of ischemia.  Compared to 04/26/2020, frequent PVCs in ventricular bigeminal pattern not present.   Assessment     ICD-10-CM   1. Coronary artery disease involving native coronary artery of native heart with other form of angina pectoris ([place])  I25.118 EKG 12-Lead  2. Mixed  hyperlipidemia  E78.2 ezetimibe-simvastatin (VYTORIN) 10-20 MG tablet    Lipid Panel With LDL/HDL Ratio    Lipid Panel With LDL/HDL Ratio  3. Right carotid bruit  R09.89 PCV CAROTID DUPLEX (BILATERAL)     Meds ordered this encounter  Medications  . ezetimibe-simvastatin (VYTORIN) 10-20 MG tablet    Sig: Take 1 tablet by mouth daily at 6 PM.    Dispense:  30 tablet    Refill:  2    Medications Discontinued During This Encounter  Medication Reason  . levothyroxine (SYNTHROID) 175 MCG tablet Discontinued by provider  . promethazine (PHENERGAN) 25 MG tablet Duplicate  . ranolazine (RANEXA) 500 MG 12 hr tablet Duplicate  Levothyroxine has been on hold by PCP due to reduced levels of TSH. Recommendations:   Jessica [name]  is a 69 y.o. Caucasian female patient with hypertension, hyperlipidemia, diabetes mellitus with peripheral neuropathy, chronic stable angina pectoris and dyspnea on exertion and coronary artery disease S/P stenting to the ostial RCA on 04/18/2020 with implantation of a 3.5  x 22 mm resolute.  Has diffuse disease of 60% in the mid LAD which can be treated medically and it is small.    Her physical activity is limited due to spinal disease.  She also has a 40+ pack year history of smoking cigarettes. On her last office visit I discontinued atorvastatin due to severe myalgias, she has had complete resolution of severe pain in her legs and also her arms. I will start her on Vytorin 10/20 mg in the evening, will obtain lipids in 6 weeks and have like to see her back in 8 weeks.  With regard to coronary artery disease, she is presently on dual antiplatelet therapy. No changes in the EKG. Continue the same for now, I will consider discontinuing Plavix on her next office visit.  I hear a faint right carotid bruit. Will obtain carotid artery duplex.  Blood pressure is well controlled.  I will see her back in 3 months for close monitoring.   Adrian Prows, MD, [facility] 11/20/2020, 10:31 AM Office: [phone]

## 2020-11-20 NOTE — Patient Instructions (Signed)
Please get labs End of Jan 2022 at [facility]

## 2020-11-30 ENCOUNTER — Telehealth: Payer: Self-pay

## 2020-11-30 NOTE — Telephone Encounter (Signed)
Pt cannot afford the vytorin

## 2020-12-02 NOTE — Telephone Encounter (Signed)
Simva and Azetemibe, separately

## 2020-12-04 ENCOUNTER — Telehealth: Payer: Self-pay

## 2020-12-04 ENCOUNTER — Other Ambulatory Visit: Payer: Self-pay

## 2020-12-04 MED ORDER — EZETIMIBE 10 MG PO TABS: 10.0000 mg | ORAL_TABLET | Freq: Every day | ORAL | 3 refills | Status: DC

## 2020-12-04 MED ORDER — SIMVASTATIN 40 MG PO TABS: 40.0000 mg | ORAL_TABLET | Freq: Every day | ORAL | 3 refills | Status: DC

## 2020-12-04 NOTE — Telephone Encounter (Signed)
Pharmacist   Patient was prescribed Simvastatin 40mg , but patient is currently on Ranexa. Can cause rhabdomylosis if more than the guideline dose of 20mg .   [phone]

## 2020-12-04 NOTE — Telephone Encounter (Signed)
From patient.

## 2020-12-05 ENCOUNTER — Other Ambulatory Visit: Payer: Medicare Other

## 2020-12-05 ENCOUNTER — Telehealth: Payer: Self-pay

## 2020-12-05 NOTE — Telephone Encounter (Signed)
Sure change it to 1/2 tablet

## 2020-12-05 NOTE — Telephone Encounter (Signed)
Pharmacist   Patient was prescribed Simvastatin 40mg , but patient is currently on Ranexa. Can cause rhabdomylosis if more than the guideline dose of 20mg .   MA's :  Call pharmacy @ : [phone]

## 2020-12-06 NOTE — Telephone Encounter (Signed)
Pharmacy has made changes.

## 2020-12-14 DIAGNOSIS — E039 Hypothyroidism, unspecified: Secondary | ICD-10-CM | POA: Diagnosis not present

## 2020-12-14 DIAGNOSIS — E118 Type 2 diabetes mellitus with unspecified complications: Secondary | ICD-10-CM | POA: Diagnosis not present

## 2020-12-25 ENCOUNTER — Other Ambulatory Visit: Payer: Self-pay

## 2020-12-25 DIAGNOSIS — I25118 Atherosclerotic heart disease of native coronary artery with other forms of angina pectoris: Secondary | ICD-10-CM

## 2020-12-25 DIAGNOSIS — Z95820 Peripheral vascular angioplasty status with implants and grafts: Secondary | ICD-10-CM

## 2020-12-26 MED ORDER — CLOPIDOGREL BISULFATE 75 MG PO TABS: 75.0000 mg | ORAL_TABLET | Freq: Every day | ORAL | 0 refills | Status: DC

## 2021-01-04 DIAGNOSIS — E782 Mixed hyperlipidemia: Secondary | ICD-10-CM | POA: Diagnosis not present

## 2021-01-05 LAB — LIPID PANEL WITH LDL/HDL RATIO
Cholesterol, Total: 260 mg/dL — ABNORMAL HIGH (ref 100–199)
HDL: 60 mg/dL (ref >39)
LDL Chol Calc (NIH): 147 mg/dL — ABNORMAL HIGH (ref 0–99)
LDL/HDL Ratio: 2.5 ratio (ref 0.0–3.2)
Triglycerides: 289 mg/dL — ABNORMAL HIGH (ref 0–149)
VLDL Cholesterol Cal: 53 mg/dL — ABNORMAL HIGH (ref 5–40)

## 2021-01-15 ENCOUNTER — Encounter: Payer: Self-pay | Admitting: Cardiology

## 2021-01-15 ENCOUNTER — Ambulatory Visit: Payer: Medicare Other | Admitting: Cardiology

## 2021-01-15 ENCOUNTER — Other Ambulatory Visit: Payer: Self-pay

## 2021-01-15 VITALS — BP 135/80 | HR 64 | Temp 97.5°F | Resp 17 | Ht 66.0 in | Wt 191.2 lb

## 2021-01-15 DIAGNOSIS — I25118 Atherosclerotic heart disease of native coronary artery with other forms of angina pectoris: Secondary | ICD-10-CM

## 2021-01-15 DIAGNOSIS — I10 Essential (primary) hypertension: Secondary | ICD-10-CM | POA: Diagnosis not present

## 2021-01-15 DIAGNOSIS — R0609 Other forms of dyspnea: Secondary | ICD-10-CM | POA: Diagnosis not present

## 2021-01-15 DIAGNOSIS — E782 Mixed hyperlipidemia: Secondary | ICD-10-CM

## 2021-01-15 DIAGNOSIS — R0600 Dyspnea, unspecified: Secondary | ICD-10-CM

## 2021-01-15 MED ORDER — LOSARTAN POTASSIUM 25 MG PO TABS: 25.0000 mg | ORAL_TABLET | Freq: Every evening | ORAL | 2 refills | Status: DC

## 2021-01-15 NOTE — Progress Notes (Signed)
Primary Physician/Referring:  [name], DO  Patient ID: Jessica [name], female    DOB: September 30, 1951, 70 y.o.   MRN: [id number]  Chief Complaint  Patient presents with  . Hyperlipidemia  . Coronary Artery Disease  . Follow-up    8 weeks   HPI:    Jessica [name]  is a 70 y.o. Caucasian female patient with hypertension, hyperlipidemia, diabetes mellitus with peripheral neuropathy, chronic stable angina pectoris and dyspnea on exertion and coronary artery disease s/p she called same stenting to RCA, moderate disease in mid LAD being treated medically Jessica [name] also. Her physical activity is limited due to spinal disease.  She also has a 40+ pack year history of smoking cigarettes.   On her last office visit I discontinued atorvastatin due to severe myalgias, she has had complete resolution of severe pain in her legs and also her arms.  I had started her on simvastatin and Zetia, she just started taking simvastatin about a week ago.  She has not started Zetia yet due to the fear of severe myalgias.  She has not had any angina pectoris, presents for a 42-monthoffice visit follow-up.    Past Medical History:  Diagnosis Date  . Allergy   . Anxiety   . Asthma   . Cervical stenosis of spine   . COPD (chronic obstructive pulmonary disease) ([place]   . DDD (degenerative disc disease)   . Depression   . Diabetes mellitus without complication ([place]   . Fatty liver   . Gastric ulcer   . GERD (gastroesophageal reflux disease)   . Heart murmur   . Hyperglycemia   . Hyperlipidemia   . Hypothyroidism   . Irregular heartbeat   . Migraines   . Multiple gastric ulcers   . Obesity   . Osteoarthritis   . Rheumatoid arthritis ([place]   . Sciatica   . Scoliosis   . Vitamin D deficiency    Past Surgical History:  Procedure Laterality Date  . ANTERIOR CERVICAL DECOMP/DISCECTOMY FUSION  12/08/2017   Procedure: Anterior Cervical Decompression/Discectomy Fusion - Cervical four-Cervical five - Cervical  five-Cervical six - Cervical six-Cervical seven;  Surgeon: PEarnie Larsson MD;  [facility]  Service: Neurosurgery;;  . APPENDECTOMY    . CHOLECYSTECTOMY  2010  . COLONOSCOPY    . COLOSTOMY     removed 3 inches of colon 1987  . 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 CORONARY ATHERECTOMY N/A 04/18/2020   Procedure: CORONARY ATHERECTOMY;  Surgeon: GAdrian Prows MD;  [facility];  Service: Cardiovascular;  Laterality: N/A;  Ostial RCA  . CORONARY BALLOON ANGIOPLASTY N/A 02/29/2020   Procedure: CORONARY BALLOON ANGIOPLASTY;  Surgeon: PNigel Mormon MD;  [facility];  Service: Cardiovascular;  Laterality: N/A;  . CORONARY STENT INTERVENTION N/A 04/18/2020   Procedure: CORONARY STENT INTERVENTION;  Surgeon: GAdrian Prows MD;  [facility];  Service: Cardiovascular;  Laterality: N/A;  Ostial RCA  . HAND SURGERY Right 2006  . INTRAVASCULAR ULTRASOUND/IVUS N/A 02/29/2020   Procedure: Intravascular Ultrasound/IVUS;  Surgeon: PNigel Mormon MD;  [facility];  Service: Cardiovascular;  Laterality: N/A;  . LEFT HEART CATH N/A 02/29/2020   Procedure: Left Heart Cath;  Surgeon: PNigel Mormon MD;  [facility];  Service: Cardiovascular;  Laterality: N/A;  . LEFT HEART CATH AND CORONARY ANGIOGRAPHY N/A 02/15/2020   Procedure: LEFT  HEART CATH AND CORONARY ANGIOGRAPHY;  Surgeon: Nigel Mormon, MD;  [facility];  Service: Cardiovascular;  Laterality: N/A;  . Perforated Colon  1986   by Dr. Margot Chimes  . POLYPECTOMY    . TOTAL ABDOMINAL HYSTERECTOMY  1989   Dr. Nori Riis  . UPPER GASTROINTESTINAL ENDOSCOPY    . UPPER GI ENDOSCOPY     Family History  Problem Relation Age of Onset  . Heart attack Father   . Heart disease Mother   . Hypertension Mother   . Stroke Mother   . Breast cancer Sister   . Diabetes Brother   . Stroke Brother   . Colon  cancer Neg Hx   . Esophageal cancer Neg Hx   . Liver cancer Neg Hx   . Pancreatic cancer Neg Hx   . Rectal cancer Neg Hx   . Stomach cancer Neg Hx     Social History   Tobacco Use  . Smoking status: Former Smoker    Packs/day: 1.00    Years: 40.00    Pack years: 40.00    Types: Cigarettes    Quit date: 12/08/2017    Years since quitting: 3.1  . Smokeless tobacco: Never Used  Substance Use Topics  . Alcohol use: No   Marital Status: Married  ROS  Review of Systems  Constitutional: Positive for malaise/fatigue.  Cardiovascular: Negative for chest pain, dyspnea on exertion and leg swelling.  Musculoskeletal: Positive for arthritis, back pain, joint pain and neck pain. Negative for muscle cramps and muscle weakness.  Gastrointestinal: Negative for melena.   Objective  Blood pressure 135/80, pulse 64, temperature (!) 97.5 F (36.4 C), temperature source Temporal, resp. rate 17, height 5' 6"  (1.676 m), weight 191 lb 3.2 oz (86.7 kg), SpO2 97 %.  Vitals with BMI 01/15/2021 11/20/2020 08/17/2020  Height 5' 6"  5' 6"  5' 6"   Weight 191 lbs 3 oz 188 lbs 176 lbs  BMI 30.88 62.13 08.65  Systolic 784 696 295  Diastolic 80 70 61  Pulse 64 51 56     Physical Exam Constitutional:      Comments: Appears older than stated age.   Cardiovascular:     Rate and Rhythm: Normal rate and regular rhythm.     Pulses:          Carotid pulses are 2+ on the right side and 2+ on the left side.      Popliteal pulses are 2+ on the right side and 2+ on the left side.       Dorsalis pedis pulses are 2+ on the right side and 1+ on the left side.       Posterior tibial pulses are 2+ on the right side and 2+ on the left side.     Heart sounds: No murmur heard. No gallop.      Comments: No JVD. No pedal edema. Pulmonary:     Effort: Pulmonary effort is normal. No accessory muscle usage or respiratory distress.     Breath sounds: Normal breath sounds.  Abdominal:     General: Bowel sounds are normal.      Palpations: Abdomen is soft.    Laboratory examination:   Recent Labs    02/15/20 0606 02/29/20 1041 03/01/20 0223 04/13/20 1305  NA 141 138 138 142  K 3.7 5.0 3.8 4.2  CL 105 104 108 103  CO2 23  --  21* 23  GLUCOSE 102* 147* 120* 139*  BUN 13 8 7* 7*  CREATININE 0.70 0.50 0.55 0.51*  CALCIUM 8.8*  --  7.7* 9.0  GFRNONAA >60  --  >60 101  GFRAA >60  --  >60 116   CrCl cannot be calculated (Patient's most recent lab result is older than the maximum 21 days allowed.).  CMP Latest Ref Rng & Units 04/13/2020 03/01/2020 02/29/2020  Glucose 65 - 99 mg/dL 139(H) 120(H) 147(H)  BUN 8 - 27 mg/dL 7(L) 7(L) 8  Creatinine 0.57 - 1.00 mg/dL 0.51(L) 0.55 0.50  Sodium 134 - 144 mmol/L 142 138 138  Potassium 3.5 - 5.2 mmol/L 4.2 3.8 5.0  Chloride 96 - 106 mmol/L 103 108 104  CO2 20 - 29 mmol/L 23 21(L) -  Calcium 8.7 - 10.3 mg/dL 9.0 7.7(L) -  Total Protein 6.5 - 8.1 g/dL - - -  Total Bilirubin 0.3 - 1.2 mg/dL - - -  Alkaline Phos 38 - 126 U/L - - -  AST 15 - 41 U/L - - -  ALT 0 - 44 U/L - - -   CBC Latest Ref Rng & Units 04/13/2020 03/01/2020 02/29/2020  WBC 3.4 - 10.8 x10E3/uL 6.5 11.3(H) -  Hemoglobin 11.1 - 15.9 g/dL 14.0 11.2(L) 12.2  Hematocrit 34.0 - 46.6 % 40.0 32.8(L) 36.0  Platelets 150 - 400 K/uL - 250 -   Lipid Panel Recent Labs    01/04/21 1015  CHOL 260*  TRIG 289*  LDLCALC 147*  HDL 60   External labs:     Cholesterol, total 359.000 M 02/15/2019 HDL 62.000 06/02/2020 LDL-C 48.000 06/02/2020 Triglycerides 136.000 06/02/2020  A1C 5.500 02/29/2020 TSH 0.010 06/02/2020  Hemoglobin 14.000 G/ 04/13/2020  Creatinine, Serum 0.510 04/13/2020 Potassium 4.200 04/13/2020 ALT (SGPT) 22.000 09/25/2019  01/24/2020: Glucose 87, BUN/Cr 19/0.87. EGFR >=60. Na/K 139/5.0. Rest of the CMP normal H/H 15.1/43.4. MCV 94.8. Platelets 227  Cholesterol, total 359.000 M 02/15/2019 Triglycerides 352.000 09/13/2019 HDL 53.000 M 02/15/2019 LDL 140.000 09/24/2017  Medications and  allergies   Allergies  Allergen Reactions  . Atorvastatin Other (See Comments)    Severe myalgia  . Metformin Hcl Diarrhea  . Adhesive [Tape] Itching, Rash and Other (See Comments)    Paper tape okay  . Latex Rash  . Oxycodone     Loopy, felt sick  . Remicade [Infliximab] Itching    Current Outpatient Medications on File Prior to Visit  Medication Sig Dispense Refill  . acetaminophen (TYLENOL) 500 MG tablet Take 500 mg by mouth every 6 (six) hours as needed for moderate pain or headache.    . ALPRAZolam (XANAX) 0.5 MG tablet Take 0.5 mg by mouth 3 (three) times daily.    Marland Kitchen aspirin EC 81 MG tablet Take 1 tablet (81 mg total) by mouth daily. 90 tablet 3  . butalbital-acetaminophen-caffeine (FIORICET, ESGIC) 50-325-40 MG per tablet Take 1 tablet by mouth 3 (three) times daily as needed for headache or migraine.     . cholestyramine (QUESTRAN) 4 g packet Take 4 g by mouth in the morning and at bedtime.    . clopidogrel (PLAVIX) 75 MG tablet Take 1 tablet (75 mg total) by mouth daily. 90 tablet 0  . dicyclomine (BENTYL) 20 MG tablet Take 20 mg by mouth 2 (two) times daily.    . ergocalciferol (VITAMIN D2) 50000 UNITS capsule Take 50,000 Units by mouth every Thursday.     . escitalopram (LEXAPRO) 20 MG tablet Take 20 mg by mouth daily.    Marland Kitchen estradiol (ESTRACE) 2 MG tablet Take 2 mg by mouth  daily.    . EUTHYROX 75 MCG tablet Take 75 mcg by mouth daily.    Marland Kitchen ezetimibe (ZETIA) 10 MG tablet Take 1 tablet (10 mg total) by mouth daily. (Patient taking differently: Take 10 mg by mouth every evening.) 90 tablet 3  . gabapentin (NEURONTIN) 300 MG capsule Take 300 mg by mouth 2 (two) times daily.    Marland Kitchen glimepiride (AMARYL) 4 MG tablet Take 4 mg by mouth in the morning and at bedtime.     Marland Kitchen HYDROcodone-acetaminophen (NORCO/VICODIN) 5-325 MG tablet Take 1 tablet by mouth at bedtime.    Marland Kitchen loperamide (IMODIUM A-D) 2 MG tablet Take 4 mg by mouth 3 (three) times daily as needed for diarrhea or loose stools.     . metoprolol tartrate (LOPRESSOR) 25 MG tablet Take 1 tablet (25 mg total) by mouth 2 (two) times daily. 180 tablet 3  . Multiple Vitamin (MULTIVITAMIN) tablet Take 1 tablet by mouth daily.    . nitroGLYCERIN (NITROSTAT) 0.4 MG SL tablet Place 1 tablet (0.4 mg total) under the tongue every 5 (five) minutes as needed for chest pain. 30 tablet 2  . omeprazole (PRILOSEC) 20 MG capsule Take 20 mg by mouth daily.    . pioglitazone (ACTOS) 15 MG tablet Take 15 mg by mouth daily.    . Potassium Chloride ER 20 MEQ TBCR Take 20 mEq by mouth 2 (two) times daily.     . promethazine (PHENERGAN) 25 MG suppository Place 1 suppository (25 mg total) rectally every 8 (eight) hours as needed for nausea or vomiting. 6 each 0  . ranolazine (RANEXA) 500 MG 12 hr tablet Take 1 tablet (500 mg total) by mouth 2 (two) times daily. 60 tablet 0  . simvastatin (ZOCOR) 40 MG tablet Take 1 tablet (40 mg total) by mouth daily. (Patient taking differently: Take 40 mg by mouth every evening.) 90 tablet 3   No current facility-administered medications on file prior to visit.    Radiology:   No results found.  Cardiac Studies:   CT Cardiac scoring 02/01/2020: 1. Calcium score of 868. This is between the 90th and 100 percentile for females between the ages of 61 and 2. This is consistent with extensive atherosclerotic plaque. There is high likelihood of at least one significant coronary artery narrowing.  Lexiscan Tetrofosmin stress test 02/09/2020: Stress EKG is non-diagnostic, as this is pharmacological stress test. Rest and stress EKG show sinus rhythm with ventricular bigeminy/trigeminy. Small size, mild intensity, reversible perfusion defect in basal anteroseptal myocardium. Stress LVEF 49%. Intermediate risk stress test..  I had reviewed the angiograms after prior failed attempt and set her up for angiography  Echocardiogram 02/10/2020: Left ventricle cavity is normal in size. Moderate concentric hypertrophy of the  left ventricle. Normal global wall motion. Normal LV systolic function with visual EF 55-60%. Diastolic function not assessed due to ventricular bigeminy.  Left atrial cavity is mildly dilated. Mild to moderate mitral regurgitation. IVC is dilated with respiratory variation. Estimated RA pressure 8 mmHg.  Coronary angiogram 02/15/2020: LM: Short, normal. LAD: Small to medium caliber vessel with prox-mid, eccentric 60-70% stenosis with moderate calcification.  Does not reach the apex.          Occluded calcific Diag 1, likely originating at the site of LAD stenosis.         Under filled apical LAD         Left-to-left (LCx-LAD/Diag) and right-to-left (RCA-LAD/Diag) collaterals LCx: Large vessel. Prox 30% stenosis. RCA: Ostial, eccentric 80% stenosis  with moderate calcification.   LVEDP normal.  Coronary angioplasty 04/18/2020  Ost RCA to Prox RCA lesion is 80% stenosed.  SP CSI orbital atherectomy with Diamond[place] followed by  G[place]y 3.5X22.  Stenosis reduced to 0%.  ABI performed at PCP office 06/15/2020: Right ABI 1.1, left ABI 1.06.  Triphasic waveforms bilaterally.  Normal study.   EKG    EKG 11/20/2020: Marked sinus bradycardia at rate of 50 bpm, leftward axis, incomplete right bundle branch block.  Poor R wave progression, cannot exclude anteroseptal infarct old.  No evidence of ischemia.  Compared to 04/26/2020, frequent PVCs in ventricular bigeminal pattern not present.   Assessment     ICD-10-CM   1. Coronary artery disease involving native coronary artery of native heart with other form of angina pectoris ([place])  I25.118   2. Mixed hyperlipidemia  E78.2 Lipid Panel With LDL/HDL Ratio    Lipid Panel With LDL/HDL Ratio  3. Primary hypertension  I10 losartan (COZAAR) 25 MG tablet  4. Exertional dyspnea  R06.00      Meds ordered this encounter  Medications  . losartan (COZAAR) 25 MG tablet    Sig: Take 1 tablet (25 mg total) by mouth every evening.     Dispense:  30 tablet    Refill:  2    Medications Discontinued During This Encounter  Medication Reason  . amLODipine (NORVASC) 5 MG tablet Discontinued by provider   Orders Placed This Encounter  Procedures  . Lipid Panel With LDL/HDL Ratio    Standing Status:   Future    Number of Occurrences:   1    Standing Expiration Date:   01/15/2022     Recommendations:   Jessica [name]  is a 70 y.o. Caucasian female patient with hypertension, hyperlipidemia, diabetes mellitus with peripheral neuropathy, chronic stable angina pectoris and dyspnea on exertion and coronary artery disease S/P stenting to the ostial RCA on 04/18/2020 with implantation of a 3.5 x 22 mm resolute.  Has diffuse disease of 60% in the mid LAD which can be treated medically and it is small.    Her physical activity is limited due to spinal disease.  She also has a 40+ pack year history of smoking cigarettes. On her last office visit I discontinued atorvastatin due to severe myalgias, and I had prescribed her simvastatin and Zetia combination which she has not started due to fear.  She underwent labs, obviously lipids are not well controlled.  I reassured her and she is now willing to start taking simvastatin and Zetia and I will repeat lipids in 6 weeks and would like to see her back in 2 months for close monitoring.  As she is on simvastatin, will discontinue amlodipine, as she is also diabetic, I will start her on low-dose losartan 25 mg daily.  On her next office visit if she remains stable, I will discontinue Plavix and continue aspirin indefinitely.  Due to faint right carotid bruit, carotid duplex has been ordered and I will follow up on this.  Blood pressure is well controlled.    Adrian Prows, MD, [facility] 01/15/2021, 4:38 PM Office: [phone]

## 2021-01-22 DIAGNOSIS — M0609 Rheumatoid arthritis without rheumatoid factor, multiple sites: Secondary | ICD-10-CM | POA: Diagnosis not present

## 2021-01-22 DIAGNOSIS — Z79899 Other long term (current) drug therapy: Secondary | ICD-10-CM | POA: Diagnosis not present

## 2021-01-22 DIAGNOSIS — Z955 Presence of coronary angioplasty implant and graft: Secondary | ICD-10-CM | POA: Diagnosis not present

## 2021-01-22 DIAGNOSIS — E039 Hypothyroidism, unspecified: Secondary | ICD-10-CM | POA: Diagnosis not present

## 2021-01-22 DIAGNOSIS — E1159 Type 2 diabetes mellitus with other circulatory complications: Secondary | ICD-10-CM | POA: Diagnosis not present

## 2021-01-22 DIAGNOSIS — I2510 Atherosclerotic heart disease of native coronary artery without angina pectoris: Secondary | ICD-10-CM | POA: Diagnosis not present

## 2021-01-30 ENCOUNTER — Other Ambulatory Visit: Payer: Self-pay

## 2021-01-30 ENCOUNTER — Ambulatory Visit: Payer: Medicare Other

## 2021-01-30 DIAGNOSIS — R0989 Other specified symptoms and signs involving the circulatory and respiratory systems: Secondary | ICD-10-CM | POA: Diagnosis not present

## 2021-02-07 NOTE — Progress Notes (Signed)
No significant disease in the internal carotid artery.  External carotid artery stenosis is not clinically significant.

## 2021-02-20 ENCOUNTER — Other Ambulatory Visit: Payer: Self-pay

## 2021-02-20 DIAGNOSIS — I10 Essential (primary) hypertension: Secondary | ICD-10-CM

## 2021-02-20 MED ORDER — LOSARTAN POTASSIUM 25 MG PO TABS: 25.0000 mg | ORAL_TABLET | Freq: Every evening | ORAL | 3 refills | Status: DC

## 2021-02-26 DIAGNOSIS — E782 Mixed hyperlipidemia: Secondary | ICD-10-CM | POA: Diagnosis not present

## 2021-02-27 DIAGNOSIS — E039 Hypothyroidism, unspecified: Secondary | ICD-10-CM | POA: Diagnosis not present

## 2021-02-27 LAB — LIPID PANEL WITH LDL/HDL RATIO
Cholesterol, Total: 194 mg/dL (ref 100–199)
HDL: 56 mg/dL (ref >39)
LDL Chol Calc (NIH): 95 mg/dL (ref 0–99)
LDL/HDL Ratio: 1.7 ratio (ref 0.0–3.2)
Triglycerides: 258 mg/dL — ABNORMAL HIGH (ref 0–149)
VLDL Cholesterol Cal: 43 mg/dL — ABNORMAL HIGH (ref 5–40)

## 2021-03-26 ENCOUNTER — Other Ambulatory Visit: Payer: Self-pay

## 2021-03-26 ENCOUNTER — Ambulatory Visit: Payer: Medicare Other | Admitting: Cardiology

## 2021-03-26 ENCOUNTER — Encounter: Payer: Self-pay | Admitting: Cardiology

## 2021-03-26 VITALS — BP 135/72 | HR 54 | Temp 98.6°F | Resp 16 | Ht 66.0 in | Wt 196.8 lb

## 2021-03-26 DIAGNOSIS — I25118 Atherosclerotic heart disease of native coronary artery with other forms of angina pectoris: Secondary | ICD-10-CM | POA: Diagnosis not present

## 2021-03-26 DIAGNOSIS — I10 Essential (primary) hypertension: Secondary | ICD-10-CM

## 2021-03-26 DIAGNOSIS — R0989 Other specified symptoms and signs involving the circulatory and respiratory systems: Secondary | ICD-10-CM | POA: Diagnosis not present

## 2021-03-26 DIAGNOSIS — E782 Mixed hyperlipidemia: Secondary | ICD-10-CM

## 2021-03-26 MED ORDER — EZETIMIBE 10 MG PO TABS
10.0000 mg | ORAL_TABLET | Freq: Every evening | ORAL | 3 refills | Status: DC
Start: 2021-03-26 — End: 2021-12-14

## 2021-03-26 MED ORDER — ASPIRIN 81 MG PO CHEW: 81.0000 mg | CHEWABLE_TABLET | Freq: Every day | ORAL | Status: AC

## 2021-03-26 MED ORDER — ROSUVASTATIN CALCIUM 20 MG PO TABS: 20.0000 mg | ORAL_TABLET | Freq: Every day | ORAL | 2 refills | Status: DC

## 2021-03-26 NOTE — Progress Notes (Signed)
Primary Physician/Referring:  [name], DO  Patient ID: Jessica [name], female    DOB: 07/21/1951, 70 y.o.   MRN: [id number]  Chief Complaint  Patient presents with  . Coronary Artery Disease  . Follow-up    2 months   HPI:    Jessica [name]  is a 70 y.o. Caucasian female patient with hypertension, hyperlipidemia, diabetes mellitus with peripheral neuropathy, chronic stable angina pectoris and dyspnea on exertion and coronary artery disease s/p Jessica [name] called same stenting to RCA, moderate disease in mid LAD being treated medically [name] also. Jessica [name] physical activity is limited due to spinal disease.  Jessica [name] also has a 40+ pack year history of smoking cigarettes.   As Jessica [name] could not tolerate atorvastatin, I started Jessica [name] on simvastatin and Zetia combination which he is tolerating.  Jessica [name] underwent labs and presents for follow-up. Jessica [name] has not had any angina pectoris, presents for a 61-monthoffice visit follow-up.    Past Medical History:  Diagnosis Date  . Allergy   . Anxiety   . Asthma   . Cervical stenosis of spine   . COPD (chronic obstructive pulmonary disease) ([place]   . DDD (degenerative disc disease)   . Depression   . Diabetes mellitus without complication ([place]   . Fatty liver   . Gastric ulcer   . GERD (gastroesophageal reflux disease)   . Heart murmur   . Hyperglycemia   . Hyperlipidemia   . Hypothyroidism   . Irregular heartbeat   . Migraines   . Multiple gastric ulcers   . Obesity   . Osteoarthritis   . Rheumatoid arthritis ([place]   . Sciatica   . Scoliosis   . Vitamin D deficiency    Past Surgical History:  Procedure Laterality Date  . ANTERIOR CERVICAL DECOMP/DISCECTOMY FUSION  12/08/2017   Procedure: Anterior Cervical Decompression/Discectomy Fusion - Cervical four-Cervical five - Cervical five-Cervical six - Cervical six-Cervical seven;  Surgeon: PEarnie Larsson MD;  [place]  Service: Neurosurgery;;  . APPENDECTOMY    . CHOLECYSTECTOMY  2010  . COLONOSCOPY     . COLOSTOMY     removed 3 inches of colon 1987  . 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 CORONARY ATHERECTOMY N/A 04/18/2020   Procedure: CORONARY ATHERECTOMY;  Surgeon: GAdrian Prows MD;  [place] [facility];  Service: Cardiovascular;  Laterality: N/A;  Ostial RCA  . CORONARY BALLOON ANGIOPLASTY N/A 02/29/2020   Procedure: CORONARY BALLOON ANGIOPLASTY;  Surgeon: PNigel Mormon MD;  [facility];  Service: Cardiovascular;  Laterality: N/A;  . CORONARY STENT INTERVENTION N/A 04/18/2020   Procedure: CORONARY STENT INTERVENTION;  Surgeon: GAdrian Prows MD;  [facility] [facility];  Service: Cardiovascular;  Laterality: N/A;  Ostial RCA  . HAND SURGERY Right 2006  . INTRAVASCULAR ULTRASOUND/IVUS N/A 02/29/2020   Procedure: Intravascular Ultrasound/IVUS;  Surgeon: PNigel Mormon MD;  [place] [facility];  Service: Cardiovascular;  Laterality: N/A;  . LEFT HEART CATH N/A 02/29/2020   Procedure: Left Heart Cath;  Surgeon: PNigel Mormon MD;  [place] [facility];  Service: Cardiovascular;  Laterality: N/A;  . LEFT HEART CATH AND CORONARY ANGIOGRAPHY N/A 02/15/2020   Procedure: LEFT HEART CATH AND CORONARY ANGIOGRAPHY;  Surgeon: PNigel Mormon MD;  [place] [facility];  Service: Cardiovascular;  Laterality: N/A;  . Perforated Colon  1986   by Dr. SMargot Chimes . POLYPECTOMY    .  TOTAL ABDOMINAL HYSTERECTOMY  1989   Dr. Nori Riis  . UPPER GASTROINTESTINAL ENDOSCOPY    . UPPER GI ENDOSCOPY     Family History  Problem Relation Age of Onset  . Heart attack Father   . Heart disease Mother   . Hypertension Mother   . Stroke Mother   . Breast cancer Sister   . Diabetes Brother   . Stroke Brother   . Colon cancer Neg Hx   . Esophageal cancer Neg Hx   . Liver cancer Neg Hx   . Pancreatic cancer Neg Hx   . Rectal cancer Neg Hx   . Stomach cancer Neg Hx     Social History   Tobacco  Use  . Smoking status: Former Smoker    Packs/day: 1.00    Years: 40.00    Pack years: 40.00    Types: Cigarettes    Quit date: 12/08/2017    Years since quitting: 3.2  . Smokeless tobacco: Never Used  Substance Use Topics  . Alcohol use: No   Marital Status: Married  ROS  Review of Systems  Constitutional: Positive for malaise/fatigue.  Cardiovascular: Negative for chest pain, dyspnea on exertion and leg swelling.  Musculoskeletal: Positive for arthritis, back pain, joint pain and neck pain. Negative for muscle cramps and muscle weakness.  Gastrointestinal: Negative for melena.   Objective  Blood pressure 135/72, pulse (!) 54, temperature 98.6 F (37 C), temperature source Temporal, resp. rate 16, height 5' 6" (1.676 m), weight 196 lb 12.8 oz (89.3 kg), SpO2 96 %.  Vitals with BMI 03/26/2021 01/15/2021 11/20/2020  Height 5' 6" 5' 6" 5' 6"  Weight 196 lbs 13 oz 191 lbs 3 oz 188 lbs  BMI 31.78 09.32 35.57  Systolic 322 025 427  Diastolic 72 80 70  Pulse 54 64 51     Physical Exam Constitutional:      Comments: Appears older than stated age.   Cardiovascular:     Rate and Rhythm: Normal rate and regular rhythm.     Pulses:          Carotid pulses are 2+ on the right side and 2+ on the left side.      Popliteal pulses are 2+ on the right side and 2+ on the left side.       Dorsalis pedis pulses are 2+ on the right side and 1+ on the left side.       Posterior tibial pulses are 2+ on the right side and 2+ on the left side.     Heart sounds: No murmur heard. No gallop.      Comments: No JVD. No pedal edema. Pulmonary:     Effort: Pulmonary effort is normal. No accessory muscle usage or respiratory distress.     Breath sounds: Normal breath sounds.  Abdominal:     General: Bowel sounds are normal.     Palpations: Abdomen is soft.    Laboratory examination:   Recent Labs    04/13/20 1305  NA 142  K 4.2  CL 103  CO2 23  GLUCOSE 139*  BUN 7*  CREATININE 0.51*   CALCIUM 9.0  GFRNONAA 101  GFRAA 116   CrCl cannot be calculated (Patient's most recent lab result is older than the maximum 21 days allowed.).  CMP Latest Ref Rng & Units 04/13/2020 03/01/2020 02/29/2020  Glucose 65 - 99 mg/dL 139(H) 120(H) 147(H)  BUN 8 - 27 mg/dL 7(L) 7(L) 8  Creatinine 0.57 - 1.00 mg/dL  0.51(L) 0.55 0.50  Sodium 134 - 144 mmol/L 142 138 138  Potassium 3.5 - 5.2 mmol/L 4.2 3.8 5.0  Chloride 96 - 106 mmol/L 103 108 104  CO2 20 - 29 mmol/L 23 21(L) -  Calcium 8.7 - 10.3 mg/dL 9.0 7.7(L) -  Total Protein 6.5 - 8.1 g/dL - - -  Total Bilirubin 0.3 - 1.2 mg/dL - - -  Alkaline Phos 38 - 126 U/L - - -  AST 15 - 41 U/L - - -  ALT 0 - 44 U/L - - -   CBC Latest Ref Rng & Units 04/13/2020 03/01/2020 02/29/2020  WBC 3.4 - 10.8 x10E3/uL 6.5 11.3(H) -  Hemoglobin 11.1 - 15.9 g/dL 14.0 11.2(L) 12.2  Hematocrit 34.0 - 46.6 % 40.0 32.8(L) 36.0  Platelets 150 - 400 K/uL - 250 -   Lipid Panel Recent Labs    01/04/21 1015 02/26/21 1019  CHOL 260* 194  TRIG 289* 258*  LDLCALC 147* 95  HDL 60 56   External labs:     Cholesterol, total 359.000 M 02/15/2019 HDL 62.000 06/02/2020 LDL-C 48.000 06/02/2020 Triglycerides 136.000 06/02/2020  A1C 5.500 02/29/2020 TSH 0.010 06/02/2020  Hemoglobin 14.000 G/ 04/13/2020  Creatinine, Serum 0.510 04/13/2020 Potassium 4.200 04/13/2020 ALT (SGPT) 22.000 09/25/2019  01/24/2020: Glucose 87, BUN/Cr 19/0.87. EGFR >=60. Na/K 139/5.0. Rest of the CMP normal H/H 15.1/43.4. MCV 94.8. Platelets 227  Cholesterol, total 359.000 M 02/15/2019 Triglycerides 352.000 09/13/2019 HDL 53.000 M 02/15/2019 LDL 140.000 09/24/2017  Medications and allergies   Allergies  Allergen Reactions  . Atorvastatin Other (See Comments)    Severe myalgia  . Metformin Hcl Diarrhea  . Adhesive [Tape] Itching, Rash and Other (See Comments)    Paper tape okay  . Latex Rash  . Oxycodone     Loopy, felt sick  . Remicade [Infliximab] Itching    Current Outpatient  Medications on File Prior to Visit  Medication Sig Dispense Refill  . acetaminophen (TYLENOL) 500 MG tablet Take 500 mg by mouth every 6 (six) hours as needed for moderate pain or headache.    . ALPRAZolam (XANAX) 0.5 MG tablet Take 0.5 mg by mouth 3 (three) times daily.    . butalbital-acetaminophen-caffeine (FIORICET, ESGIC) 50-325-40 MG per tablet Take 1 tablet by mouth 3 (three) times daily as needed for headache or migraine.     . cholestyramine (QUESTRAN) 4 g packet Take 4 g by mouth in the morning and at bedtime.    . dicyclomine (BENTYL) 20 MG tablet Take 20 mg by mouth 2 (two) times daily.    . ergocalciferol (VITAMIN D2) 50000 UNITS capsule Take 50,000 Units by mouth every Thursday.     . escitalopram (LEXAPRO) 20 MG tablet Take 20 mg by mouth daily.    . estradiol (ESTRACE) 2 MG tablet Take 2 mg by mouth daily.    . gabapentin (NEURONTIN) 300 MG capsule Take 300 mg by mouth 2 (two) times daily.    . glimepiride (AMARYL) 4 MG tablet Take 4 mg by mouth in the morning and at bedtime.     . HYDROcodone-acetaminophen (NORCO/VICODIN) 5-325 MG tablet Take 1 tablet by mouth at bedtime.    . levothyroxine (SYNTHROID) 150 MCG tablet Take 150 mcg by mouth daily.    . losartan (COZAAR) 25 MG tablet Take 1 tablet (25 mg total) by mouth every evening. 90 tablet 3  . metoprolol tartrate (LOPRESSOR) 25 MG tablet Take 1 tablet (25 mg total) by mouth 2 (two) times daily. 180   tablet 3  . Multiple Vitamin (MULTIVITAMIN) tablet Take 1 tablet by mouth daily.    . nitroGLYCERIN (NITROSTAT) 0.4 MG SL tablet Place 1 tablet (0.4 mg total) under the tongue every 5 (five) minutes as needed for chest pain. 30 tablet 2  . pioglitazone (ACTOS) 15 MG tablet Take 15 mg by mouth daily.    . Potassium Chloride ER 20 MEQ TBCR Take 20 mEq by mouth 2 (two) times daily.     . promethazine (PHENERGAN) 25 MG suppository Place 1 suppository (25 mg total) rectally every 8 (eight) hours as needed for nausea or vomiting. 6 each 0   . ranolazine (RANEXA) 500 MG 12 hr tablet Take 1 tablet (500 mg total) by mouth 2 (two) times daily. 60 tablet 0  . loperamide (IMODIUM A-D) 2 MG tablet Take 4 mg by mouth 3 (three) times daily as needed for diarrhea or loose stools.     No current facility-administered medications on file prior to visit.    Radiology:   No results found.  Cardiac Studies:   CT Cardiac scoring 02/01/2020: 1. Calcium score of 868. This is between the 90th and 100 percentile for females between the ages of 64 and 83. This is consistent with extensive atherosclerotic plaque. There is high likelihood of at least one significant coronary artery narrowing.  Lexiscan Tetrofosmin stress test 02/09/2020: Stress EKG is non-diagnostic, as this is pharmacological stress test. Rest and stress EKG show sinus rhythm with ventricular bigeminy/trigeminy. Small size, mild intensity, reversible perfusion defect in basal anteroseptal myocardium. Stress LVEF 49%. Intermediate risk stress test..  I had reviewed the angiograms after prior failed attempt and set Jessica [name] up for angiography  Echocardiogram 02/10/2020: Left ventricle cavity is normal in size. Moderate concentric hypertrophy of the left ventricle. Normal global wall motion. Normal LV systolic function with visual EF 55-60%. Diastolic function not assessed due to ventricular bigeminy.  Left atrial cavity is mildly dilated. Mild to moderate mitral regurgitation. IVC is dilated with respiratory variation. Estimated RA pressure 8 mmHg.  Coronary angiogram 02/15/2020: LM: Short, normal. LAD: Small to medium caliber vessel with prox-mid, eccentric 60-70% stenosis with moderate calcification.  Does not reach the apex.          Occluded calcific Diag 1, likely originating at the site of LAD stenosis.         Under filled apical LAD         Left-to-left (LCx-LAD/Diag) and right-to-left (RCA-LAD/Diag) collaterals LCx: Large vessel. Prox 30% stenosis. RCA: Ostial, eccentric 80%  stenosis with moderate calcification.   LVEDP normal.  Coronary angioplasty 04/18/2020  Ost RCA to Prox RCA lesion is 80% stenosed.  SP CSI orbital atherectomy with [place] followed by  [place] 3.5X22.  Stenosis reduced to 0%.  ABI performed at PCP office 06/15/2020: Right ABI 1.1, left ABI 1.06.  Triphasic waveforms bilaterally.  Normal study.  Carotid artery duplex 01/30/2021: Mild atherosclerotic plaque seen in the right ICA. Stenosis in the right external carotid artery (<50%). Mild atherosclerotic plaque seen in the left ICA. There is heterogeneous plaque (mild) in bilateral carotid arteries. Antegrade right vertebral artery flow. Antegrade left vertebral artery flow. Right external carotid stenosis probably sources of bruit. Follow up is appropriate when clinically indicated.   EKG    EKG 11/20/2020: Marked sinus bradycardia at rate of 50 bpm, leftward axis, incomplete right bundle branch block.  Poor R wave progression, cannot exclude anteroseptal infarct old.  No evidence of ischemia.  Compared to 04/26/2020, frequent  PVCs in ventricular bigeminal pattern not present.   Assessment     ICD-10-CM   1. Coronary artery disease involving native coronary artery of native heart with other form of angina pectoris ([place])  I25.118 aspirin (ASPIRIN CHILDRENS) 81 MG chewable tablet  2. Mixed hyperlipidemia  E78.2 rosuvastatin (CRESTOR) 20 MG tablet    Lipid Panel With LDL/HDL Ratio    LDL cholesterol, direct    LDL cholesterol, direct    Lipid Panel With LDL/HDL Ratio    ezetimibe (ZETIA) 10 MG tablet  3. Primary hypertension  I10   4. Right carotid bruit  R09.89      Meds ordered this encounter  Medications  . rosuvastatin (CRESTOR) 20 MG tablet    Sig: Take 1 tablet (20 mg total) by mouth daily.    Dispense:  30 tablet    Refill:  2    Discontinue Simvastatin  . aspirin (ASPIRIN CHILDRENS) 81 MG chewable tablet    Sig: Chew 1 tablet (81 mg total) by mouth  daily.  Marland Kitchen ezetimibe (ZETIA) 10 MG tablet    Sig: Take 1 tablet (10 mg total) by mouth every evening.    Dispense:  90 tablet    Refill:  3    Medications Discontinued During This Encounter  Medication Reason  . omeprazole (PRILOSEC) 20 MG capsule Error  . simvastatin (ZOCOR) 40 MG tablet Change in therapy  . clopidogrel (PLAVIX) 75 MG tablet Completed Course  . aspirin EC 81 MG tablet Change in therapy  . ezetimibe (ZETIA) 10 MG tablet Reorder   Orders Placed This Encounter  Procedures  . Lipid Panel With LDL/HDL Ratio    Standing Status:   Future    Number of Occurrences:   1    Standing Expiration Date:   03/26/2022  . LDL cholesterol, direct    Standing Status:   Future    Number of Occurrences:   1    Standing Expiration Date:   03/26/2022    Recommendations:   SOLVEIG FANGMAN  is a 70 y.o. Caucasian female patient with hypertension, hyperlipidemia, diabetes mellitus with peripheral neuropathy, chronic stable angina pectoris and dyspnea on exertion and coronary artery disease S/P stenting to the ostial RCA on 04/18/2020 with implantation of a 3.5 x 22 mm resolute.  Has diffuse disease of 60% in the mid LAD which can be treated medically and it is small.    Jessica [name] physical activity is limited due to spinal disease.  Jessica [name] also has a 40+ pack year history of smoking cigarettes.  Jessica [name] is tolerating simvastatin and Zetia combination without any side effects and Jessica [name] could not tolerate atorvastatin previously.  However review of the lipids reveal LDL not at goal.  Triglycerides are still elevated, Jessica [name] has been eating soft diet especially potatoes, noodles as Jessica [name] has dental issues.  Advised Jessica [name] to follow-up with Jessica [name] PCP regarding diabetes management and Jessica [name] will make changes to Jessica [name] diet.  I will discontinue simvastatin and switch to Crestor 20 mg daily along with continued Zetia.  I will repeat lipid profile testing in 3 months and see Jessica [name] back at that time.  As it has been a year since Jessica [name]  angioplasty, will discontinue Plavix.  Jessica [name] will continue with plain aspirin 81 mg daily.  With regard to carotid artery bruit, I reviewed Jessica [name] carotid duplex, Jessica [name] has minimal disease.   Adrian Prows, MD, [facility] 03/26/2021, 2:42 PM Office: [phone]

## 2021-04-16 DIAGNOSIS — E559 Vitamin D deficiency, unspecified: Secondary | ICD-10-CM | POA: Diagnosis not present

## 2021-04-16 DIAGNOSIS — E039 Hypothyroidism, unspecified: Secondary | ICD-10-CM | POA: Diagnosis not present

## 2021-04-16 DIAGNOSIS — Z79899 Other long term (current) drug therapy: Secondary | ICD-10-CM | POA: Diagnosis not present

## 2021-04-16 DIAGNOSIS — E1159 Type 2 diabetes mellitus with other circulatory complications: Secondary | ICD-10-CM | POA: Diagnosis not present

## 2021-04-16 DIAGNOSIS — I2510 Atherosclerotic heart disease of native coronary artery without angina pectoris: Secondary | ICD-10-CM | POA: Diagnosis not present

## 2021-04-16 DIAGNOSIS — E7800 Pure hypercholesterolemia, unspecified: Secondary | ICD-10-CM | POA: Diagnosis not present

## 2021-04-17 DIAGNOSIS — E119 Type 2 diabetes mellitus without complications: Secondary | ICD-10-CM | POA: Diagnosis not present

## 2021-04-17 DIAGNOSIS — H2512 Age-related nuclear cataract, left eye: Secondary | ICD-10-CM | POA: Diagnosis not present

## 2021-04-17 DIAGNOSIS — H524 Presbyopia: Secondary | ICD-10-CM | POA: Diagnosis not present

## 2021-04-17 DIAGNOSIS — H1789 Other corneal scars and opacities: Secondary | ICD-10-CM | POA: Diagnosis not present

## 2021-04-23 DIAGNOSIS — E039 Hypothyroidism, unspecified: Secondary | ICD-10-CM | POA: Diagnosis not present

## 2021-04-23 DIAGNOSIS — Z79899 Other long term (current) drug therapy: Secondary | ICD-10-CM | POA: Diagnosis not present

## 2021-04-23 DIAGNOSIS — E1159 Type 2 diabetes mellitus with other circulatory complications: Secondary | ICD-10-CM | POA: Diagnosis not present

## 2021-04-23 DIAGNOSIS — F419 Anxiety disorder, unspecified: Secondary | ICD-10-CM | POA: Diagnosis not present

## 2021-04-23 DIAGNOSIS — I10 Essential (primary) hypertension: Secondary | ICD-10-CM | POA: Diagnosis not present

## 2021-04-23 DIAGNOSIS — M5442 Lumbago with sciatica, left side: Secondary | ICD-10-CM | POA: Diagnosis not present

## 2021-04-26 DIAGNOSIS — Z79899 Other long term (current) drug therapy: Secondary | ICD-10-CM | POA: Diagnosis not present

## 2021-05-29 NOTE — Telephone Encounter (Signed)
From pt

## 2021-05-30 ENCOUNTER — Telehealth: Payer: Self-pay

## 2021-05-30 NOTE — Telephone Encounter (Signed)
H PCP is [name], MD. She should contact her as duplicate Rx from different providers is flagged

## 2021-05-30 NOTE — Telephone Encounter (Signed)
Her PCP is Dr. Theda Sers, [place]  (send same message to [facility]. Would prefer for hr to contact Dr. Rueben Bash

## 2021-05-31 NOTE — Telephone Encounter (Signed)
Spoke to patient she is aware

## 2021-06-08 ENCOUNTER — Other Ambulatory Visit: Payer: Self-pay | Admitting: Cardiology

## 2021-06-08 DIAGNOSIS — R0609 Other forms of dyspnea: Secondary | ICD-10-CM

## 2021-06-08 DIAGNOSIS — R0600 Dyspnea, unspecified: Secondary | ICD-10-CM

## 2021-06-18 ENCOUNTER — Other Ambulatory Visit: Payer: Self-pay

## 2021-06-18 ENCOUNTER — Encounter: Payer: Self-pay | Admitting: Cardiology

## 2021-06-18 ENCOUNTER — Ambulatory Visit: Payer: Medicare Other | Admitting: Cardiology

## 2021-06-18 VITALS — BP 115/67 | HR 62 | Temp 96.7°F | Resp 17 | Ht 66.0 in | Wt 188.6 lb

## 2021-06-18 DIAGNOSIS — E782 Mixed hyperlipidemia: Secondary | ICD-10-CM

## 2021-06-18 DIAGNOSIS — I10 Essential (primary) hypertension: Secondary | ICD-10-CM

## 2021-06-18 DIAGNOSIS — I25118 Atherosclerotic heart disease of native coronary artery with other forms of angina pectoris: Secondary | ICD-10-CM | POA: Diagnosis not present

## 2021-06-18 DIAGNOSIS — I493 Ventricular premature depolarization: Secondary | ICD-10-CM

## 2021-06-18 MED ORDER — ROSUVASTATIN CALCIUM 20 MG PO TABS: 20.0000 mg | ORAL_TABLET | Freq: Every day | ORAL | 3 refills | Status: AC

## 2021-06-18 NOTE — Progress Notes (Signed)
Primary Physician/Referring:  [name], DO  Patient ID: Jessica [name], female    DOB: [birth year], 70 y.o.   MRN: [id number]  Chief Complaint  Patient presents with  . Follow-up    3 MONTH   HPI:    Jessica [name]  is a 70 y.o. Caucasian female patient with hypertension, hyperlipidemia, diabetes mellitus with peripheral neuropathy, chronic stable angina pectoris and dyspnea on exertion and coronary artery disease s/p she called same stenting to RCA, moderate disease in mid LAD being treated medically [name] also. Her physical activity is limited due to spinal disease.  She also has a 40+ pack year history of smoking cigarettes.  She is eating tennis and hence has been only eating soft diet with mostly carbohydrate.  Diabetes since then has been uncontrolled.  She did not tolerate atorvastatin or simvastatin and presently tolerating Crestor 20 mg dose but has not had repeat labs for follow-up.  States that she will obtain this soon and she was very sick with cough for the past 1 month and has slowly started to feel better and she was recently started on antibiotics and also prednisone by her PCP. She has not had any angina pectoris, presents for a 61-monthoffice visit follow-up.    Past Medical History:  Diagnosis Date  . Allergy   . Anxiety   . Asthma   . Cervical stenosis of spine   . COPD (chronic obstructive pulmonary disease) ([place]   . DDD (degenerative disc disease)   . Depression   . Diabetes mellitus without complication ([place]   . Fatty liver   . Gastric ulcer   . GERD (gastroesophageal reflux disease)   . Heart murmur   . Hyperglycemia   . Hyperlipidemia   . Hypothyroidism   . Irregular heartbeat   . Migraines   . Multiple gastric ulcers   . Obesity   . Osteoarthritis   . Rheumatoid arthritis ([place]   . Sciatica   . Scoliosis   . Vitamin D deficiency    Past Surgical History:  Procedure Laterality Date  . ANTERIOR CERVICAL DECOMP/DISCECTOMY FUSION  12/08/2017    Procedure: Anterior Cervical Decompression/Discectomy Fusion - Cervical four-Cervical five - Cervical five-Cervical six - Cervical six-Cervical seven;  Surgeon: PEarnie Larsson MD;  [facility]  Service: Neurosurgery;;  . APPENDECTOMY    . CHOLECYSTECTOMY  2010  . COLONOSCOPY    . COLOSTOMY     removed 3 inches of colon 1987  . 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 CORONARY ATHERECTOMY N/A 04/18/2020   Procedure: CORONARY ATHERECTOMY;  Surgeon: GAdrian Prows MD;  [facility];  Service: Cardiovascular;  Laterality: N/A;  Ostial RCA  . CORONARY BALLOON ANGIOPLASTY N/A 02/29/2020   Procedure: CORONARY BALLOON ANGIOPLASTY;  Surgeon: PNigel Mormon MD;  [facility];  Service: Cardiovascular;  Laterality: N/A;  . CORONARY STENT INTERVENTION N/A 04/18/2020   Procedure: CORONARY STENT INTERVENTION;  Surgeon: GAdrian Prows MD;  [facility];  Service: Cardiovascular;  Laterality: N/A;  Ostial RCA  . HAND SURGERY Right 2006  . INTRAVASCULAR ULTRASOUND/IVUS N/A 02/29/2020   Procedure: Intravascular Ultrasound/IVUS;  Surgeon: PNigel Mormon MD;  [facility];  Service: Cardiovascular;  Laterality: N/A;  . LEFT HEART CATH N/A 02/29/2020   Procedure: Left Heart Cath;  Surgeon: PNigel Mormon MD;  [facility];  Service: Cardiovascular;  Laterality: N/A;  . LEFT HEART CATH AND CORONARY ANGIOGRAPHY N/A 02/15/2020   Procedure: LEFT HEART CATH AND CORONARY ANGIOGRAPHY;  Surgeon: Patwardhan, Manish J, MD;  [facility];  Service: Cardiovascular;  Laterality: N/A;  . Perforated Colon  1986   by Dr. Streck  . POLYPECTOMY    . TOTAL ABDOMINAL HYSTERECTOMY  1989   Dr. Neal  . UPPER GASTROINTESTINAL ENDOSCOPY    . UPPER GI ENDOSCOPY     Family History  Problem Relation Age of Onset  . Heart attack Father   . Heart disease Mother   . Hypertension  Mother   . Stroke Mother   . Breast cancer Sister   . Diabetes Brother   . Stroke Brother   . Colon cancer Neg Hx   . Esophageal cancer Neg Hx   . Liver cancer Neg Hx   . Pancreatic cancer Neg Hx   . Rectal cancer Neg Hx   . Stomach cancer Neg Hx     Social History   Tobacco Use  . Smoking status: Former    Packs/day: 1.00    Years: 40.00    Pack years: 40.00    Types: Cigarettes    Quit date: 12/08/2017    Years since quitting: 3.5  . Smokeless tobacco: Never  Substance Use Topics  . Alcohol use: No   Marital Status: Married  ROS  Review of Systems  Constitutional: Positive for malaise/fatigue.  Cardiovascular:  Negative for chest pain, dyspnea on exertion and leg swelling.  Musculoskeletal:  Positive for arthritis, back pain, joint pain and neck pain. Negative for muscle cramps and muscle weakness.  Gastrointestinal:  Negative for melena.  Objective  Blood pressure 115/67, pulse 62, temperature (!) 96.7 F (35.9 C), temperature source Temporal, resp. rate 17, height 5' 6" (1.676 m), weight 188 lb 9.6 oz (85.5 kg), SpO2 96 %.  Vitals with BMI 06/18/2021 03/26/2021 01/15/2021  Height 5' 6" 5' 6" 5' 6"  Weight 188 lbs 10 oz 196 lbs 13 oz 191 lbs 3 oz  BMI 30.46 31.78 30.88  Systolic 115 135 135  Diastolic 67 72 80  Pulse 62 54 64     Physical Exam Constitutional:      Comments: Appears older than stated age.   Neck:     Vascular: No carotid bruit or JVD.  Cardiovascular:     Rate and Rhythm: Normal rate and regular rhythm.     Pulses:          Popliteal pulses are 2+ on the right side and 2+ on the left side.       Dorsalis pedis pulses are 2+ on the right side and 1+ on the left side.       Posterior tibial pulses are 2+ on the right side and 2+ on the left side.     Heart sounds: No murmur heard.   No gallop.  Pulmonary:     Effort: Pulmonary effort is normal. No accessory muscle usage or respiratory distress.     Breath sounds: Normal breath sounds.   Abdominal:     General: Bowel sounds are normal.     Palpations: Abdomen is soft.  Musculoskeletal:        General: No swelling.  Skin:    Capillary Refill: Capillary refill takes less than 2 seconds.   Laboratory examination:   No results for input(s): NA, K, CL, CO2, GLUCOSE, BUN, CREATININE, CALCIUM, GFRNONAA, GFRAA in the last 8760 hours.  CrCl   cannot be calculated (Patient's most recent lab result is older than the maximum 21 days allowed.).  CMP Latest Ref Rng & Units 04/13/2020 03/01/2020 02/29/2020  Glucose 65 - 99 mg/dL 139(H) 120(H) 147(H)  BUN 8 - 27 mg/dL 7(L) 7(L) 8  Creatinine 0.57 - 1.00 mg/dL 0.51(L) 0.55 0.50  Sodium 134 - 144 mmol/L 142 138 138  Potassium 3.5 - 5.2 mmol/L 4.2 3.8 5.0  Chloride 96 - 106 mmol/L 103 108 104  CO2 20 - 29 mmol/L 23 21(L) -  Calcium 8.7 - 10.3 mg/dL 9.0 7.7(L) -  Total Protein 6.5 - 8.1 g/dL - - -  Total Bilirubin 0.3 - 1.2 mg/dL - - -  Alkaline Phos 38 - 126 U/L - - -  AST 15 - 41 U/L - - -  ALT 0 - 44 U/L - - -   CBC Latest Ref Rng & Units 04/13/2020 03/01/2020 02/29/2020  WBC 3.4 - 10.8 x10E3/uL 6.5 11.3(H) -  Hemoglobin 11.1 - 15.9 g/dL 14.0 11.2(L) 12.2  Hematocrit 34.0 - 46.6 % 40.0 32.8(L) 36.0  Platelets 150 - 400 K/uL - 250 -   Lipid Panel Recent Labs    01/04/21 1015 02/26/21 1019  CHOL 260* 194  TRIG 289* 258*  LDLCALC 147* 95  HDL 60 56    External labs:   Cholesterol, total 359.000 M 02/15/2019 HDL 62.000 06/02/2020 LDL-C 48.000 06/02/2020 Triglycerides 136.000 06/02/2020  A1C 5.500 02/29/2020 TSH 0.010 06/02/2020  Hemoglobin 14.000 G/ 04/13/2020  Creatinine, Serum 0.510 04/13/2020 Potassium 4.200 04/13/2020 ALT (SGPT) 22.000 09/25/2019  01/24/2020: Glucose 87, BUN/Cr 19/0.87. EGFR >=60. Na/K 139/5.0. Rest of the CMP normal H/H 15.1/43.4. MCV 94.8. Platelets 227  Cholesterol, total 359.000 M 02/15/2019 Triglycerides 352.000 09/13/2019 HDL 53.000 M 02/15/2019 LDL 140.000 09/24/2017  Medications and allergies    Allergies  Allergen Reactions  . Atorvastatin Other (See Comments)    Severe myalgia  . Metformin Hcl Diarrhea  . Adhesive [Tape] Itching, Rash and Other (See Comments)    Paper tape okay  . Latex Rash  . Oxycodone     Loopy, felt sick  . Remicade [Infliximab] Itching    Current Outpatient Medications on File Prior to Visit  Medication Sig Dispense Refill  . acetaminophen (TYLENOL) 500 MG tablet Take 500 mg by mouth every 6 (six) hours as needed for moderate pain or headache.    . aspirin (ASPIRIN CHILDRENS) 81 MG chewable tablet Chew 1 tablet (81 mg total) by mouth daily.    . butalbital-acetaminophen-caffeine (FIORICET, ESGIC) 50-325-40 MG per tablet Take 1 tablet by mouth 3 (three) times daily as needed for headache or migraine.     . cholestyramine (QUESTRAN) 4 g packet Take 4 g by mouth in the morning and at bedtime.    . dicyclomine (BENTYL) 20 MG tablet Take 20 mg by mouth 2 (two) times daily.    . ergocalciferol (VITAMIN D2) 50000 UNITS capsule Take 50,000 Units by mouth every Thursday.     . escitalopram (LEXAPRO) 20 MG tablet Take 20 mg by mouth daily.    . estradiol (ESTRACE) 2 MG tablet Take 2 mg by mouth daily.    . ezetimibe (ZETIA) 10 MG tablet Take 1 tablet (10 mg total) by mouth every evening. 90 tablet 3  . gabapentin (NEURONTIN) 300 MG capsule Take 300 mg by mouth 2 (two) times daily.    . glimepiride (AMARYL) 4 MG tablet Take 4 mg by mouth in the morning and at bedtime.     .   HYDROcodone-acetaminophen (NORCO/VICODIN) 5-325 MG tablet Take 1 tablet by mouth at bedtime.    . ibuprofen (ADVIL) 800 MG tablet Take 800 mg by mouth every 8 (eight) hours as needed.    . levothyroxine (SYNTHROID) 150 MCG tablet Take 150 mcg by mouth daily.    . loperamide (IMODIUM A-D) 2 MG tablet Take 4 mg by mouth 3 (three) times daily as needed for diarrhea or loose stools.    . losartan (COZAAR) 25 MG tablet Take 1 tablet (25 mg total) by mouth every evening. 90 tablet 3  . metoprolol  tartrate (LOPRESSOR) 25 MG tablet Take 1 tablet by mouth twice daily 180 tablet 0  . Multiple Vitamin (MULTIVITAMIN) tablet Take 1 tablet by mouth daily.    . nitroGLYCERIN (NITROSTAT) 0.4 MG SL tablet Place 1 tablet (0.4 mg total) under the tongue every 5 (five) minutes as needed for chest pain. 30 tablet 2  . omeprazole (PRILOSEC) 20 MG capsule Take 20 mg by mouth daily.    . pioglitazone (ACTOS) 15 MG tablet Take 15 mg by mouth daily.    . Potassium Chloride ER 20 MEQ TBCR Take 20 mEq by mouth 2 (two) times daily.     . ranolazine (RANEXA) 500 MG 12 hr tablet Take 1 tablet (500 mg total) by mouth 2 (two) times daily. 60 tablet 0  . rosuvastatin (CRESTOR) 20 MG tablet Take 1 tablet (20 mg total) by mouth daily. 30 tablet 2   No current facility-administered medications on file prior to visit.    Radiology:   No results found.  Cardiac Studies:   CT Cardiac scoring 02/01/2020: 1. Calcium score of 868. This is between the 90th and 100 percentile for females between the ages of 65 and 69. This is consistent with extensive atherosclerotic plaque. There is high likelihood of at least one significant coronary artery narrowing.  Lexiscan Tetrofosmin stress test 02/09/2020: Stress EKG is non-diagnostic, as this is pharmacological stress test. Rest and stress EKG show sinus rhythm with ventricular bigeminy/trigeminy. Small size, mild intensity, reversible perfusion defect in basal anteroseptal myocardium. Stress LVEF 49%. Intermediate risk stress test..  I had reviewed the angiograms after prior failed attempt and set her up for angiography  Echocardiogram 02/10/2020: Left ventricle cavity is normal in size. Moderate concentric hypertrophy of the left ventricle. Normal global wall motion. Normal LV systolic function with visual EF 55-60%. Diastolic function not assessed due to ventricular bigeminy.  Left atrial cavity is mildly dilated. Mild to moderate mitral regurgitation. IVC is dilated with  respiratory variation. Estimated RA pressure 8 mmHg.  Coronary angiogram 02/15/2020: LM: Short, normal. LAD: Small to medium caliber vessel with prox-mid, eccentric 60-70% stenosis with moderate calcification.  Does not reach the apex.          Occluded calcific Diag 1, likely originating at the site of LAD stenosis.         Under filled apical LAD         Left-to-left (LCx-LAD/Diag) and right-to-left (RCA-LAD/Diag) collaterals LCx: Large vessel. Prox 30% stenosis. RCA: Ostial, eccentric 80% stenosis with moderate calcification.   LVEDP normal.  Coronary angioplasty 04/18/2020  Ost RCA to Prox RCA lesion is 80% stenosed.  SP CSI orbital atherectomy with Diamondback followed by  STENT RESOLUTE ONYX 3.5X22.  Stenosis reduced to 0%.   ABI performed at PCP office 06/15/2020: Right ABI 1.1, left ABI 1.06.  Triphasic waveforms bilaterally.  Normal study.  Carotid artery duplex 01/30/2021: Mild atherosclerotic plaque seen in the right ICA.   Stenosis in the right external carotid artery (<50%). Mild atherosclerotic plaque seen in the left ICA. There is heterogeneous plaque (mild) in bilateral carotid arteries. Antegrade right vertebral artery flow. Antegrade left vertebral artery flow. Right external carotid stenosis probably sources of bruit. Follow up is appropriate when clinically indicated.   EKG  EKG 06/18/2021: Normal sinus rhythm at rate of 69 bpm, left atrial enlargement, leftward axis, incomplete right bundle branch block.  Frequent PVCs in bigeminal pattern.  Compared to 11/20/2020 bigeminal PVCs is new.  No change from 04/26/2020.  Assessment     ICD-10-CM   1. Primary hypertension  I10 EKG 12-Lead       No orders of the defined types were placed in this encounter.   Medications Discontinued During This Encounter  Medication Reason  . ALPRAZolam (XANAX) 0.5 MG tablet Error  . promethazine (PHENERGAN) 25 MG suppository Error   Orders Placed This Encounter  Procedures  .  EKG 12-Lead    Recommendations:   Jessica [name]  is a 69 y.o. Caucasian female patient with hypertension, hyperlipidemia, diabetes mellitus with peripheral neuropathy, chronic stable angina pectoris and dyspnea on exertion and coronary artery disease S/P stenting to the ostial RCA on 04/18/2020 with implantation of a 3.5 x 22 mm resolute.  Has diffuse disease of 60% in the mid LAD which can be treated medically and it is small.    Patient developed cough, not feeling well a month ago, she is still recuperating from the same, still has cough, nonproductive.  No PND or orthopnea., she has home tested for COVID which is negative, she was prescribed antibiotics and also prednisone by her PCP recently.  Her physical activity is limited due to spinal disease.  She also has a 40+ pack year history of smoking cigarettes.  She is presently on Crestor and tolerating this, needs lipid profile testing. Triglycerides are elevated, she has been eating soft diet especially potatoes, noodles as she has dental issues and does not like to use her dentures.  Crestor 20 mg daily along with continued Zetia.  Advised her to follow-up with her PCP regarding diabetes management and she will make changes to her diet. No changes in medications, BP is controlled. OV in 6 months.    Jay Ganji, MD, FACC 06/18/2021, 11:34 AM Office: [phone]  

## 2021-07-04 ENCOUNTER — Other Ambulatory Visit: Payer: Self-pay

## 2021-07-04 ENCOUNTER — Ambulatory Visit: Payer: Medicare Other

## 2021-07-04 DIAGNOSIS — I493 Ventricular premature depolarization: Secondary | ICD-10-CM | POA: Diagnosis not present

## 2021-07-04 DIAGNOSIS — I25118 Atherosclerotic heart disease of native coronary artery with other forms of angina pectoris: Secondary | ICD-10-CM

## 2021-07-15 ENCOUNTER — Other Ambulatory Visit: Payer: Self-pay | Admitting: Cardiology

## 2021-07-15 DIAGNOSIS — I25118 Atherosclerotic heart disease of native coronary artery with other forms of angina pectoris: Secondary | ICD-10-CM

## 2021-07-24 DIAGNOSIS — E039 Hypothyroidism, unspecified: Secondary | ICD-10-CM | POA: Diagnosis not present

## 2021-07-24 DIAGNOSIS — E1159 Type 2 diabetes mellitus with other circulatory complications: Secondary | ICD-10-CM | POA: Diagnosis not present

## 2021-07-31 DIAGNOSIS — E559 Vitamin D deficiency, unspecified: Secondary | ICD-10-CM | POA: Diagnosis not present

## 2021-07-31 DIAGNOSIS — E1159 Type 2 diabetes mellitus with other circulatory complications: Secondary | ICD-10-CM | POA: Diagnosis not present

## 2021-07-31 DIAGNOSIS — I10 Essential (primary) hypertension: Secondary | ICD-10-CM | POA: Diagnosis not present

## 2021-07-31 DIAGNOSIS — M0609 Rheumatoid arthritis without rheumatoid factor, multiple sites: Secondary | ICD-10-CM | POA: Diagnosis not present

## 2021-07-31 DIAGNOSIS — E039 Hypothyroidism, unspecified: Secondary | ICD-10-CM | POA: Diagnosis not present

## 2021-07-31 DIAGNOSIS — I2510 Atherosclerotic heart disease of native coronary artery without angina pectoris: Secondary | ICD-10-CM | POA: Diagnosis not present

## 2021-07-31 DIAGNOSIS — Z79899 Other long term (current) drug therapy: Secondary | ICD-10-CM | POA: Diagnosis not present

## 2021-08-14 DIAGNOSIS — I10 Essential (primary) hypertension: Secondary | ICD-10-CM | POA: Diagnosis not present

## 2021-08-14 DIAGNOSIS — M79642 Pain in left hand: Secondary | ICD-10-CM | POA: Diagnosis not present

## 2021-08-14 DIAGNOSIS — M1990 Unspecified osteoarthritis, unspecified site: Secondary | ICD-10-CM | POA: Diagnosis not present

## 2021-08-14 DIAGNOSIS — M25562 Pain in left knee: Secondary | ICD-10-CM | POA: Diagnosis not present

## 2021-08-14 DIAGNOSIS — M25512 Pain in left shoulder: Secondary | ICD-10-CM | POA: Diagnosis not present

## 2021-08-14 DIAGNOSIS — M25511 Pain in right shoulder: Secondary | ICD-10-CM | POA: Diagnosis not present

## 2021-08-14 DIAGNOSIS — G629 Polyneuropathy, unspecified: Secondary | ICD-10-CM | POA: Diagnosis not present

## 2021-08-14 DIAGNOSIS — R7612 Nonspecific reaction to cell mediated immunity measurement of gamma interferon antigen response without active tuberculosis: Secondary | ICD-10-CM | POA: Diagnosis not present

## 2021-08-14 DIAGNOSIS — M79641 Pain in right hand: Secondary | ICD-10-CM | POA: Diagnosis not present

## 2021-08-14 DIAGNOSIS — M542 Cervicalgia: Secondary | ICD-10-CM | POA: Diagnosis not present

## 2021-08-14 DIAGNOSIS — M0609 Rheumatoid arthritis without rheumatoid factor, multiple sites: Secondary | ICD-10-CM | POA: Diagnosis not present

## 2021-08-14 DIAGNOSIS — M25561 Pain in right knee: Secondary | ICD-10-CM | POA: Diagnosis not present

## 2021-08-15 ENCOUNTER — Telehealth: Payer: Self-pay | Admitting: Cardiology

## 2021-08-15 ENCOUNTER — Other Ambulatory Visit: Payer: Self-pay

## 2021-08-15 DIAGNOSIS — I25118 Atherosclerotic heart disease of native coronary artery with other forms of angina pectoris: Secondary | ICD-10-CM

## 2021-08-15 MED ORDER — RANOLAZINE ER 500 MG PO TB12
500.0000 mg | ORAL_TABLET | Freq: Two times a day (BID) | ORAL | 3 refills | Status: DC
Start: 2021-08-15 — End: 2022-08-20

## 2021-08-15 NOTE — Telephone Encounter (Signed)
done

## 2021-08-15 NOTE — Telephone Encounter (Signed)
Pt calling for a refill on medication

## 2021-08-28 DIAGNOSIS — M810 Age-related osteoporosis without current pathological fracture: Secondary | ICD-10-CM | POA: Diagnosis not present

## 2021-08-28 DIAGNOSIS — M8580 Other specified disorders of bone density and structure, unspecified site: Secondary | ICD-10-CM | POA: Diagnosis not present

## 2021-08-28 DIAGNOSIS — G629 Polyneuropathy, unspecified: Secondary | ICD-10-CM | POA: Diagnosis not present

## 2021-08-28 DIAGNOSIS — R7612 Nonspecific reaction to cell mediated immunity measurement of gamma interferon antigen response without active tuberculosis: Secondary | ICD-10-CM | POA: Diagnosis not present

## 2021-08-28 DIAGNOSIS — M8589 Other specified disorders of bone density and structure, multiple sites: Secondary | ICD-10-CM | POA: Diagnosis not present

## 2021-08-28 DIAGNOSIS — Z23 Encounter for immunization: Secondary | ICD-10-CM | POA: Diagnosis not present

## 2021-08-28 DIAGNOSIS — M0609 Rheumatoid arthritis without rheumatoid factor, multiple sites: Secondary | ICD-10-CM | POA: Diagnosis not present

## 2021-08-28 DIAGNOSIS — M542 Cervicalgia: Secondary | ICD-10-CM | POA: Diagnosis not present

## 2021-09-06 ENCOUNTER — Other Ambulatory Visit: Payer: Self-pay

## 2021-09-06 DIAGNOSIS — R0609 Other forms of dyspnea: Secondary | ICD-10-CM

## 2021-09-06 MED ORDER — METOPROLOL TARTRATE 25 MG PO TABS: 25.0000 mg | ORAL_TABLET | Freq: Two times a day (BID) | ORAL | 0 refills | Status: DC

## 2021-09-07 DIAGNOSIS — Z23 Encounter for immunization: Secondary | ICD-10-CM | POA: Diagnosis not present

## 2021-11-27 DIAGNOSIS — E039 Hypothyroidism, unspecified: Secondary | ICD-10-CM | POA: Diagnosis not present

## 2021-11-27 DIAGNOSIS — I2510 Atherosclerotic heart disease of native coronary artery without angina pectoris: Secondary | ICD-10-CM | POA: Diagnosis not present

## 2021-11-27 DIAGNOSIS — E7800 Pure hypercholesterolemia, unspecified: Secondary | ICD-10-CM | POA: Diagnosis not present

## 2021-11-27 DIAGNOSIS — E1159 Type 2 diabetes mellitus with other circulatory complications: Secondary | ICD-10-CM | POA: Diagnosis not present

## 2021-11-27 DIAGNOSIS — Z79899 Other long term (current) drug therapy: Secondary | ICD-10-CM | POA: Diagnosis not present

## 2021-11-27 DIAGNOSIS — E559 Vitamin D deficiency, unspecified: Secondary | ICD-10-CM | POA: Diagnosis not present

## 2021-11-30 DIAGNOSIS — M5442 Lumbago with sciatica, left side: Secondary | ICD-10-CM | POA: Diagnosis not present

## 2021-11-30 DIAGNOSIS — E039 Hypothyroidism, unspecified: Secondary | ICD-10-CM | POA: Diagnosis not present

## 2021-11-30 DIAGNOSIS — E7800 Pure hypercholesterolemia, unspecified: Secondary | ICD-10-CM | POA: Diagnosis not present

## 2021-11-30 DIAGNOSIS — E559 Vitamin D deficiency, unspecified: Secondary | ICD-10-CM | POA: Diagnosis not present

## 2021-11-30 DIAGNOSIS — K219 Gastro-esophageal reflux disease without esophagitis: Secondary | ICD-10-CM | POA: Diagnosis not present

## 2021-11-30 DIAGNOSIS — I2510 Atherosclerotic heart disease of native coronary artery without angina pectoris: Secondary | ICD-10-CM | POA: Diagnosis not present

## 2021-11-30 DIAGNOSIS — E1159 Type 2 diabetes mellitus with other circulatory complications: Secondary | ICD-10-CM | POA: Diagnosis not present

## 2021-11-30 DIAGNOSIS — Z0000 Encounter for general adult medical examination without abnormal findings: Secondary | ICD-10-CM | POA: Diagnosis not present

## 2021-11-30 DIAGNOSIS — Z79899 Other long term (current) drug therapy: Secondary | ICD-10-CM | POA: Diagnosis not present

## 2021-12-13 ENCOUNTER — Other Ambulatory Visit: Payer: Self-pay | Admitting: Cardiology

## 2021-12-13 DIAGNOSIS — R0609 Other forms of dyspnea: Secondary | ICD-10-CM

## 2021-12-14 ENCOUNTER — Other Ambulatory Visit: Payer: Self-pay

## 2021-12-14 ENCOUNTER — Encounter: Payer: Self-pay | Admitting: Cardiology

## 2021-12-14 DIAGNOSIS — E782 Mixed hyperlipidemia: Secondary | ICD-10-CM

## 2021-12-14 MED ORDER — EZETIMIBE 10 MG PO TABS: 10.0000 mg | ORAL_TABLET | Freq: Every evening | ORAL | 3 refills | Status: DC

## 2021-12-14 MED ORDER — METOPROLOL TARTRATE 25 MG PO TABS
25.0000 mg | ORAL_TABLET | Freq: Two times a day (BID) | ORAL | 0 refills | Status: DC
Start: 2021-12-14 — End: 2022-03-06

## 2021-12-26 ENCOUNTER — Ambulatory Visit: Payer: BLUE CROSS/BLUE SHIELD | Admitting: Cardiology

## 2022-02-17 DIAGNOSIS — U071 COVID-19: Secondary | ICD-10-CM | POA: Diagnosis not present

## 2022-03-05 ENCOUNTER — Encounter: Payer: Self-pay | Admitting: Cardiology

## 2022-03-05 ENCOUNTER — Other Ambulatory Visit: Payer: Self-pay | Admitting: Cardiology

## 2022-03-05 DIAGNOSIS — R0609 Other forms of dyspnea: Secondary | ICD-10-CM

## 2022-03-06 ENCOUNTER — Other Ambulatory Visit: Payer: Self-pay

## 2022-03-06 ENCOUNTER — Other Ambulatory Visit: Payer: Self-pay | Admitting: Cardiology

## 2022-03-06 DIAGNOSIS — I10 Essential (primary) hypertension: Secondary | ICD-10-CM

## 2022-03-06 DIAGNOSIS — R0609 Other forms of dyspnea: Secondary | ICD-10-CM

## 2022-03-06 MED ORDER — METOPROLOL TARTRATE 25 MG PO TABS: 25.0000 mg | ORAL_TABLET | Freq: Two times a day (BID) | ORAL | 2 refills | Status: DC

## 2022-03-06 MED ORDER — LOSARTAN POTASSIUM 25 MG PO TABS: 25.0000 mg | ORAL_TABLET | Freq: Every evening | ORAL | 3 refills | Status: DC

## 2022-03-25 ENCOUNTER — Ambulatory Visit: Payer: BLUE CROSS/BLUE SHIELD | Admitting: Cardiology

## 2022-05-29 DIAGNOSIS — Z79899 Other long term (current) drug therapy: Secondary | ICD-10-CM | POA: Diagnosis not present

## 2022-05-29 DIAGNOSIS — I2510 Atherosclerotic heart disease of native coronary artery without angina pectoris: Secondary | ICD-10-CM | POA: Diagnosis not present

## 2022-05-29 DIAGNOSIS — E039 Hypothyroidism, unspecified: Secondary | ICD-10-CM | POA: Diagnosis not present

## 2022-05-29 DIAGNOSIS — E1159 Type 2 diabetes mellitus with other circulatory complications: Secondary | ICD-10-CM | POA: Diagnosis not present

## 2022-05-29 DIAGNOSIS — E559 Vitamin D deficiency, unspecified: Secondary | ICD-10-CM | POA: Diagnosis not present

## 2022-05-29 DIAGNOSIS — E7800 Pure hypercholesterolemia, unspecified: Secondary | ICD-10-CM | POA: Diagnosis not present

## 2022-06-05 DIAGNOSIS — B372 Candidiasis of skin and nail: Secondary | ICD-10-CM | POA: Diagnosis not present

## 2022-06-05 DIAGNOSIS — I10 Essential (primary) hypertension: Secondary | ICD-10-CM | POA: Diagnosis not present

## 2022-06-05 DIAGNOSIS — R61 Generalized hyperhidrosis: Secondary | ICD-10-CM | POA: Diagnosis not present

## 2022-06-05 DIAGNOSIS — E039 Hypothyroidism, unspecified: Secondary | ICD-10-CM | POA: Diagnosis not present

## 2022-06-05 DIAGNOSIS — E1140 Type 2 diabetes mellitus with diabetic neuropathy, unspecified: Secondary | ICD-10-CM | POA: Diagnosis not present

## 2022-06-05 DIAGNOSIS — Z955 Presence of coronary angioplasty implant and graft: Secondary | ICD-10-CM | POA: Diagnosis not present

## 2022-06-05 DIAGNOSIS — E1159 Type 2 diabetes mellitus with other circulatory complications: Secondary | ICD-10-CM | POA: Diagnosis not present

## 2022-06-05 DIAGNOSIS — M1990 Unspecified osteoarthritis, unspecified site: Secondary | ICD-10-CM | POA: Diagnosis not present

## 2022-06-05 DIAGNOSIS — F419 Anxiety disorder, unspecified: Secondary | ICD-10-CM | POA: Diagnosis not present

## 2022-06-05 DIAGNOSIS — M0609 Rheumatoid arthritis without rheumatoid factor, multiple sites: Secondary | ICD-10-CM | POA: Diagnosis not present

## 2022-08-08 ENCOUNTER — Other Ambulatory Visit: Payer: Self-pay | Admitting: Cardiology

## 2022-08-08 DIAGNOSIS — I25118 Atherosclerotic heart disease of native coronary artery with other forms of angina pectoris: Secondary | ICD-10-CM

## 2022-08-20 ENCOUNTER — Telehealth: Payer: Self-pay | Admitting: Cardiology

## 2022-08-20 ENCOUNTER — Other Ambulatory Visit: Payer: Self-pay

## 2022-08-20 DIAGNOSIS — I25118 Atherosclerotic heart disease of native coronary artery with other forms of angina pectoris: Secondary | ICD-10-CM

## 2022-08-20 MED ORDER — RANOLAZINE ER 500 MG PO TB12: 500.0000 mg | ORAL_TABLET | Freq: Two times a day (BID) | ORAL | 3 refills | Status: DC

## 2022-08-20 NOTE — Telephone Encounter (Signed)
Done

## 2022-08-20 NOTE — Telephone Encounter (Signed)
Patient needs refill on ranolazine.

## 2022-09-02 DIAGNOSIS — E1140 Type 2 diabetes mellitus with diabetic neuropathy, unspecified: Secondary | ICD-10-CM | POA: Diagnosis not present

## 2022-09-02 DIAGNOSIS — E1159 Type 2 diabetes mellitus with other circulatory complications: Secondary | ICD-10-CM | POA: Diagnosis not present

## 2022-09-02 DIAGNOSIS — E039 Hypothyroidism, unspecified: Secondary | ICD-10-CM | POA: Diagnosis not present

## 2022-09-05 ENCOUNTER — Ambulatory Visit: Payer: Medicare Other | Admitting: Cardiology

## 2022-09-05 ENCOUNTER — Encounter: Payer: Self-pay | Admitting: Cardiology

## 2022-09-05 VITALS — BP 124/61 | HR 47 | Temp 98.0°F | Resp 16 | Ht 66.0 in | Wt 213.2 lb

## 2022-09-05 DIAGNOSIS — I951 Orthostatic hypotension: Secondary | ICD-10-CM | POA: Diagnosis not present

## 2022-09-05 DIAGNOSIS — I493 Ventricular premature depolarization: Secondary | ICD-10-CM | POA: Diagnosis not present

## 2022-09-05 DIAGNOSIS — M1990 Unspecified osteoarthritis, unspecified site: Secondary | ICD-10-CM | POA: Diagnosis not present

## 2022-09-05 DIAGNOSIS — R55 Syncope and collapse: Secondary | ICD-10-CM | POA: Diagnosis not present

## 2022-09-05 DIAGNOSIS — Z23 Encounter for immunization: Secondary | ICD-10-CM | POA: Diagnosis not present

## 2022-09-05 DIAGNOSIS — R0609 Other forms of dyspnea: Secondary | ICD-10-CM

## 2022-09-05 DIAGNOSIS — M549 Dorsalgia, unspecified: Secondary | ICD-10-CM | POA: Diagnosis not present

## 2022-09-05 DIAGNOSIS — E1140 Type 2 diabetes mellitus with diabetic neuropathy, unspecified: Secondary | ICD-10-CM | POA: Diagnosis not present

## 2022-09-05 DIAGNOSIS — E1159 Type 2 diabetes mellitus with other circulatory complications: Secondary | ICD-10-CM | POA: Diagnosis not present

## 2022-09-05 DIAGNOSIS — I25118 Atherosclerotic heart disease of native coronary artery with other forms of angina pectoris: Secondary | ICD-10-CM | POA: Diagnosis not present

## 2022-09-05 DIAGNOSIS — M0609 Rheumatoid arthritis without rheumatoid factor, multiple sites: Secondary | ICD-10-CM | POA: Diagnosis not present

## 2022-09-05 DIAGNOSIS — E039 Hypothyroidism, unspecified: Secondary | ICD-10-CM | POA: Diagnosis not present

## 2022-09-05 DIAGNOSIS — F329 Major depressive disorder, single episode, unspecified: Secondary | ICD-10-CM | POA: Diagnosis not present

## 2022-09-05 NOTE — Progress Notes (Signed)
Primary Physician/Referring:  [name], DO  Patient ID: [name], female    DOB: 20-Jun-1951, 71 y.o.   MRN: [id number]  Chief Complaint  Patient presents with   Hypertension   Coronary Artery Disease   HPI:    Jessica [name]  is a 71 y.o. Caucasian female patient with hypertension, hyperlipidemia, diabetes mellitus with peripheral neuropathy, chronic stable angina pectoris and dyspnea on exertion and coronary artery disease S/P stenting to the ostial RCA on 04/18/2020 and has moderate proximal and mid LAD diffuse disease treated medically. She also has a 40+ pack year history of smoking cigarettes.  She presents for annual visit, states that over the past 2 months, she has had recurrent episodes of near syncope where when she gets up from her sofa to walk to the bathroom, halfway down she feels like she is going to pass out.  She has had 1 episode of falling to the ground about 2 weeks ago and since then has had some back pain, x-ray revealed no significant abnormality.  She is also noticed marked worsening dyspnea on exertion, even walking within the house.  She gets markedly dyspneic.  Denies chest pain.   Social History   Tobacco Use   Smoking status: Former    Packs/day: 1.00    Years: 40.00    Total pack years: 40.00    Types: Cigarettes    Quit date: 12/08/2017    Years since quitting: 4.7   Smokeless tobacco: Never  Substance Use Topics   Alcohol use: No   Marital Status: Married   ROS  Review of Systems  Cardiovascular:  Positive for dyspnea on exertion and near-syncope. Negative for chest pain, leg swelling and orthopnea.   Objective  Blood pressure 124/61, pulse (!) 47, temperature 98 F (36.7 C), temperature source Temporal, resp. rate 16, height 5' 6"  (1.676 m), weight 213 lb 3.2 oz (96.7 kg), SpO2 97 %.     09/05/2022    1:08 PM 06/18/2021   10:57 AM 03/26/2021    2:01 PM  Vitals with BMI  Height 5' 6"  5' 6"  5' 6"   Weight 213 lbs 3 oz 188 lbs 10 oz 196  lbs 13 oz  BMI 34.43 60.10 93.23  Systolic 557 322 025  Diastolic 61 67 72  Pulse 47 62 54    Orthostatic VS for the past 72 hrs (Last 3 readings):  Orthostatic BP Patient Position BP Location Cuff Size Orthostatic Pulse  09/06/22 0820 116/77 Standing Left Arm Large 87  09/06/22 0819 118/77 Sitting Left Arm Large 76  09/06/22 0818 123/74 Supine Left Arm Large 73     Physical Exam Constitutional:      Appearance: She is obese.  Neck:     Vascular: No carotid bruit or JVD.  Cardiovascular:     Rate and Rhythm: Normal rate and regular rhythm.     Pulses: Intact distal pulses.     Heart sounds: Normal heart sounds. No murmur heard.    No gallop.  Pulmonary:     Effort: Pulmonary effort is normal.     Breath sounds: Normal breath sounds.  Abdominal:     General: Bowel sounds are normal.     Palpations: Abdomen is soft.  Musculoskeletal:     Right lower leg: No edema.     Left lower leg: No edema.    Laboratory examination:  External labs:   Labs 11/30/2021:  Sodium 142, potassium 3.9, BUN 15, creatinine 0.65, EGFR 68  mL, LFTs normal.  A1c 6.0%.  TSH 2.65, normal.  Total cholesterol 196, triglycerides 249, HDL 71, LDL 75.  Hb 13.8/HCT 41.4, platelets 214.  Medications and allergies   Allergies  Allergen Reactions   Atorvastatin Other (See Comments)    Severe myalgia   Metformin Hcl Diarrhea   Adhesive [Tape] Itching, Rash and Other (See Comments)    Paper tape okay   Latex Rash   Oxycodone     Loopy, felt sick   Remicade [Infliximab] Itching      Radiology:   ABI performed at PCP office 06/15/2020: Right ABI 1.1, left ABI 1.06.  Triphasic waveforms bilaterally.  Normal study.  Carotid artery duplex 01/30/2021: Mild atherosclerotic plaque seen in the right ICA. Stenosis in the right external carotid artery (<50%). Mild atherosclerotic plaque seen in the left ICA. There is heterogeneous plaque (mild) in bilateral carotid arteries. Antegrade right  vertebral artery flow. Antegrade left vertebral artery flow. Right external carotid stenosis probably sources of bruit. Follow up is appropriate when clinically indicated.  Cardiac Studies:   CT Cardiac scoring 02/01/2020: 1. Calcium score of 868. This is between the 90th and 100 percentile for females between the ages of 43 and 80. This is consistent with extensive atherosclerotic plaque. There is high likelihood of at least one significant coronary artery narrowing.  Lexiscan Tetrofosmin stress test 02/09/2020: Stress EKG is non-diagnostic, as this is pharmacological stress test. Rest and stress EKG show sinus rhythm with ventricular bigeminy/trigeminy. Small size, mild intensity, reversible perfusion defect in basal anteroseptal myocardium. Stress LVEF 49%. Intermediate risk stress test..  I had reviewed the angiograms after prior failed attempt and set her up for angiography  Echocardiogram 02/10/2020: Left ventricle cavity is normal in size. Moderate concentric hypertrophy of the left ventricle. Normal global wall motion. Normal LV systolic function with visual EF 55-60%. Diastolic function not assessed due to ventricular bigeminy.  Left atrial cavity is mildly dilated. Mild to moderate mitral regurgitation. IVC is dilated with respiratory variation. Estimated RA pressure 8 mmHg.  Coronary angiogram 02/15/2020: LM: Short, normal. LAD: Small to medium caliber vessel with prox-mid, eccentric 60-70% stenosis with moderate calcification.  Does not reach the apex.          Occluded calcific Diag 1, likely originating at the site of LAD stenosis.         Under filled apical LAD         Left-to-left (LCx-LAD/Diag) and right-to-left (RCA-LAD/Diag) collaterals LCx: Large vessel. Prox 30% stenosis. RCA: Ostial, eccentric 80% stenosis with moderate calcification.   LVEDP normal.  Coronary angioplasty 04/18/2020   Prox RCA: CSI orbital atherectomy with Diamondback followed by  [place]  3.5X22 on  04/18/2020       EKG EKG 09/20/2022: Normal sinus rhythm at rate of 81 bpm, leftward axis, incomplete right bundle branch block.  Frequent PVCs (4) some of them (3) in bigeminal pattern.  No significant change from 06/18/2021, 11/20/2020.   Assessment     ICD-10-CM   1. Coronary artery disease involving native coronary artery of native heart with other form of angina pectoris ([place])  I25.118 EKG 12-Lead    PCV ECHOCARDIOGRAM COMPLETE    PCV MYOCARDIAL PERFUSION WITH LEXISCAN    2. Dyspnea on exertion  R06.09 PCV ECHOCARDIOGRAM COMPLETE    PCV MYOCARDIAL PERFUSION WITH LEXISCAN    3. Vasovagal near syncope  R55     4. 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 Orthostatic hypotension  I95.1 Compression stockings       No orders of the defined types were placed in this encounter.   Medications Discontinued During This Encounter  Medication Reason   ibuprofen (ADVIL) 800 MG tablet Entry Error    Orders Placed This Encounter  Procedures   Compression stockings    30-40 mm Hg pressure   PCV MYOCARDIAL PERFUSION WITH LEXISCAN    Standing Status:   Future    Standing Expiration Date:   11/05/2022   EKG 12-Lead   PCV ECHOCARDIOGRAM COMPLETE    Standing Status:   Future    Standing Expiration Date:   09/06/2023    Recommendations:   Jessica [name]  is a 71 y.o. Caucasian female patient with hypertension, hyperlipidemia, diabetes mellitus with peripheral neuropathy, chronic stable angina pectoris and dyspnea on exertion and coronary artery disease S/P stenting to the ostial RCA on 04/18/2020 and has moderate proximal and mid LAD diffuse disease treated medically. She also has a 40+ pack year history of smoking cigarettes quit in 2019.   Patient presenting for an visit and follow-up of coronary disease, for the past 2 months she has noticed frequent episodes of near syncope especially when she stands and tries to walk  up.  She has also noticed marked dyspnea, class III.  No PND or orthopnea.  Today there is no clinical evidence heart failure.  Concerned about her presentation, will repeat echocardiogram in view of marked dyspnea on exertion and frequent PVCs to evaluate LV systolic function.  I will also repeat Lexiscan nuclear stress test as she has multivessel coronary disease and to evaluate for any recurrence of restenosis or progression of coronary disease and also to evaluate the LAD stenosis.  Reviewed her labs, renal function is normal, diabetes is well controlled, lipids are at goal except for elevated triglycerides related to poor dietary habits.  Weight loss again discussed with the patient.  I would like to see her back in 4 to 6 weeks for follow-up.  No changes in the medications were done today.   Adrian Prows, MD, [facility] 09/06/2022, 5:11 PM Office: [phone]

## 2022-09-09 ENCOUNTER — Other Ambulatory Visit: Payer: Self-pay | Admitting: Family Medicine

## 2022-09-09 DIAGNOSIS — I714 Abdominal aortic aneurysm, without rupture, unspecified: Secondary | ICD-10-CM

## 2022-09-11 DIAGNOSIS — K760 Fatty (change of) liver, not elsewhere classified: Secondary | ICD-10-CM | POA: Diagnosis not present

## 2022-09-11 DIAGNOSIS — N281 Cyst of kidney, acquired: Secondary | ICD-10-CM | POA: Diagnosis not present

## 2022-09-11 DIAGNOSIS — I77819 Aortic ectasia, unspecified site: Secondary | ICD-10-CM | POA: Diagnosis not present

## 2022-09-11 DIAGNOSIS — R9389 Abnormal findings on diagnostic imaging of other specified body structures: Secondary | ICD-10-CM | POA: Diagnosis not present

## 2022-09-24 ENCOUNTER — Ambulatory Visit: Payer: Medicare Other

## 2022-09-24 DIAGNOSIS — I493 Ventricular premature depolarization: Secondary | ICD-10-CM

## 2022-09-24 DIAGNOSIS — R0609 Other forms of dyspnea: Secondary | ICD-10-CM | POA: Diagnosis not present

## 2022-09-24 DIAGNOSIS — I25118 Atherosclerotic heart disease of native coronary artery with other forms of angina pectoris: Secondary | ICD-10-CM | POA: Diagnosis not present

## 2022-10-17 ENCOUNTER — Ambulatory Visit: Payer: BLUE CROSS/BLUE SHIELD | Admitting: Cardiology

## 2022-10-18 DIAGNOSIS — M0609 Rheumatoid arthritis without rheumatoid factor, multiple sites: Secondary | ICD-10-CM | POA: Diagnosis not present

## 2022-10-18 DIAGNOSIS — I10 Essential (primary) hypertension: Secondary | ICD-10-CM | POA: Diagnosis not present

## 2022-10-18 DIAGNOSIS — F419 Anxiety disorder, unspecified: Secondary | ICD-10-CM | POA: Diagnosis not present

## 2022-10-18 DIAGNOSIS — Z79899 Other long term (current) drug therapy: Secondary | ICD-10-CM | POA: Diagnosis not present

## 2022-10-18 DIAGNOSIS — M47816 Spondylosis without myelopathy or radiculopathy, lumbar region: Secondary | ICD-10-CM | POA: Diagnosis not present

## 2022-10-18 DIAGNOSIS — I2510 Atherosclerotic heart disease of native coronary artery without angina pectoris: Secondary | ICD-10-CM | POA: Diagnosis not present

## 2022-10-18 DIAGNOSIS — E039 Hypothyroidism, unspecified: Secondary | ICD-10-CM | POA: Diagnosis not present

## 2022-10-18 DIAGNOSIS — F329 Major depressive disorder, single episode, unspecified: Secondary | ICD-10-CM | POA: Diagnosis not present

## 2022-10-18 DIAGNOSIS — E7800 Pure hypercholesterolemia, unspecified: Secondary | ICD-10-CM | POA: Diagnosis not present

## 2022-10-18 DIAGNOSIS — E118 Type 2 diabetes mellitus with unspecified complications: Secondary | ICD-10-CM | POA: Diagnosis not present

## 2022-10-28 ENCOUNTER — Ambulatory Visit: Payer: BLUE CROSS/BLUE SHIELD | Admitting: Cardiology

## 2022-10-28 NOTE — Progress Notes (Deleted)
Primary Physician/Referring:  [name], DO  Patient ID: Jessica [name], female    DOB: 1951-09-03, 71 y.o.   MRN: [id number]  No chief complaint on file.  HPI:    Jessica [name]  is a 71 y.o. Caucasian female patient with hypertension, hyperlipidemia, diabetes mellitus with peripheral neuropathy, chronic stable angina pectoris and dyspnea on exertion and coronary artery disease S/P stenting to the ostial RCA on 04/18/2020 and has moderate proximal and mid LAD diffuse disease treated medically. She also has a 40+ pack year history of smoking cigarettes.  She presents for annual visit, states that over the past 2 months, she has had recurrent episodes of near syncope where when she gets up from her sofa to walk to the bathroom, halfway down she feels like she is going to pass out.  She has had 1 episode of falling to the ground about 2 weeks ago and since then has had some back pain, x-ray revealed no significant abnormality.  She is also noticed marked worsening dyspnea on exertion, even walking within the house.  She gets markedly dyspneic.  Denies chest pain.   Social History   Tobacco Use   Smoking status: Former    Packs/day: 1.00    Years: 40.00    Total pack years: 40.00    Types: Cigarettes    Quit date: 12/08/2017    Years since quitting: 4.8   Smokeless tobacco: Never  Substance Use Topics   Alcohol use: No   Marital Status: Married   ROS  Review of Systems  Cardiovascular:  Positive for dyspnea on exertion and near-syncope. Negative for chest pain, leg swelling and orthopnea.   Objective  There were no vitals taken for this visit.     09/05/2022    1:08 PM 06/18/2021   10:57 AM 03/26/2021    2:01 PM  Vitals with BMI  Height _0  _1  _2   Weight 213 lbs 3 oz 188 lbs 10 oz 196 lbs 13 oz  BMI 34.43 66.06 30.16  Systolic 010 932 355  Diastolic 61 67 72  Pulse 47 62 54    No data found.    Physical Exam Constitutional:      Appearance: She is obese.  Neck:      Vascular: No carotid bruit or JVD.  Cardiovascular:     Rate and Rhythm: Normal rate and regular rhythm.     Pulses: Intact distal pulses.     Heart sounds: Normal heart sounds. No murmur heard.    No gallop.  Pulmonary:     Effort: Pulmonary effort is normal.     Breath sounds: Normal breath sounds.  Abdominal:     General: Bowel sounds are normal.     Palpations: Abdomen is soft.  Musculoskeletal:     Right lower leg: No edema.     Left lower leg: No edema.    Laboratory examination:  External labs:   Labs 11/30/2021:  Sodium 142, potassium 3.9, BUN 15, creatinine 0.65, EGFR 68 mL, LFTs normal.  A1c 6.0%.  TSH 2.65, normal.  Total cholesterol 196, triglycerides 249, HDL 71, LDL 75.  Hb 13.8/HCT 41.4, platelets 214.  Medications and allergies   Allergies  Allergen Reactions   Atorvastatin Other (See Comments)    Severe myalgia   Metformin Hcl Diarrhea   Adhesive [Tape] Itching, Rash and Other (See Comments)    Paper tape okay   Latex Rash   Oxycodone     Loopy,  felt sick   Remicade [Infliximab] Itching      Radiology:   ABI performed at PCP office 06/15/2020: Right ABI 1.1, left ABI 1.06.  Triphasic waveforms bilaterally.  Normal study.  Carotid artery duplex 01/30/2021: Mild atherosclerotic plaque seen in the right ICA. Stenosis in the right external carotid artery (<50%). Mild atherosclerotic plaque seen in the left ICA. There is heterogeneous plaque (mild) in bilateral carotid arteries. Antegrade right vertebral artery flow. Antegrade left vertebral artery flow. Right external carotid stenosis probably sources of bruit. Follow up is appropriate when clinically indicated.  Cardiac Studies:   CT Cardiac scoring 02/01/2020: 1. Calcium score of 868. This is between the 90th and 100 percentile for females between the ages of 69 and 82. This is consistent with extensive atherosclerotic plaque. There is high likelihood of at least one significant coronary  artery narrowing.  Lexiscan Tetrofosmin stress test 02/09/2020: Stress EKG is non-diagnostic, as this is pharmacological stress test. Rest and stress EKG show sinus rhythm with ventricular bigeminy/trigeminy. Small size, mild intensity, reversible perfusion defect in basal anteroseptal myocardium. Stress LVEF 49%. Intermediate risk stress test..  I had reviewed the angiograms after prior failed attempt and set her up for angiography  Coronary angiogram 02/15/2020: LM: Short, normal. LAD: Small to medium caliber vessel with prox-mid, eccentric 60-70% stenosis with moderate calcification.  Does not reach the apex.          Occluded calcific Diag 1, likely originating at the site of LAD stenosis.         Under filled apical LAD         Left-to-left (LCx-LAD/Diag) and right-to-left (RCA-LAD/Diag) collaterals LCx: Large vessel. Prox 30% stenosis. RCA: Ostial, eccentric 80% stenosis with moderate calcification.   LVEDP normal.  Coronary angioplasty 04/18/2020   Prox RCA: CSI orbital atherectomy with Diamondback followed by  Blomkest 3.5X22 on  04/18/2020    PCV ECHOCARDIOGRAM COMPLETE 09/24/2022  Narrative Echocardiogram 09/24/2022: Normal LV systolic function with visual EF 55-60%. Left ventricle cavity is normal in size. Mild concentric hypertrophy of the left ventricle. Normal global wall motion. Doppler evidence of grade I (impaired) diastolic dysfunction, normal LAP. Calculated EF 59%. Structurally normal mitral valve.  Mild (Grade I) mitral regurgitation. Structurally normal tricuspid valve with trace regurgitation. No evidence of pulmonary hypertension. No significant change compared to 07/2021.   PCV MYOCARDIAL PERFUSION WITH LEXISCAN 09/24/2022  Narrative Lexiscan Sestamibi stress test 09/24/2022: Lexiscan nuclear stress test performed using 1-day protocol. Stress EKG is non-diagnostic, as this is pharmacological stress test. In addition, rest and stress EKG showed  sinus rhythm, frequent PVC's, nonspecific ST-T abnormality. Normal myocardial perfusion. Stress LV EF is mildly dysfunctional 42%, possibly underestimated due to frequent PVC's. Intermediate risk study. No significant change compared to previous study in 2021.    EKG  EKG 09/20/2022: Normal sinus rhythm at rate of 81 bpm, leftward axis, incomplete right bundle branch block.  Frequent PVCs (4) some of them (3) in bigeminal pattern.  No significant change from 06/18/2021, 11/20/2020.   Assessment     ICD-10-CM   1. Vasovagal near syncope  R55     2. Orthostatic hypotension  I95.1     3. Primary hypertension  I10        No orders of the defined types were placed in this encounter.   There are no discontinued medications.   No orders of the defined types were placed in this encounter.   Recommendations:   Jessica [name]  is a  71 y.o. Caucasian female patient with hypertension, hyperlipidemia, diabetes mellitus with peripheral neuropathy, chronic stable angina pectoris and dyspnea on exertion and coronary artery disease S/P stenting to the ostial RCA on 04/18/2020 and has proximal and mid LAD diffuse disease treated medically. She also has a 40+ pack year history of smoking cigarettes quit in 2019.   Patient seen 4-5 weeks ago for CAD f/u and over the past 2 months she has noticed frequent episodes of near syncope especially when she stands and tries to walk up and worsening dyspnea.     Adrian Prows, MD, [facility] 10/28/2022, 7:14 AM Office: [phone]

## 2022-11-28 ENCOUNTER — Other Ambulatory Visit: Payer: Self-pay | Admitting: Cardiology

## 2022-11-28 DIAGNOSIS — E782 Mixed hyperlipidemia: Secondary | ICD-10-CM

## 2022-12-26 ENCOUNTER — Other Ambulatory Visit: Payer: Self-pay | Admitting: Cardiology

## 2022-12-26 DIAGNOSIS — R0609 Other forms of dyspnea: Secondary | ICD-10-CM

## 2023-02-22 ENCOUNTER — Other Ambulatory Visit: Payer: Self-pay | Admitting: Cardiology

## 2023-02-22 DIAGNOSIS — E782 Mixed hyperlipidemia: Secondary | ICD-10-CM

## 2023-02-28 ENCOUNTER — Other Ambulatory Visit: Payer: Self-pay | Admitting: Cardiology

## 2023-02-28 DIAGNOSIS — I10 Essential (primary) hypertension: Secondary | ICD-10-CM

## 2023-03-14 DIAGNOSIS — I2510 Atherosclerotic heart disease of native coronary artery without angina pectoris: Secondary | ICD-10-CM | POA: Diagnosis not present

## 2023-03-14 DIAGNOSIS — E7800 Pure hypercholesterolemia, unspecified: Secondary | ICD-10-CM | POA: Diagnosis not present

## 2023-03-14 DIAGNOSIS — E118 Type 2 diabetes mellitus with unspecified complications: Secondary | ICD-10-CM | POA: Diagnosis not present

## 2023-03-14 DIAGNOSIS — E039 Hypothyroidism, unspecified: Secondary | ICD-10-CM | POA: Diagnosis not present

## 2023-03-14 DIAGNOSIS — I10 Essential (primary) hypertension: Secondary | ICD-10-CM | POA: Diagnosis not present

## 2023-03-14 DIAGNOSIS — Z79899 Other long term (current) drug therapy: Secondary | ICD-10-CM | POA: Diagnosis not present

## 2023-03-21 DIAGNOSIS — E118 Type 2 diabetes mellitus with unspecified complications: Secondary | ICD-10-CM | POA: Diagnosis not present

## 2023-03-21 DIAGNOSIS — Z0000 Encounter for general adult medical examination without abnormal findings: Secondary | ICD-10-CM | POA: Diagnosis not present

## 2023-03-21 DIAGNOSIS — Z23 Encounter for immunization: Secondary | ICD-10-CM | POA: Diagnosis not present

## 2023-03-21 DIAGNOSIS — I2510 Atherosclerotic heart disease of native coronary artery without angina pectoris: Secondary | ICD-10-CM | POA: Diagnosis not present

## 2023-03-21 DIAGNOSIS — E0840 Diabetes mellitus due to underlying condition with diabetic neuropathy, unspecified: Secondary | ICD-10-CM | POA: Diagnosis not present

## 2023-03-21 DIAGNOSIS — E7800 Pure hypercholesterolemia, unspecified: Secondary | ICD-10-CM | POA: Diagnosis not present

## 2023-03-21 DIAGNOSIS — E039 Hypothyroidism, unspecified: Secondary | ICD-10-CM | POA: Diagnosis not present

## 2023-03-21 DIAGNOSIS — Z79899 Other long term (current) drug therapy: Secondary | ICD-10-CM | POA: Diagnosis not present

## 2023-03-21 DIAGNOSIS — I10 Essential (primary) hypertension: Secondary | ICD-10-CM | POA: Diagnosis not present

## 2023-03-21 DIAGNOSIS — K915 Postcholecystectomy syndrome: Secondary | ICD-10-CM | POA: Diagnosis not present

## 2023-03-23 ENCOUNTER — Other Ambulatory Visit: Payer: Self-pay | Admitting: Cardiology

## 2023-03-23 DIAGNOSIS — R0609 Other forms of dyspnea: Secondary | ICD-10-CM

## 2023-05-29 ENCOUNTER — Other Ambulatory Visit: Payer: Self-pay | Admitting: Cardiology

## 2023-05-29 DIAGNOSIS — I10 Essential (primary) hypertension: Secondary | ICD-10-CM

## 2023-06-21 ENCOUNTER — Other Ambulatory Visit: Payer: Self-pay | Admitting: Cardiology

## 2023-06-21 DIAGNOSIS — R0609 Other forms of dyspnea: Secondary | ICD-10-CM

## 2023-08-04 ENCOUNTER — Other Ambulatory Visit: Payer: Self-pay | Admitting: Cardiology

## 2023-08-04 DIAGNOSIS — I25118 Atherosclerotic heart disease of native coronary artery with other forms of angina pectoris: Secondary | ICD-10-CM

## 2023-09-08 ENCOUNTER — Telehealth: Payer: Self-pay | Admitting: Cardiology

## 2023-09-08 DIAGNOSIS — I25118 Atherosclerotic heart disease of native coronary artery with other forms of angina pectoris: Secondary | ICD-10-CM

## 2023-09-08 NOTE — Telephone Encounter (Signed)
 *  STAT* If patient is at the pharmacy, call can be transferred to refill team.   1. Which medications need to be refilled? (please list name of each medication and dose if known)   ranolazine (RANEXA) 500 MG 12 hr tablet     2. Would you like to learn more about the convenience, safety, & potential cost savings by using the [facility]?    3. Are you open to using the [facility] (Type [facility].    4. Which pharmacy/location (including street and city if local pharmacy) is medication to be sent to? [facility] - [place], [place] - [address]     5. Do they need a 30 day or 90 day supply? 90 days

## 2023-09-09 MED ORDER — RANOLAZINE ER 500 MG PO TB12: 500.0000 mg | ORAL_TABLET | Freq: Two times a day (BID) | ORAL | 1 refills | Status: DC

## 2023-09-09 NOTE — Telephone Encounter (Signed)
Pt's medication was sent to pt's pharmacy as requested. Confirmation received.  °

## 2023-09-16 DIAGNOSIS — E039 Hypothyroidism, unspecified: Secondary | ICD-10-CM | POA: Diagnosis not present

## 2023-09-16 DIAGNOSIS — E7800 Pure hypercholesterolemia, unspecified: Secondary | ICD-10-CM | POA: Diagnosis not present

## 2023-09-16 DIAGNOSIS — E0840 Diabetes mellitus due to underlying condition with diabetic neuropathy, unspecified: Secondary | ICD-10-CM | POA: Diagnosis not present

## 2023-09-16 DIAGNOSIS — E118 Type 2 diabetes mellitus with unspecified complications: Secondary | ICD-10-CM | POA: Diagnosis not present

## 2023-09-16 DIAGNOSIS — I10 Essential (primary) hypertension: Secondary | ICD-10-CM | POA: Diagnosis not present

## 2023-09-20 ENCOUNTER — Other Ambulatory Visit: Payer: Self-pay | Admitting: Cardiology

## 2023-09-20 DIAGNOSIS — R0609 Other forms of dyspnea: Secondary | ICD-10-CM

## 2023-10-15 ENCOUNTER — Encounter (HOSPITAL_COMMUNITY): Payer: Self-pay

## 2023-10-15 ENCOUNTER — Other Ambulatory Visit: Payer: Self-pay

## 2023-10-15 ENCOUNTER — Emergency Department (HOSPITAL_COMMUNITY): Payer: Medicare Other

## 2023-10-15 ENCOUNTER — Emergency Department (HOSPITAL_COMMUNITY)
Admission: EM | Admit: 2023-10-15 | Discharge: 2023-10-15 | Disposition: A | Discharge status: Home / Self Care | Payer: Medicare Other | Attending: Emergency Medicine | Admitting: Emergency Medicine

## 2023-10-15 DIAGNOSIS — Z79899 Other long term (current) drug therapy: Secondary | ICD-10-CM | POA: Diagnosis not present

## 2023-10-15 DIAGNOSIS — E039 Hypothyroidism, unspecified: Secondary | ICD-10-CM | POA: Insufficient documentation

## 2023-10-15 DIAGNOSIS — W19XXXA Unspecified fall, initial encounter: Secondary | ICD-10-CM

## 2023-10-15 DIAGNOSIS — J449 Chronic obstructive pulmonary disease, unspecified: Secondary | ICD-10-CM | POA: Diagnosis not present

## 2023-10-15 DIAGNOSIS — S0993XA Unspecified injury of face, initial encounter: Secondary | ICD-10-CM | POA: Diagnosis not present

## 2023-10-15 DIAGNOSIS — Z7984 Long term (current) use of oral hypoglycemic drugs: Secondary | ICD-10-CM | POA: Insufficient documentation

## 2023-10-15 DIAGNOSIS — H1131 Conjunctival hemorrhage, right eye: Secondary | ICD-10-CM | POA: Diagnosis not present

## 2023-10-15 DIAGNOSIS — S0083XA Contusion of other part of head, initial encounter: Secondary | ICD-10-CM

## 2023-10-15 DIAGNOSIS — S0990XA Unspecified injury of head, initial encounter: Secondary | ICD-10-CM | POA: Insufficient documentation

## 2023-10-15 DIAGNOSIS — E119 Type 2 diabetes mellitus without complications: Secondary | ICD-10-CM | POA: Diagnosis not present

## 2023-10-15 DIAGNOSIS — S0591XA Unspecified injury of right eye and orbit, initial encounter: Secondary | ICD-10-CM | POA: Diagnosis present

## 2023-10-15 DIAGNOSIS — W01198A Fall on same level from slipping, tripping and stumbling with subsequent striking against other object, initial encounter: Secondary | ICD-10-CM | POA: Diagnosis not present

## 2023-10-15 DIAGNOSIS — S199XXA Unspecified injury of neck, initial encounter: Secondary | ICD-10-CM | POA: Diagnosis not present

## 2023-10-15 DIAGNOSIS — Z7982 Long term (current) use of aspirin: Secondary | ICD-10-CM | POA: Insufficient documentation

## 2023-10-15 DIAGNOSIS — Z91040 Latex allergy status: Secondary | ICD-10-CM | POA: Insufficient documentation

## 2023-10-15 DIAGNOSIS — I672 Cerebral atherosclerosis: Secondary | ICD-10-CM | POA: Diagnosis not present

## 2023-10-15 DIAGNOSIS — M47813 Spondylosis without myelopathy or radiculopathy, cervicothoracic region: Secondary | ICD-10-CM | POA: Diagnosis not present

## 2023-10-15 DIAGNOSIS — M4802 Spinal stenosis, cervical region: Secondary | ICD-10-CM | POA: Diagnosis not present

## 2023-10-15 DIAGNOSIS — S0011XA Contusion of right eyelid and periocular area, initial encounter: Secondary | ICD-10-CM | POA: Diagnosis not present

## 2023-10-15 DIAGNOSIS — M47812 Spondylosis without myelopathy or radiculopathy, cervical region: Secondary | ICD-10-CM | POA: Diagnosis not present

## 2023-10-15 DIAGNOSIS — J45909 Unspecified asthma, uncomplicated: Secondary | ICD-10-CM | POA: Diagnosis not present

## 2023-10-15 MED ORDER — ACETAMINOPHEN 500 MG PO TABS
1000.0000 mg | ORAL_TABLET | Freq: Once | ORAL | Status: AC
  Administered 2023-10-15: 1000 mg via ORAL
  Filled 2023-10-15: qty 2

## 2023-10-15 NOTE — ED Provider Notes (Signed)
Accepted handoff at shift change from [facility]. Please see prior provider note for more detail.   Briefly: Patient is 72 y.o.   DDX: concern for Fall, eye trauma previously. Getting CT head, face, c-spine after fall last night.  Plan: I independently interpreted imaging including CT head without contrast, CT maxillofacial without contrast, and CT cervical spine without contrast which shows no evidence of acute abnormality, she has some facet arthropathy noted in the cervical spine, and some swelling around the eyes which they report could be related to thyroid disease.  Patient does have a history of hypothyroidism, takes Synthroid.  No additional treatment needed other than ibuprofen, Tylenol for aches, pains, patient is feeling okay at time of discharge and feels comfortable with going home, following up with her eye doctor. I agree with the radiologist interpretation.   Patient discharged in stable condition with return precautions and plan as above.   Olene Floss, PA-C 10/15/23 1558    Melene Plan, DO 10/15/23 1601

## 2023-10-15 NOTE — ED Triage Notes (Signed)
Last night pt tripped over bath mat and had a fall. Hit head on sink and has bruising around right eye and cheekbone. Pt is on blood thinner. Pt has headache. Denies dizziness, numbness, tinkling, or weakness. Fall happened at 2130 last night.

## 2023-10-15 NOTE — Discharge Instructions (Signed)
Please use Tylenol or ibuprofen for pain.  You may use 600 mg ibuprofen every 6 hours or 1000 mg of Tylenol every 6 hours.  You may choose to alternate between the 2.  This would be most effective.  Not to exceed 4 g of Tylenol within 24 hours.  Not to exceed 3200 mg ibuprofen 24 hours.  Please follow-up with your eye doctor as they had requested, otherwise I expect that you may have some soreness, or some minor headaches for the next few days after the fall, but no acute findings noted in the ED, the pain medication that described above should be sufficient to help with your symptoms.  Please return if you have concern for significantly worsening vision, new numbness, tingling, severe headache that does not respond to medication at home.

## 2023-10-15 NOTE — ED Provider Notes (Signed)
[facility] EMERGENC[facility] DEPARTMENT AT [facility] Provider Note   CSN: [id number] Arrival date & time: 10/15/23  1238     History  Chief Complaint  Patient presents with   Jessica [name] is a 72 y.o. female with PMHx asthma, cervical stenosis of spine, COPD, DM, DDD, GERD, HLD, hypothyroidism, migraines, RA who presents to ED after fall last night.  Patient stating that she slipped and fell and hit her head on the sink countertop.  Patient endorses blood thinners.  Denies LOC, seizures, vision changes.  Denies dizziness, numbness/paresthesias, weakness.  Patient ambulatory without complaint. Patient was referred by her PCP to come to the ED for imaging today.  Denies fever, chest pain, dyspnea, cough, nausea, vomiting, diarrhea, dysuria, hematuria.    Fall       Home Medications Prior to Admission medications   Medication Sig Start Date End Date Taking? Authorizing Provider  acetaminophen (TYLENOL) 500 MG tablet Take 500 mg by mouth every 6 (six) hours as needed for moderate pain or headache.    [provider]  ALPRAZolam Prudy Feeler) 0.5 MG tablet Take 0.5 mg by mouth 3 (three) times daily as needed. 08/11/22   [provider]  aspirin (ASPIRIN CHILDRENS) 81 MG chewable tablet Chew 1 tablet (81 mg total) by mouth daily. 03/26/21   Yates Decamp, MD  butalbital-acetaminophen-caffeine (FIORICET, ESGIC) [phone] MG per tablet Take 1 tablet by mouth 3 (three) times daily as needed for headache or migraine.     [provider]  cholestyramine (QUESTRAN) 4 g packet Take 4 g by mouth in the morning and at bedtime. 03/29/20   [provider]  dicyclomine (BENTYL) 20 MG tablet Take 20 mg by mouth 2 (two) times daily. 01/26/20   [provider]  DULoxetine (CYMBALTA) 20 MG capsule Take 20 mg by mouth daily. 09/05/22   [provider]  ergocalciferol (VITAMIN D2) 50000 UNITS capsule Take 50,000 Units by mouth every Thursday.      [provider]  escitalopram (LEXAPRO) 20 MG tablet Take 20 mg by mouth daily. 12/27/20   [provider]  estradiol (ESTRACE) 2 MG tablet Take 2 mg by mouth daily. 01/26/20   [provider]  ezetimibe (ZETIA) 10 MG tablet TAKE 1 TABLET BY MOUTH ONCE DAILY IN THE EVENING 02/24/23   Yates Decamp, MD  gabapentin (NEURONTIN) 300 MG capsule Take 300 mg by mouth 2 (two) times daily.    [provider]  glimepiride (AMARYL) 4 MG tablet Take 4 mg by mouth in the morning and at bedtime.     [provider]  HYDROcodone-acetaminophen (NORCO/VICODIN) 5-325 MG tablet Take 1 tablet by mouth at bedtime. 01/12/21   [provider]  levothyroxine (SYNTHROID) 150 MCG tablet Take 150 mcg by mouth daily. 01/12/21   [provider]  loperamide (IMODIUM A-D) 2 MG tablet Take 4 mg by mouth 3 (three) times daily as needed for diarrhea or loose stools.    [provider]  losartan (COZAAR) 25 MG tablet Take 1 tablet by mouth in the evening 05/29/23   Yates Decamp, MD  metoprolol tartrate (LOPRESSOR) 25 MG tablet Take 1 tablet by mouth twice daily 09/22/23   Yates Decamp, MD  Multiple Vitamin (MULTIVITAMIN) tablet Take 1 tablet by mouth daily.    [provider]  nitroGLYCERIN (NITROSTAT) 0.4 MG SL tablet Place 1 tablet (0.4 mg total) under the tongue every 5 (five) minutes as needed for chest pain. 02/15/20  09/05/22  Patwardhan, Anabel Bene, MD  omeprazole (PRILOSEC) 20 MG capsule Take 20 mg by mouth daily. 05/21/21   [provider]  pioglitazone (ACTOS) 15 MG tablet Take 15 mg by mouth daily.    [provider]  Potassium Chloride ER 20 MEQ TBCR Take 20 mEq by mouth 2 (two) times daily.  01/07/20   [provider]  ranolazine (RANEXA) 500 MG 12 hr tablet Take 1 tablet (500 mg total) by mouth 2 (two) times daily. 09/09/23   Yates Decamp, MD  rosuvastatin (CRESTOR) 20 MG tablet Take 1 tablet (20 mg total) by mouth daily. 06/18/21 06/13/22   Yates Decamp, MD      Allergies    Atorvastatin, Metformin hcl, Adhesive [tape], Latex, Oxycodone, and Remicade [infliximab]    Review of Systems   Review of Systems  Musculoskeletal:        Fall    Physical Exam Updated Vital Signs BP 119/75   Pulse 71   Temp 98.2 F (36.8 C) (Oral)   Resp 20   Ht 5\' 6"  (1.676 m)   Wt 96.6 kg   SpO2 95%   BMI 34.38 kg/m  Physical Exam Vitals and nursing note reviewed.  Constitutional:      General: She is not in acute distress.    Appearance: She is not ill-appearing or toxic-appearing.  HENT:     Head: Normocephalic and atraumatic.     Comments: Small bruise under right eye. EOM intact. Mild tenderness to palpation just below right eye. Eyes:     General: No scleral icterus.       Right eye: No discharge.        Left eye: No discharge.     Conjunctiva/sclera: Conjunctivae normal.  Cardiovascular:     Rate and Rhythm: Normal rate.  Pulmonary:     Effort: Pulmonary effort is normal.  Abdominal:     General: Abdomen is flat.  Musculoskeletal:     Comments: No tenderness to palpation of BL shoulders, elbows, wrists/hands, hips, knees, ankles.  Skin:    General: Skin is warm and dry.  Neurological:     General: No focal deficit present.     Mental Status: She is alert. Mental status is at baseline.     Comments: GCS 15. Speech is goal oriented. No deficits appreciated to CN III-XII; symmetric eyebrow raise, no facial drooping, tongue midline. Patient has equal grip strength bilaterally with 5/5 strength against resistance in all major muscle groups bilaterally. Sensation to light touch intact. Patient moves extremities without ataxia. Patient ambulatory with steady gait.   Psychiatric:        Mood and Affect: Mood normal.        Behavior: Behavior normal.     ED Results / Procedures / Treatments   Labs (all labs ordered are listed, but only abnormal results are displayed) Labs Reviewed - No data to  display  EKG None  Radiology No results found.  Procedures Procedures    Medications Ordered in ED Medications  acetaminophen (TYLENOL) tablet 1,000 mg (has no administration in time range)    ED Course/ Medical Decision Making/ A&P                                 Medical Decision Making Amount and/or Complexity of Data Reviewed Radiology: ordered.  Risk OTC drugs.   This patient presents to the ED after a fall, this involves  an extensive number of treatment options, and is a complaint that carries with it a high risk of complications and morbidity.  The differential diagnosis includes  intracranial hemorrhage, subdural/epidural hematoma, vertebral fracture, spinal cord injury, muscle strain, skull fracture, fracture.   Co morbidities that complicate the patient evaluation   asthma, cervical stenosis of spine, COPD, DM, DDD, GERD, HLD, hypothyroidism, migraines, RA   Additional history obtained:  Dr. Thomasena Edis PCP   Imaging Studies ordered:  I ordered imaging studies including  -CT head/maxillofacial/cervical spine: Assess for complications from patient's fall I independently visualized and interpreted imaging  I agree with the radiologist interpretation   Problem List / ED Course / Critical interventions / Medication management  Patient presented for mechanical fall. Patient with stable vitals and does not appear to be in distress.  Patient denying any infectious symptoms today.  Patient afebrile with stable vitals.  There is some mild bruising and tenderness to palpation just below right eye.  Rest of physical/neuroexam unremarkable. I have reviewed the patients home medicines and have made adjustments as needed    Social Determinants of Health:  none   3:39 PM Care of Angla A Buffin transferred to PA Prosperi at the end of my shift as the patient will require reassessment once labs/imaging have resulted. Patient presentation, ED course, and plan of care  discussed with review of all pertinent labs and imaging. Please see his/her note for further details regarding further ED course and disposition. Plan at time of handoff is reassess patient after CT imaging results.  If imaging is reassuring, I expect patient to be eligible for outpatient follow-up.. This [name] be altered or completely changed at the discretion of the oncoming team pending results of further workup.           Final Clinical Impression(s) / ED Diagnoses Final diagnoses:  None    Rx / DC Orders ED Discharge Orders     None         Dorthy Cooler, [place] 10/15/23 1542    Glyn Ade, MD 10/16/23 0710

## 2023-11-04 ENCOUNTER — Telehealth: Payer: Self-pay

## 2023-11-04 NOTE — Telephone Encounter (Signed)
Transition Care Management Follow-up Telephone Call Date of discharge and from where: 10/15/2023 [facility] How have you been since you were released from the hospital? Patient stated she is feeling better but still has some soreness. Any questions or concerns? No  Items Reviewed: Did the pt receive and understand the discharge instructions provided? Yes  Medications obtained and verified?  Patient was able to obtain OTC meds. Other? No  Any new allergies since your discharge? No  Dietary orders reviewed? Yes Do you have support at home? Yes   Follow up appointments reviewed:  PCP Hospital f/u appt confirmed? No  Scheduled to see  on  @ . Specialist Hospital f/u appt confirmed? No  Scheduled to see  on  @ . Are transportation arrangements needed? No  If their condition worsens, is the pt aware to call PCP or go to the Emergency Dept.? Yes Was the patient provided with contact information for the PCP's office or ED? Yes Was to pt encouraged to call back with questions or concerns? Yes   Jamarea Selner [facility]  [facility] Guide Direct Dial: [phone]  Website: [email or web address]

## 2024-01-01 DIAGNOSIS — E0840 Diabetes mellitus due to underlying condition with diabetic neuropathy, unspecified: Secondary | ICD-10-CM | POA: Diagnosis not present

## 2024-01-01 DIAGNOSIS — I10 Essential (primary) hypertension: Secondary | ICD-10-CM | POA: Diagnosis not present

## 2024-01-01 DIAGNOSIS — M0609 Rheumatoid arthritis without rheumatoid factor, multiple sites: Secondary | ICD-10-CM | POA: Diagnosis not present

## 2024-01-01 DIAGNOSIS — M47816 Spondylosis without myelopathy or radiculopathy, lumbar region: Secondary | ICD-10-CM | POA: Diagnosis not present

## 2024-01-01 DIAGNOSIS — Z955 Presence of coronary angioplasty implant and graft: Secondary | ICD-10-CM | POA: Diagnosis not present

## 2024-01-01 DIAGNOSIS — F419 Anxiety disorder, unspecified: Secondary | ICD-10-CM | POA: Diagnosis not present

## 2024-01-01 DIAGNOSIS — E7800 Pure hypercholesterolemia, unspecified: Secondary | ICD-10-CM | POA: Diagnosis not present

## 2024-01-01 DIAGNOSIS — E559 Vitamin D deficiency, unspecified: Secondary | ICD-10-CM | POA: Diagnosis not present

## 2024-01-01 DIAGNOSIS — G43909 Migraine, unspecified, not intractable, without status migrainosus: Secondary | ICD-10-CM | POA: Diagnosis not present

## 2024-01-01 DIAGNOSIS — E039 Hypothyroidism, unspecified: Secondary | ICD-10-CM | POA: Diagnosis not present

## 2024-01-01 DIAGNOSIS — Z79899 Other long term (current) drug therapy: Secondary | ICD-10-CM | POA: Diagnosis not present

## 2024-02-03 ENCOUNTER — Ambulatory Visit: Payer: Medicare Other | Attending: Cardiology | Admitting: Cardiology

## 2024-02-06 ENCOUNTER — Encounter: Payer: Self-pay | Admitting: Cardiology

## 2024-02-28 ENCOUNTER — Other Ambulatory Visit: Payer: Self-pay | Admitting: Cardiology

## 2024-02-28 DIAGNOSIS — I25118 Atherosclerotic heart disease of native coronary artery with other forms of angina pectoris: Secondary | ICD-10-CM

## 2024-03-11 ENCOUNTER — Other Ambulatory Visit: Payer: Self-pay | Admitting: Cardiology

## 2024-03-11 DIAGNOSIS — E782 Mixed hyperlipidemia: Secondary | ICD-10-CM

## 2024-03-20 ENCOUNTER — Other Ambulatory Visit: Payer: Self-pay | Admitting: Cardiology

## 2024-03-20 DIAGNOSIS — R0609 Other forms of dyspnea: Secondary | ICD-10-CM

## 2024-03-30 ENCOUNTER — Other Ambulatory Visit: Payer: Self-pay

## 2024-03-30 ENCOUNTER — Other Ambulatory Visit: Payer: Self-pay | Admitting: Cardiology

## 2024-03-30 DIAGNOSIS — I25118 Atherosclerotic heart disease of native coronary artery with other forms of angina pectoris: Secondary | ICD-10-CM

## 2024-03-30 DIAGNOSIS — E782 Mixed hyperlipidemia: Secondary | ICD-10-CM

## 2024-03-30 MED ORDER — RANOLAZINE ER 500 MG PO TB12
500.0000 mg | ORAL_TABLET | Freq: Two times a day (BID) | ORAL | 0 refills | Status: DC
Start: 2024-03-30 — End: 2024-05-07

## 2024-04-15 DIAGNOSIS — Z79899 Other long term (current) drug therapy: Secondary | ICD-10-CM | POA: Diagnosis not present

## 2024-04-15 DIAGNOSIS — M0609 Rheumatoid arthritis without rheumatoid factor, multiple sites: Secondary | ICD-10-CM | POA: Diagnosis not present

## 2024-04-15 DIAGNOSIS — E039 Hypothyroidism, unspecified: Secondary | ICD-10-CM | POA: Diagnosis not present

## 2024-04-15 DIAGNOSIS — F419 Anxiety disorder, unspecified: Secondary | ICD-10-CM | POA: Diagnosis not present

## 2024-04-15 DIAGNOSIS — E7800 Pure hypercholesterolemia, unspecified: Secondary | ICD-10-CM | POA: Diagnosis not present

## 2024-04-15 DIAGNOSIS — E0840 Diabetes mellitus due to underlying condition with diabetic neuropathy, unspecified: Secondary | ICD-10-CM | POA: Diagnosis not present

## 2024-04-15 DIAGNOSIS — E559 Vitamin D deficiency, unspecified: Secondary | ICD-10-CM | POA: Diagnosis not present

## 2024-04-15 DIAGNOSIS — I10 Essential (primary) hypertension: Secondary | ICD-10-CM | POA: Diagnosis not present

## 2024-04-20 ENCOUNTER — Telehealth: Payer: Self-pay | Admitting: Cardiology

## 2024-04-20 ENCOUNTER — Other Ambulatory Visit: Payer: Self-pay | Admitting: Cardiology

## 2024-04-20 DIAGNOSIS — R0609 Other forms of dyspnea: Secondary | ICD-10-CM

## 2024-04-20 MED ORDER — METOPROLOL TARTRATE 25 MG PO TABS: 25.0000 mg | ORAL_TABLET | Freq: Two times a day (BID) | ORAL | 0 refills | Status: DC

## 2024-04-20 NOTE — Telephone Encounter (Signed)
Refills has been sent to he pharmacy.

## 2024-04-20 NOTE — Telephone Encounter (Signed)
 *  STAT* If patient is at the pharmacy, call can be transferred to refill team.   1. Which medications need to be refilled? (please list name of each medication and dose if known)   metoprolol  tartrate (LOPRESSOR ) 25 MG tablet   2. Which pharmacy/location (including street and city if local pharmacy) is medication to be sent to?   [facility] - [place], [place] - [address]    3. Do they need a 30 day or 90 day supply? 30  Patient is scheduled to see Katlyn West on 05/12/24

## 2024-04-22 DIAGNOSIS — E7800 Pure hypercholesterolemia, unspecified: Secondary | ICD-10-CM | POA: Diagnosis not present

## 2024-04-22 DIAGNOSIS — M0609 Rheumatoid arthritis without rheumatoid factor, multiple sites: Secondary | ICD-10-CM | POA: Diagnosis not present

## 2024-04-22 DIAGNOSIS — I2510 Atherosclerotic heart disease of native coronary artery without angina pectoris: Secondary | ICD-10-CM | POA: Diagnosis not present

## 2024-04-22 DIAGNOSIS — N1831 Chronic kidney disease, stage 3a: Secondary | ICD-10-CM | POA: Diagnosis not present

## 2024-04-22 DIAGNOSIS — E039 Hypothyroidism, unspecified: Secondary | ICD-10-CM | POA: Diagnosis not present

## 2024-04-22 DIAGNOSIS — Z0000 Encounter for general adult medical examination without abnormal findings: Secondary | ICD-10-CM | POA: Diagnosis not present

## 2024-04-22 DIAGNOSIS — E1159 Type 2 diabetes mellitus with other circulatory complications: Secondary | ICD-10-CM | POA: Diagnosis not present

## 2024-04-22 DIAGNOSIS — E1142 Type 2 diabetes mellitus with diabetic polyneuropathy: Secondary | ICD-10-CM | POA: Diagnosis not present

## 2024-04-22 DIAGNOSIS — E1122 Type 2 diabetes mellitus with diabetic chronic kidney disease: Secondary | ICD-10-CM | POA: Diagnosis not present

## 2024-04-22 DIAGNOSIS — I129 Hypertensive chronic kidney disease with stage 1 through stage 4 chronic kidney disease, or unspecified chronic kidney disease: Secondary | ICD-10-CM | POA: Diagnosis not present

## 2024-04-22 DIAGNOSIS — B372 Candidiasis of skin and nail: Secondary | ICD-10-CM | POA: Diagnosis not present

## 2024-05-07 ENCOUNTER — Telehealth: Payer: Self-pay | Admitting: Cardiology

## 2024-05-07 DIAGNOSIS — E782 Mixed hyperlipidemia: Secondary | ICD-10-CM

## 2024-05-07 DIAGNOSIS — I25118 Atherosclerotic heart disease of native coronary artery with other forms of angina pectoris: Secondary | ICD-10-CM

## 2024-05-07 MED ORDER — EZETIMIBE 10 MG PO TABS: 10.0000 mg | ORAL_TABLET | Freq: Every evening | ORAL | 0 refills | Status: DC

## 2024-05-07 MED ORDER — RANOLAZINE ER 500 MG PO TB12
500.0000 mg | ORAL_TABLET | Freq: Two times a day (BID) | ORAL | 0 refills | Status: AC
Start: 2024-05-07 — End: ?

## 2024-05-07 NOTE — Telephone Encounter (Signed)
 RX sent to requested Pharmacy

## 2024-05-07 NOTE — Telephone Encounter (Signed)
*  STAT* If patient is at the pharmacy, call can be transferred to refill team.   1. Which medications need to be refilled? (please list name of each medication and dose if known) ranolazine  (RANEXA ) 500 MG 12 hr tablet   ezetimibe  (ZETIA ) 10 MG tablet    4. Which pharmacy/location (including street and city if local pharmacy) is medication to be sent to?  [facility] - [place], [place] - [address]     5. Do they need a 30 day or 90 day supply? 90  Scheduled for 6/11

## 2024-05-09 NOTE — Progress Notes (Unsigned)
 Cardiology Office Note    Date:  05/09/2024  ID:  AHNYLA MENDEL, DOB 1951-01-21, MRN [id number] PCP:  Pete Brand, DO  Cardiologist:  None  Electrophysiologist:  None   Chief Complaint: ***  History of Present Illness: .    MALEEHA HALLS is a 73 y.o. female with visit-pertinent history of CAD, hypertension, hyperlipidemia, diabetes mellitus with peripheral neuropathy and tobacco use.  Patient with history of coronary artery disease s/p stenting to the ostial RCA on 04/18/2020 and has moderate proximal and mid LAD diffuse disease treated medically.  Patient was last seen in clinic by Dr. Berry Bristol on 09/05/2022.  She reported that over the prior 2 months she had had recurrent episodes of near syncope when getting up from her sofa to walk to the bathroom, halfway down the hallway she feels as though she is going to pass out.  She had 1 episode of falling to the ground about 2 weeks prior to appointment.  She also noted worsening dyspnea on exertion even with walking into the house.  Echocardiogram and Lexiscan  nuclear stress test were ordered for reevaluation.  Echocardiogram on 09/24/2022 indicated normal LV systolic function with visual EF 55 to 60%, LV cavity normal in size, mild concentric hypertrophy of the left ventricle, normal wall motion, G1 DD, mild mitral regurgitation.  MPI on 09/24/2022 showed normal myocardial perfusion, stress LVEF was mildly dysfunctional at 42%, possibly underestimated due to frequent PVCs which were noted during study.  Today she presents for follow-up.  She reports that she  CAD: HTN: HLD: Tobacco use:   Labwork independently reviewed:   ROS: .   *** denies chest pain, shortness of breath, lower extremity edema, fatigue, palpitations, melena, hematuria, hemoptysis, diaphoresis, weakness, presyncope, syncope, orthopnea, and PND.  All other systems are reviewed and otherwise negative.  Studies Reviewed: Aaron Aas    EKG:  EKG is ordered today, personally reviewed,  demonstrating ***     CV Studies: Cardiac studies reviewed are outlined and summarized above. Otherwise please see EMR for full report. Cardiac Studies & Procedures   ______________________________________________________________________________________________ CARDIAC CATHETERIZATION  CARDIAC CATHETERIZATION 04/18/2020  Conclusion Coronary angioplasty 04/18/20 Ost RCA to Prox RCA lesion is 80% stenosed.  SP CSI orbital atherectomy with Diamondback followed by  STENT RESOLUTE ONYX 3.5X22.  Stenosis reduced to 0%.  Rec: Continue aspirin  indefinitely and Plavix  for 6 months, can be discharged home today with outpatient follow-up.  Findings Coronary Findings Diagnostic  Dominance: Right  Right Coronary Artery Ost RCA to Prox RCA lesion is 80% stenosed. The lesion is concentric. The lesion is moderately calcified. The lesion was previously treated.  Intervention  Ost RCA to Prox RCA lesion Atherectomy CATH LAUNCHER 6FR HS SH guide catheter was inserted. WIRE VIPERWIRE COR FLEX TIP guidewire was used to cross lesion. Orbital atherectomy was performed using a CROWN DIAMONDBACK CLASSIC 1.25. 4 passes taken. Stent Lesion length:  20 mm. Pre-stent angioplasty was performed using a BALLOON SAPPHIRE 2.5X10. Maximum pressure:  12 atm. Inflation time:  60 sec. A drug-eluting stent was successfully placed using a STENT RESOLUTE ONYX 3.5X22. Maximum pressure: 12 atm. Inflation time: 60 sec. Stent strut is well apposed. Post-stent angioplasty was performed. Maximum pressure:  12 atm. Stent balloon into the ostium of RCA Post-Intervention Lesion Assessment The intervention was successful. Pre-interventional TIMI flow is 3. Post-intervention TIMI flow is 3. No complications occurred at this lesion. There is a 0% residual stenosis post intervention.   CARDIAC CATHETERIZATION  CARDIAC CATHETERIZATION 02/29/2020  Conclusion  Partially successful PTCA to ostial RCA 60% residual, severe calcific  stenosis in ostial RCA.  Coronary angiogram (02/15/2020): LM: Short, normal. LAD: Small to medium caliber vessel with prox-mid, eccentric 80% stenosis with moderate calcification. Occluded calcific Diag 1, likely originating at the site of LAD stenosis. Under filled apical LAD Left-to-left (LCx-LAD/Diag) and right-to-left (RCA-LAD/Diag) collaterals LCx: Large vessel. Prox 30% stenosis.  Recommend further discussion regarding options of two-vessel CABG or another attempt by another interventionalist.  Cody Das, MD [facility] Pager: [phone] Office: [phone]  Findings Coronary Findings Diagnostic  Dominance: Right  Right Coronary Artery Ost RCA to Prox RCA lesion is 80% stenosed. The lesion is eccentric. The lesion is moderately calcified.  Intervention  Ost RCA to Prox RCA lesion Angioplasty Balloon angioplasty was performed using a BALLOON EUPHORA RX 2.0X6. Anticipating difficulty engaging right coronary artery due to ostial location and tortuosity, I placed a 6 French long 35 cm sheath for better backup.  Even then, I had difficulty engaging the right coronary artery in spite of using multiple different guide catheters, including 6 [place] JR4, 6 [place] AR-1, 6 [place] no torque, 6 [place] 3 DRC.  Ultimately, I was able to engage the coronary artery using 6 French JL 0.75 guide.  This guide was rather deep-seated.  Initially, my intention was to perform orbital atherectomy.  Therefore, I used a Health and safety inspector.  However, I had difficulty wiring main RCA, given lack of coaxial engagement with the guide.  Finally, I was able to use Viper wire and telescope as anchor in a conus branch.  I was able to wire the main RCA with cougar wire.  I then attempted performed using 2.0 x 6 mm balloon.  At this point, I performed IVUS, which showed distal reference vessel of 3 mm with severe eccentric calcific stenosis ranging from 150 degree to to 70 degree in proximal to  ostial RCA.  Given the ostial involvement, I realized that orbital atherectomy would be suboptimal.  I decided to use Cutting Balloon.  By this time, I had already used 150 cc contrast and 45 minutes of fluoroscopy time.  Before I could advance Cutting Balloon in ostial RCA, I noticed disengagement of guide with looped cougar wire. I suspect this occurred due to tenuous guide engagement at baseline, and movement of the guide with respiration. I was not able to salvage the wire and guide position again.  At this point, I felt that any further attempts would lead to more radiation and contrast use, with very real possibility of inadequate lesion preparation with Cutting Balloon angioplasty, eventually leading to either an underexpanded stent or worse complications leading to dissection etc.  Therefore, I decided to abort the procedure after unsuccessful intervention attempt. I obtained final angiogram using 5 Fr JR 4 diagnostic catheter, that showed no dissection/perforation with intact flow in entire RCA. After limited PTCA, there is definite increase in the apparent size of an underfilled RCA.  Patient was noted to have small hematoma after Mynx closure that improved after additional manual pressure for 15 min. No residual hematoma was noted.   I will monitor the patient overnight. Post-Intervention Lesion Assessment The intervention was unsuccessful due to lesion rigidity. Pre-interventional TIMI flow is 2. Post-intervention TIMI flow is 3. No complications occurred at this lesion. Ultrasound (IVUS) was performed on the lesion post PCI. Severe plaque burden detected. Lesion characteristics:  calcified. There is a 60% residual stenosis post intervention.   STRESS TESTS  PCV MYOCARDIAL PERFUSION WITH LEXISCAN   09/24/2022  Narrative Lexiscan  Sestamibi stress test 09/24/2022: Lexiscan  nuclear stress test performed using 1-day protocol. Stress EKG is non-diagnostic, as this is pharmacological stress test. In  addition, rest and stress EKG showed sinus rhythm, frequent PVC's, nonspecific ST-T abnormality. Normal myocardial perfusion. Stress LV EF is mildly dysfunctional 42%, possibly underestimated due to frequent PVC's. Intermediate risk study. No significant change compared to previous study in 2021.   ECHOCARDIOGRAM  PCV ECHOCARDIOGRAM COMPLETE 09/24/2022  Narrative Echocardiogram 09/24/2022: Normal LV systolic function with visual EF 55-60%. Left ventricle cavity is normal in size. Mild concentric hypertrophy of the left ventricle. Normal global wall motion. Doppler evidence of grade I (impaired) diastolic dysfunction, normal LAP. Calculated EF 59%. Structurally normal mitral valve.  Mild (Grade I) mitral regurgitation. Structurally normal tricuspid valve with trace regurgitation. No evidence of pulmonary hypertension. No significant change compared to 07/2021.    MONITORS  CARDIAC EVENT MONITOR 02/17/2020  Narrative 24 hr Holter monitor 02/11/2020: Dominant rhythm sinus. HR 53-122 bpm. Avg HR 74 bpm. Frequent PVC's in the form of isolated VE, bigeminy, trigeminy, rare NSVT up to 7 beats. Ventricular ectopy 24.6% Rare supraventricular ectopy. No atrial fibrillation/atrial flutter/SVT/VT/high grade AV block, sinus pause >3sec noted. Symptoms reported: None       ______________________________________________________________________________________________       Current Reported Medications:.    No outpatient medications have been marked as taking for the 05/12/24 encounter (Appointment) with Josehua Hammar D, NP.    Physical Exam:    VS:  There were no vitals taken for this visit.   Wt Readings from Last 3 Encounters:  10/15/23 213 lb (96.6 kg)  09/05/22 213 lb 3.2 oz (96.7 kg)  06/18/21 188 lb 9.6 oz (85.5 kg)    GEN: Well nourished, well developed in no acute distress NECK: No JVD; No carotid bruits CARDIAC: ***RRR, no murmurs, rubs, gallops RESPIRATORY:  Clear to  auscultation without rales, wheezing or rhonchi  ABDOMEN: Soft, non-tender, non-distended EXTREMITIES:  No edema; No acute deformity     Asessement and Plan:.     ***     Disposition: F/u with ***  Signed, Mamie Hundertmark D Khaza Blansett, NP

## 2024-05-12 ENCOUNTER — Ambulatory Visit: Admitting: Cardiology

## 2024-05-12 ENCOUNTER — Encounter: Payer: Self-pay | Admitting: Cardiology

## 2024-05-12 ENCOUNTER — Ambulatory Visit: Attending: Cardiology | Admitting: Cardiology

## 2024-05-12 VITALS — BP 106/68 | HR 64 | Ht 66.0 in | Wt 220.6 lb

## 2024-05-12 DIAGNOSIS — I25118 Atherosclerotic heart disease of native coronary artery with other forms of angina pectoris: Secondary | ICD-10-CM | POA: Diagnosis not present

## 2024-05-12 DIAGNOSIS — E782 Mixed hyperlipidemia: Secondary | ICD-10-CM | POA: Diagnosis not present

## 2024-05-12 DIAGNOSIS — I10 Essential (primary) hypertension: Secondary | ICD-10-CM | POA: Diagnosis not present

## 2024-05-12 DIAGNOSIS — R0609 Other forms of dyspnea: Secondary | ICD-10-CM | POA: Insufficient documentation

## 2024-05-12 MED ORDER — LOSARTAN POTASSIUM 25 MG PO TABS: 12.5000 mg | ORAL_TABLET | Freq: Every evening | ORAL | 3 refills | Status: DC

## 2024-05-12 NOTE — Patient Instructions (Signed)
 Medication Instructions:  Decrease Losartan  12.5 mg once a day  Restart Aspirin  81 mg once a day  Check to see if you are still taking Crestor  or Rosuvastatin  *If you need a refill on your cardiac medications before your next appointment, please call your pharmacy*  Lab Work: No labs If you have labs (blood work) drawn today and your tests are completely normal, you will receive your results only by: MyChart Message (if you have MyChart) OR A paper copy in the mail If you have any lab test that is abnormal or we need to change your treatment, we will call you to review the results.  Testing/Procedures: No testing  Follow-Up: At [facility], you and your health needs are our priority.  As part of our continuing mission to provide you with exceptional heart care, our providers are all part of one team.  This team includes your primary Cardiologist (physician) and Advanced Practice Providers or APPs (Physician Assistants and Nurse Practitioners) who all work together to provide you with the care you need, when you need it.  Your next appointment:   3-4 month(s)  Provider:   Knox Perl, MD    We recommend signing up for the patient portal called MyChart.  Sign up information is provided on this After Visit Summary.  MyChart is used to connect with patients for Virtual Visits (Telemedicine).  Patients are able to view lab/test results, encounter notes, upcoming appointments, etc.  Non-urgent messages can be sent to your provider as well.   To learn more about what you can do with MyChart, go to [email or web address].   Other Instructions Your physician has requested that you have an echocardiogram. Echocardiography is a painless test that uses sound waves to create images of your heart. It provides your doctor with information about the size and shape of your heart and how well your heart's chambers and valves are working. This procedure takes approximately one hour. There are no  restrictions for this procedure. Please do NOT wear cologne, perfume, aftershave, or lotions (deodorant is allowed). Please arrive 15 minutes prior to your appointment time.  Please note: We ask at that you not bring children with you during ultrasound (echo/ vascular) testing. Due to room size and safety concerns, children are not allowed in the ultrasound rooms during exams. Our front office staff cannot provide observation of children in our lobby area while testing is being conducted. An adult accompanying a patient to their appointment will only be allowed in the ultrasound room at the discretion of the ultrasound technician under special circumstances. We apologize for any inconvenience.

## 2024-05-15 ENCOUNTER — Other Ambulatory Visit: Payer: Self-pay | Admitting: Cardiology

## 2024-05-15 DIAGNOSIS — R0609 Other forms of dyspnea: Secondary | ICD-10-CM

## 2024-05-18 ENCOUNTER — Other Ambulatory Visit: Payer: Self-pay | Admitting: Cardiology

## 2024-05-18 DIAGNOSIS — I25118 Atherosclerotic heart disease of native coronary artery with other forms of angina pectoris: Secondary | ICD-10-CM

## 2024-06-11 ENCOUNTER — Other Ambulatory Visit: Payer: Self-pay

## 2024-06-11 DIAGNOSIS — E782 Mixed hyperlipidemia: Secondary | ICD-10-CM

## 2024-06-11 MED ORDER — EZETIMIBE 10 MG PO TABS: 10.0000 mg | ORAL_TABLET | Freq: Every evening | ORAL | 3 refills | Status: AC

## 2024-07-13 ENCOUNTER — Other Ambulatory Visit: Payer: Self-pay | Admitting: Cardiology

## 2024-07-13 DIAGNOSIS — I10 Essential (primary) hypertension: Secondary | ICD-10-CM

## 2024-07-20 DIAGNOSIS — E7800 Pure hypercholesterolemia, unspecified: Secondary | ICD-10-CM | POA: Diagnosis not present

## 2024-07-20 DIAGNOSIS — E039 Hypothyroidism, unspecified: Secondary | ICD-10-CM | POA: Diagnosis not present

## 2024-07-20 DIAGNOSIS — N1831 Chronic kidney disease, stage 3a: Secondary | ICD-10-CM | POA: Diagnosis not present

## 2024-07-20 DIAGNOSIS — E1159 Type 2 diabetes mellitus with other circulatory complications: Secondary | ICD-10-CM | POA: Diagnosis not present

## 2024-07-20 DIAGNOSIS — E1122 Type 2 diabetes mellitus with diabetic chronic kidney disease: Secondary | ICD-10-CM | POA: Diagnosis not present

## 2024-07-20 DIAGNOSIS — I129 Hypertensive chronic kidney disease with stage 1 through stage 4 chronic kidney disease, or unspecified chronic kidney disease: Secondary | ICD-10-CM | POA: Diagnosis not present

## 2024-07-27 DIAGNOSIS — E7800 Pure hypercholesterolemia, unspecified: Secondary | ICD-10-CM | POA: Diagnosis not present

## 2024-07-27 DIAGNOSIS — E1159 Type 2 diabetes mellitus with other circulatory complications: Secondary | ICD-10-CM | POA: Diagnosis not present

## 2024-07-27 DIAGNOSIS — F4323 Adjustment disorder with mixed anxiety and depressed mood: Secondary | ICD-10-CM | POA: Diagnosis not present

## 2024-07-27 DIAGNOSIS — Z955 Presence of coronary angioplasty implant and graft: Secondary | ICD-10-CM | POA: Diagnosis not present

## 2024-07-27 DIAGNOSIS — E0840 Diabetes mellitus due to underlying condition with diabetic neuropathy, unspecified: Secondary | ICD-10-CM | POA: Diagnosis not present

## 2024-07-27 DIAGNOSIS — E1122 Type 2 diabetes mellitus with diabetic chronic kidney disease: Secondary | ICD-10-CM | POA: Diagnosis not present

## 2024-07-27 DIAGNOSIS — I129 Hypertensive chronic kidney disease with stage 1 through stage 4 chronic kidney disease, or unspecified chronic kidney disease: Secondary | ICD-10-CM | POA: Diagnosis not present

## 2024-07-27 DIAGNOSIS — N1831 Chronic kidney disease, stage 3a: Secondary | ICD-10-CM | POA: Diagnosis not present

## 2024-07-27 DIAGNOSIS — E039 Hypothyroidism, unspecified: Secondary | ICD-10-CM | POA: Diagnosis not present

## 2024-08-13 ENCOUNTER — Ambulatory Visit: Attending: Cardiology | Admitting: Cardiology

## 2024-12-29 ENCOUNTER — Ambulatory Visit: Admitting: Podiatry

## 2025-01-11 ENCOUNTER — Ambulatory Visit: Admitting: Podiatry
